# Patient Record
Sex: Male | Born: 1950 | Race: White | Hispanic: No | Marital: Single | Smoking: Never smoker
Health system: Southern US, Community
[De-identification: ages and names within clinical notes are randomized; demographics above are authoritative.]

## PROBLEM LIST (undated history)

## (undated) DIAGNOSIS — I10 Essential (primary) hypertension: Secondary | ICD-10-CM

## (undated) DIAGNOSIS — C61 Malignant neoplasm of prostate: Secondary | ICD-10-CM

## (undated) DIAGNOSIS — F329 Major depressive disorder, single episode, unspecified: Secondary | ICD-10-CM

## (undated) DIAGNOSIS — N401 Enlarged prostate with lower urinary tract symptoms: Secondary | ICD-10-CM

## (undated) DIAGNOSIS — N138 Other obstructive and reflux uropathy: Secondary | ICD-10-CM

## (undated) DIAGNOSIS — F419 Anxiety disorder, unspecified: Secondary | ICD-10-CM

## (undated) DIAGNOSIS — R32 Unspecified urinary incontinence: Secondary | ICD-10-CM

## (undated) DIAGNOSIS — N529 Male erectile dysfunction, unspecified: Secondary | ICD-10-CM

## (undated) DIAGNOSIS — Z87898 Personal history of other specified conditions: Secondary | ICD-10-CM

## (undated) DIAGNOSIS — N1330 Unspecified hydronephrosis: Secondary | ICD-10-CM

## (undated) DIAGNOSIS — F32A Depression, unspecified: Secondary | ICD-10-CM

## (undated) HISTORY — PX: TONSILLECTOMY: SUR1361

## (undated) HISTORY — DX: Major depressive disorder, single episode, unspecified: F32.9

## (undated) HISTORY — DX: Depression, unspecified: F32.A

---

## 2012-07-22 ENCOUNTER — Ambulatory Visit: Payer: Self-pay | Admitting: Family Medicine

## 2012-07-22 VITALS — BP 180/96 | HR 69 | Temp 97.8°F | Resp 16 | Ht 76.0 in | Wt 170.0 lb

## 2012-07-22 DIAGNOSIS — I10 Essential (primary) hypertension: Secondary | ICD-10-CM

## 2012-07-22 DIAGNOSIS — R350 Frequency of micturition: Secondary | ICD-10-CM

## 2012-07-22 DIAGNOSIS — Z125 Encounter for screening for malignant neoplasm of prostate: Secondary | ICD-10-CM

## 2012-07-22 DIAGNOSIS — R32 Unspecified urinary incontinence: Secondary | ICD-10-CM

## 2012-07-22 LAB — POCT URINALYSIS DIPSTICK
Bilirubin, UA: NEGATIVE
Blood, UA: NEGATIVE
Glucose, UA: NEGATIVE
Ketones, UA: NEGATIVE
Leukocytes, UA: NEGATIVE
Nitrite, UA: NEGATIVE
Protein, UA: NEGATIVE
Spec Grav, UA: 1.01
Urobilinogen, UA: 0.2
pH, UA: 7

## 2012-07-22 LAB — POCT UA - MICROSCOPIC ONLY
Casts, Ur, LPF, POC: NEGATIVE
Crystals, Ur, HPF, POC: NEGATIVE
Epithelial cells, urine per micros: NEGATIVE
Mucus, UA: NEGATIVE
RBC, urine, microscopic: NEGATIVE
WBC, Ur, HPF, POC: NEGATIVE
Yeast, UA: NEGATIVE

## 2012-07-22 MED ORDER — METOPROLOL TARTRATE 50 MG PO TABS: 50.0000 mg | ORAL_TABLET | Freq: Every day | ORAL | Status: DC

## 2012-07-22 MED ORDER — DOXAZOSIN MESYLATE 1 MG PO TABS: 1.0000 mg | ORAL_TABLET | Freq: Every day | ORAL | Status: DC

## 2012-07-22 NOTE — Progress Notes (Signed)
 [facility]:  Office Visit  Chief Complaint:  Chief Complaint  Patient presents with  . swollen prosate systoms  . wetting bed,pain after urination    HPI: Nathan Smith is a 61 y.o. male who complains of:  1. Thinks he may have BPH-increase frequency 5-6 x per night in several months, incomplete emptying, however flow is good, hesitancy when initiate urine. Has pelvic pain  fterwards, no dc; denies fevers, chills, night sweats, unintentional weightloss. OVerall sxs started x 1 year. No recent PSA.  Patient also has issues with erectile dysfunction, both erection and ejaculation.  2. HTN-drinks 5-6 wine daily, has a h/o elevated BP in the 140s and 150s in past without official dx. Denies CP, SOB, HA, dizziness, palpitations. USed to be heavier drinker. Denies alcoholism. CAGE questions-2/4  Past Medical History  Diagnosis Date  . Depression    Past Surgical History  Procedure Date  . Left elbow fx s/p repair    History   Social History  . Marital Status: Single    Spouse Name: N/A    Number of Children: N/A  . Years of Education: N/A   Social History Main Topics  . Smoking status: Never Smoker   . Smokeless tobacco: None  . Alcohol Use: 1.2 oz/week    2 Cans of beer per week  . Drug Use: No  . Sexually Active: Yes    Birth Control/ Protection: Condom   Other Topics Concern  . None   Social History Narrative  . None   History reviewed. No pertinent family history. No Known Allergies Prior to Admission medications   Not on File     ROS: The patient denies fevers, chills, night sweats, unintentional weight loss, chest pain, palpitations, wheezing, dyspnea on exertion, nausea, vomiting,  hematuria, melena, numbness, weakness, or tingling.   All other systems have been reviewed and were otherwise negative with the exception of those mentioned in the HPI and as above.    PHYSICAL EXAM: Filed Vitals:   07/22/12 1720  BP: 180/96  Pulse: 69   Temp: 97.8 F (36.6 C)  Resp: 16   Filed Vitals:   07/22/12 1720  Height: 6\' 4"  (1.93 m)  Weight: 170 lb (77.111 kg)   Body mass index is 20.69 kg/(m^2).  General: Alert, no acute distress HEENT:  Normocephalic, atraumatic, oropharynx patent.  Cardiovascular:  Regular rate and rhythm, no rubs murmurs or gallops.  No Carotid bruits, radial pulse intact. No pedal edema.  Respiratory: Clear to auscultation bilaterally.  No wheezes, rales, or rhonchi.  No cyanosis, no use of accessory musculature GI: No organomegaly, abdomen is soft and non-tender, positive bowel sounds.  No masses. Skin: No rashes. Neurologic: Facial musculature symmetric. Psychiatric: Patient is appropriate throughout our interaction. Lymphatic: No cervical lymphadenopathy. No thyroidmegaly Musculoskeletal: Gait intact. GU-hard enlarged non lumpy  prostate, ? tumor   LABS: Results for orders placed in visit on 07/22/12  POCT UA - MICROSCOPIC ONLY      Component Value Range   WBC, Ur, HPF, POC neg     RBC, urine, microscopic neg     Bacteria, U Microscopic eng     Mucus, UA neg     Epithelial cells, urine per micros neg     Crystals, Ur, HPF, POC neg     Casts, Ur, LPF, POC neg     Yeast, UA neg    POCT URINALYSIS DIPSTICK      Component Value Range   Color, UA  yellow     Clarity, UA clear     Glucose, UA neg     Bilirubin, UA neg     Ketones, UA neg     Spec Grav, UA 1.010     Blood, UA neg     pH, UA 7.0     Protein, UA neg     Urobilinogen, UA 0.2     Nitrite, UA neg     Leukocytes, UA Negative       EKG/XRAY:   Primary read interpreted by Dr. Con[facility]s at [facility].   ASSESSMENT/PLAN: Encounter Diagnoses  Name Primary?  . Incontinence Yes  . Urinary frequency   . Screening for prostate cancer   . HTN (hypertension)    Nathan Smith, thin 61 y/o white male who is here for BPH sxs that have worsen in the last 1 year. 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 He has been maintaining a well balanced diet and exercise regiment and has not been to a physician in many years, >10. He has a h/o elevated BP without diagnosis, used to drink ETOH heavily but is now down to only 5-6 glasses of wine daily. Since he is uninsured I will try to put him on medications that are on the $4 plan and also do not require frequent labs. Today on DRE I found a enlarged smooth masslike prostate without any globulations.  Will rx metoprolol 50 mg daily for BP, goal is < 140/90. Moniotr for Ses ie chestpain, dizziness, fatigue, bradycardia Will rx cardura for possible BPH sxs. Perhaps finasteride is also needed but I will await PSA result before adding this one, it takes a few months to start working anyhow F/u in 2 days for PSA result if he has not heard from [place], prostate exam on DRE was hard, not typical BPH in texture, will await PSA, if high then refer to urology if normal then consider TRUS F/u in 2-4 weeks with HTN BP and pulse logs. Advise to decrease alcohol intake which may contribute to elevated BP.    ,  PHUONG, DO 07/23/2012 10:39 AM

## 2012-07-23 ENCOUNTER — Encounter: Payer: Self-pay | Admitting: Family Medicine

## 2012-07-23 DIAGNOSIS — I10 Essential (primary) hypertension: Secondary | ICD-10-CM | POA: Insufficient documentation

## 2012-07-23 LAB — PSA: PSA: 78.6 ng/mL — ABNORMAL HIGH (ref <=4.00)

## 2012-07-24 ENCOUNTER — Other Ambulatory Visit: Payer: Self-pay | Admitting: Family Medicine

## 2012-07-24 ENCOUNTER — Telehealth: Payer: Self-pay | Admitting: Family Medicine

## 2012-07-24 ENCOUNTER — Encounter: Payer: Self-pay | Admitting: Family Medicine

## 2012-07-24 DIAGNOSIS — R972 Elevated prostate specific antigen [PSA]: Secondary | ICD-10-CM

## 2012-07-24 NOTE — Telephone Encounter (Signed)
Lm to call me back about PSA.

## 2012-08-29 ENCOUNTER — Telehealth: Payer: Self-pay | Admitting: Family Medicine

## 2012-08-29 NOTE — Telephone Encounter (Signed)
Spoke with [name]. He was feeling lethargic so we stopped the metroprolol. He was having increase frequency with the cardura so he is only taking 1/6 of the pill. His incontinence is worse. He has not seen urology due to cost, trying to go through [facility] program. He is waiting for that, He rescheduled with [facility] for February 11. I will go ahead an prescribe him Detrol LA 2 mg to see if this will help with his incontinence and he will still continue with cardura. Will send rx through mail with coupon.

## 2012-09-04 ENCOUNTER — Other Ambulatory Visit: Payer: Self-pay | Admitting: Family Medicine

## 2012-09-04 DIAGNOSIS — R32 Unspecified urinary incontinence: Secondary | ICD-10-CM

## 2012-09-04 MED ORDER — TOLTERODINE TARTRATE ER 2 MG PO CP24: 2.0000 mg | ORAL_CAPSULE | Freq: Every day | ORAL | Status: DC

## 2012-09-04 NOTE — Progress Notes (Signed)
Spoke with patient on multiple occasions regarding elevated PSA. D/w him that it needs to be evaluated by urology for possible cancer, he does not have insurance and is awaiting for insurance help through [facility]. I will try to alleviate his incontinece with a trial of detrol but he really needs to see a urologist. I cannot emphasize that enough.

## 2012-09-17 ENCOUNTER — Ambulatory Visit (INDEPENDENT_AMBULATORY_CARE_PROVIDER_SITE_OTHER): Payer: Self-pay | Admitting: Urology

## 2012-09-17 ENCOUNTER — Other Ambulatory Visit: Payer: Self-pay | Admitting: Urology

## 2012-09-17 DIAGNOSIS — N3942 Incontinence without sensory awareness: Secondary | ICD-10-CM

## 2012-09-17 DIAGNOSIS — R972 Elevated prostate specific antigen [PSA]: Secondary | ICD-10-CM

## 2012-09-17 DIAGNOSIS — N320 Bladder-neck obstruction: Secondary | ICD-10-CM

## 2012-09-24 ENCOUNTER — Ambulatory Visit (HOSPITAL_COMMUNITY)
Admission: RE | Admit: 2012-09-24 | Discharge: 2012-09-24 | Disposition: A | Discharge status: Home / Self Care | Payer: Self-pay | Source: Ambulatory Visit | Attending: Urology | Admitting: Urology

## 2012-09-24 ENCOUNTER — Other Ambulatory Visit: Payer: Self-pay | Admitting: Urology

## 2012-09-24 DIAGNOSIS — C61 Malignant neoplasm of prostate: Secondary | ICD-10-CM

## 2012-09-24 DIAGNOSIS — R972 Elevated prostate specific antigen [PSA]: Secondary | ICD-10-CM

## 2012-09-24 HISTORY — DX: Malignant neoplasm of prostate: C61

## 2012-09-24 HISTORY — PX: PROSTATE BIOPSY: SHX241

## 2012-09-24 MED ORDER — LIDOCAINE HCL (PF) 2 % IJ SOLN
INTRAMUSCULAR | Status: AC
  Filled 2012-09-24: qty 10

## 2012-09-24 NOTE — Progress Notes (Signed)
Lidocaine 1%         10mL injected                       Transrectal prostate biopsies performed 

## 2012-09-30 ENCOUNTER — Other Ambulatory Visit: Payer: Self-pay | Admitting: Urology

## 2012-09-30 DIAGNOSIS — C61 Malignant neoplasm of prostate: Secondary | ICD-10-CM

## 2012-10-01 ENCOUNTER — Encounter: Payer: Self-pay | Admitting: Urology

## 2012-10-11 ENCOUNTER — Encounter (HOSPITAL_COMMUNITY): Payer: Self-pay

## 2012-10-11 ENCOUNTER — Ambulatory Visit (HOSPITAL_COMMUNITY): Payer: Self-pay

## 2012-10-16 ENCOUNTER — Encounter (HOSPITAL_COMMUNITY): Payer: Self-pay

## 2012-10-16 ENCOUNTER — Ambulatory Visit (HOSPITAL_COMMUNITY): Payer: Self-pay

## 2012-10-16 ENCOUNTER — Other Ambulatory Visit (HOSPITAL_COMMUNITY): Payer: Self-pay

## 2012-10-21 ENCOUNTER — Ambulatory Visit (HOSPITAL_COMMUNITY)
Admission: RE | Admit: 2012-10-21 | Discharge: 2012-10-21 | Disposition: A | Discharge status: Home / Self Care | Payer: Self-pay | Source: Ambulatory Visit | Attending: Urology | Admitting: Urology

## 2012-10-21 ENCOUNTER — Encounter (HOSPITAL_COMMUNITY)
Admission: RE | Admit: 2012-10-21 | Discharge: 2012-10-21 | Disposition: A | Discharge status: Home / Self Care | Payer: Self-pay | Source: Ambulatory Visit | Attending: Urology | Admitting: Urology

## 2012-10-21 ENCOUNTER — Other Ambulatory Visit: Payer: Self-pay | Admitting: Oncology

## 2012-10-21 ENCOUNTER — Other Ambulatory Visit: Payer: Self-pay | Admitting: Urology

## 2012-10-21 ENCOUNTER — Encounter (HOSPITAL_COMMUNITY): Admission: RE | Admit: 2012-10-21 | Payer: Self-pay | Source: Ambulatory Visit

## 2012-10-21 DIAGNOSIS — C61 Malignant neoplasm of prostate: Secondary | ICD-10-CM | POA: Insufficient documentation

## 2012-10-21 DIAGNOSIS — N1330 Unspecified hydronephrosis: Secondary | ICD-10-CM | POA: Insufficient documentation

## 2012-10-21 DIAGNOSIS — R188 Other ascites: Secondary | ICD-10-CM | POA: Insufficient documentation

## 2012-10-21 MED ORDER — IOHEXOL 300 MG/ML  SOLN
50.0000 mL | Freq: Once | INTRAMUSCULAR | Status: AC | PRN
  Administered 2012-10-21: 50 mL via ORAL

## 2012-10-21 MED ORDER — TECHNETIUM TC 99M MEDRONATE IV KIT
25.0000 | PACK | Freq: Once | INTRAVENOUS | Status: AC | PRN
  Administered 2012-10-21: 25 via INTRAVENOUS

## 2012-10-21 MED ORDER — IOHEXOL 300 MG/ML  SOLN
100.0000 mL | Freq: Once | INTRAMUSCULAR | Status: AC | PRN
  Administered 2012-10-21: 100 mL via INTRAVENOUS

## 2012-10-22 ENCOUNTER — Institutional Professional Consult (permissible substitution) (INDEPENDENT_AMBULATORY_CARE_PROVIDER_SITE_OTHER): Payer: Self-pay | Admitting: Urology

## 2012-10-22 DIAGNOSIS — N320 Bladder-neck obstruction: Secondary | ICD-10-CM

## 2012-10-22 DIAGNOSIS — C61 Malignant neoplasm of prostate: Secondary | ICD-10-CM

## 2012-10-22 DIAGNOSIS — N1330 Unspecified hydronephrosis: Secondary | ICD-10-CM

## 2012-10-22 DIAGNOSIS — N312 Flaccid neuropathic bladder, not elsewhere classified: Secondary | ICD-10-CM

## 2012-10-28 ENCOUNTER — Other Ambulatory Visit: Payer: Self-pay | Admitting: Family Medicine

## 2012-10-28 DIAGNOSIS — R350 Frequency of micturition: Secondary | ICD-10-CM

## 2012-10-28 MED ORDER — SILODOSIN 8 MG PO CAPS: 8.0000 mg | ORAL_CAPSULE | Freq: Every day | ORAL | Status: DC

## 2012-11-04 ENCOUNTER — Encounter: Payer: Self-pay | Admitting: Radiation Oncology

## 2012-11-04 NOTE — Progress Notes (Addendum)
New Consult Prostate Cancer dx Biopsy : 09/24/12 Adenocarcinoma,gleason 4+3=7,& 4+4=8,PSA=67.30, Volume=33.7cc PSA 07/23/12=78.60 On Rapaflo [name],, Single, no children,  Alert,oriented x3, frequency,urgency, not emptying bladder fully,, weak stream,nocturia, no dysuria, bowels regular, no nausea, very anxious/nervous I-PSS=27  Allergies: NKDA  No History of Radiation No History of a Pacemaker

## 2012-11-06 ENCOUNTER — Ambulatory Visit
Admission: RE | Admit: 2012-11-06 | Discharge: 2012-11-06 | Disposition: A | Discharge status: Home / Self Care | Payer: Self-pay | Source: Ambulatory Visit | Attending: Radiation Oncology | Admitting: Radiation Oncology

## 2012-11-06 ENCOUNTER — Encounter: Payer: Self-pay | Admitting: Radiation Oncology

## 2012-11-06 VITALS — BP 181/90 | HR 60 | Temp 97.8°F | Resp 20 | Ht 76.0 in | Wt 160.5 lb

## 2012-11-06 DIAGNOSIS — C61 Malignant neoplasm of prostate: Secondary | ICD-10-CM | POA: Insufficient documentation

## 2012-11-06 DIAGNOSIS — I10 Essential (primary) hypertension: Secondary | ICD-10-CM | POA: Insufficient documentation

## 2012-11-06 HISTORY — DX: Essential (primary) hypertension: I10

## 2012-11-06 HISTORY — DX: Benign prostatic hyperplasia with lower urinary tract symptoms: N40.1

## 2012-11-06 HISTORY — DX: Malignant neoplasm of prostate: C61

## 2012-11-06 HISTORY — DX: Personal history of other specified conditions: Z87.898

## 2012-11-06 HISTORY — DX: Unspecified hydronephrosis: N13.30

## 2012-11-06 HISTORY — DX: Anxiety disorder, unspecified: F41.9

## 2012-11-06 HISTORY — DX: Unspecified urinary incontinence: R32

## 2012-11-06 HISTORY — DX: Male erectile dysfunction, unspecified: N52.9

## 2012-11-06 HISTORY — DX: Other obstructive and reflux uropathy: N13.8

## 2012-11-06 NOTE — Progress Notes (Signed)
Please see the Nurse Progress Note in the MD Initial Consult Encounter for this patient. 

## 2012-11-06 NOTE — Progress Notes (Signed)
Radiation Oncology         [phone]) [phone] ________________________________  Initial outpatient Consultation  Name: Nathan Smith MRN: [id number]  Date: 11/06/2012  DOB: 1951/05/06  CC:Pcp Not In System  Marcine Matar, MD   REFERRING PHYSICIAN: Marcine Matar, MD  DIAGNOSIS: 62 y.o. 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 gentleman.  He was noted to have an elevated PSA of 78 by [facility], Dr. Conley Rolls.  Accordingly, he was referred for evaluation in urology by Dr. Retta Diones on 09/17/12,  digital rectal examination was performed at that time revealing a 4+ prostate with palpable nodularity throughout the gland and into the seminal vesicles.  Repeat PSA was 67.  The patient proceeded to transrectal ultrasound with 12 biopsies of the prostate on 09/24/12.  The prostate volume measured 33.7 cc.  Out of 12 core biopsies, 12 were positive.  The maximum Gleason score was 4+4, and this was seen in right seminal vesicle.  The other cores all showed Gleason's 4+3  Bone scan and CT Pelvis show no overt metastases.  The patient reviewed the biopsy results with his urologist and he has kindly been referred today for discussion of potential radiation treatment options.  PREVIOUS RADIATION THERAPY: No  PAST MEDICAL HISTORY:  has a past medical history of Depression; Prostate cancer (09/24/12); Hypertension; Anxiety; H/O ascites; BPH (benign prostatic hypertrophy) with urinary obstruction; ED (erectile dysfunction); Bladder incontinence; and Bilateral hydronephrosis.    PAST SURGICAL HISTORY: Past Surgical History  Procedure Laterality Date  . Tonsillectomy    . Prostate biopsy  09/24/12    Adenocarcinoma    FAMILY HISTORY: family history is not on file.  SOCIAL HISTORY:  reports that he has never smoked. He has never used smokeless tobacco. He reports that he drinks about 1.2  ounces of alcohol per week. He reports that he does not use illicit drugs.  ALLERGIES: Review of patient's allergies indicates no known allergies.  MEDICATIONS:  Current Outpatient Prescriptions  Medication Sig Dispense Refill  . aspirin 325 MG tablet Take 325 mg by mouth daily.      . L-ARGININE PO Take 500 mg by mouth daily.       Marland Kitchen METOPROLOL TARTRATE PO Take 50 mg by mouth as needed (takes 1/5 tab prn).       . Misc Natural Products (GINSENG COMPLEX PO) Take by mouth.      . silodosin (RAPAFLO) 8 MG CAPS capsule Take 1 capsule (8 mg total) by mouth daily with breakfast.  30 capsule  3  . Zn-Pyg Afri-Nettle-Saw Palmet (SAW PALMETTO COMPLEX PO) Take by mouth.      . doxazosin (CARDURA) 2 MG tablet Take 2 mg by mouth at bedtime.      . tolterodine (DETROL) 2 MG tablet Take 2 mg by mouth daily.       No current facility-administered medications for this encounter.    REVIEW OF SYSTEMS:  A 15 point review of systems is documented in the electronic medical record. This was obtained by the nursing staff. However, I reviewed this with the patient to discuss relevant findings and make appropriate changes.  A comprehensive review of systems was negative..  The patient completed an IPSS and IIEF questionnaire.  His IPSS score was 27 indicating severe urinary outflow obstructive symptoms.  He indicated that his erectile function is capable to complete sexual activity, but,  he has not had a partner recently.   PHYSICAL EXAM: This patient is in no acute distress.  He is alert and oriented.   height is 6\' 4"  (1.93 m) and weight is 160 lb 8 oz (72.802 kg). His oral temperature is 97.8 F (36.6 C). His blood pressure is 181/90 and his pulse is 60. His respiration is 20.  He exhibits no respiratory distress or labored breathing.  He appears neurologically intact.  His mood is pleasant.  His affect is appropriate.  Please note the digital rectal exam findings described above.  LABORATORY DATA:  No results  found for this basename: WBC, HGB, HCT, MCV, PLT   No results found for this basename: NA, K, CL, CO2   No results found for this basename: ALT, AST, GGT, ALKPHOS, BILITOT     RADIOGRAPHY: Nm Bone Scan Whole Body  10/21/2012  *RADIOLOGY REPORT*  Clinical Data: New diagnosis prostate cancer.  PSA 78.6.  NUCLEAR MEDICINE WHOLE BODY BONE SCINTIGRAPHY  Technique:  Whole body anterior and posterior images were obtained approximately 3 hours after intravenous injection of radiopharmaceutical.  Radiopharmaceutical: CURIE TC-MDP TECHNETIUM TC 70M MEDRONATE IV KIT  Comparison: CT abdomen and pelvis this same day reviewed.  Findings: No abnormal osseous uptake to suggest metastatic disease is identified.  Soft tissue uptake demonstrates bilateral hydronephrosis and.  The urinary bladder is distended.  IMPRESSION:  1.  Negative for osseous metastatic disease. 2.  Bilateral hydronephrosis and distended urinary bladder compatible with bladder outlet obstruction.   Original Report Authenticated By: Holley Dexter, M.D.    Ct Abdomen Pelvis W Contrast  10/21/2012  *RADIOLOGY REPORT*  Clinical Data: History of prostate cancer.  CT ABDOMEN AND PELVIS WITH CONTRAST  Technique:  Multidetector CT imaging of the abdomen and pelvis was performed following the standard protocol during bolus administration of intravenous contrast.  Contrast: 50mL OMNIPAQUE IOHEXOL 300 MG/ML  SOLN, OMNIPAQUE IOHEXOL 300 MG/ML  SOLN  Comparison: No priors.  Findings:  Lung Bases: Unremarkable.  Abdomen/Pelvis:  The appearance of the liver, gallbladder, pancreas, spleen and bilateral adrenal glands is unremarkable. Normal appendix.  Trace ascites. No pneumoperitoneum.  No pathologic distension of small bowel.  No definite pathologic lymphadenopathy identified within the abdomen or pelvis.  There is moderate to severe bilateral hydroureteronephrosis. Delayed images demonstrates delayed excretion of contrast by the kidneys  bilaterally, and layering of contrast within very dilated collecting systems bilaterally.  Urinary bladder is profoundly dilated (despite report of patient urinating before the examination from the technologist) measuring approximately 13.7 x 12.8 x 17.1 cm.  Despite this markedly distended urinary bladder, the urinary bladder wall appears thickened.  Prostate gland appears enlarged and has heterogeneous internal enhancement, with profound median lobe hypertrophy.  Musculoskeletal: There are no aggressive appearing lytic or blastic lesions noted in the visualized portions of the skeleton.  IMPRESSION: 1.  Markedly enlarged urinary bladder (estimated volume of nearly 1.5 liters) with moderate to severe bilateral hydroureteronephrosis, presumably related to severe bladder outlet obstruction related to the patient's known prostate cancer. 2.  No definite findings to suggest metastatic disease in the abdomen or pelvis. 3.  Trace volume of ascites.  These results will be called to the ordering clinician or representative by the Radiologist Assistant, and communication documented in the PACS Dashboard.   Original Report Authenticated By: Trudie Reed, M.D.       IMPRESSION: This gentleman is a 62 y.o. gentleman with stage T3 adenocarcinoma of the prostate with a Gleason's score of  4+4 and a PSA of 67.  His T-Stage, Gleason's Score, and PSA put him into the high risk group.  Accordingly he is eligible for a variety of potential treatment options including prolonged androgen deprivation with radiotherapy.  PLAN:Today I reviewed the findings and workup thus far.  We discussed the natural history of prostate cancer.  We reviewed the the implications of T-stage, Gleason's Score, and PSA on decision-making and outcomes in prostate cancer.  We discussed radiation treatment in the management of prostate cancer with regard to the logistics and delivery of external beam radiation treatment as well as the logistics and  delivery of prostate brachytherapy.  We compared and contrasted each of these approaches and also compared these against prostatectomy.  The patient expressed interest in external beam radiotherapy.  I filled out a patient counseling form for him with relevant treatment diagrams and we retained a copy for our records.   The patient had many questions ranging over a variety of topics including holistic alternatives to medical care, and predictions about outcomes if he decides to pursue no treatment at all. I clearly indicated to the patient that he has high-risk disease which will likely become fatal within the next few years if he does not pursue aggressive medical care. That being said, I also provided him with realistic projections of cure rates for patients with high-risk disease.  The patient could potentially proceed with prostate IMRT in conjunction with 2-3 years androgen deprivation. He also would be well-suited for consideration to enroll onto a clinical trial for high-risk patients given his young age and high risk status. At this time, the patient does not wish to make any decision whatsoever. He would like to "sleep on it.".  I will share my findings with Dr. Retta Diones.  If the patient elects to proceed with androgen deprivation now I would recommend 2 months of neoadjuvant androgen deprivation followed by placement of 3 gold markers and CT simulation for intensity modulated radiotherapy. If he would like to pursue a second opinion after initiation of hormone therapy or before, he could potentially be seen at [facility].   During our conversation today, I offered to refer the patient to medical oncology at [facility] and proceed with referral back to urology for androgen deprivation. However, as above, patient would like to think things over. I provided him with my office phone number including my direct voicemail number.   I enjoyed meeting with him today, and will look forward to  participating in the care of this very nice gentleman.   I spent 60 minutes face to face with the patient and more than 50% of that time was spent in counseling and/or coordination of care.   ------------------------------------------------  Artist Pais. Kathrynn Running, M.D.

## 2012-11-07 ENCOUNTER — Encounter: Payer: Self-pay | Admitting: Radiation Oncology

## 2012-11-19 ENCOUNTER — Encounter: Payer: Self-pay | Admitting: Family Medicine

## 2012-11-19 DIAGNOSIS — C61 Malignant neoplasm of prostate: Secondary | ICD-10-CM | POA: Insufficient documentation

## 2012-11-19 DIAGNOSIS — N320 Bladder-neck obstruction: Secondary | ICD-10-CM | POA: Insufficient documentation

## 2012-12-10 ENCOUNTER — Telehealth: Payer: Self-pay | Admitting: Radiology

## 2012-12-10 NOTE — Telephone Encounter (Signed)
Patient asking about Rapaflo patient assistance meds, I have no records of this, do you know anything about this?

## 2012-12-11 NOTE — Telephone Encounter (Signed)
Thanks, Gerarda Gunther located it, was in pick up drawer the entire time, patient will come in for this tomorrow and ask for me

## 2012-12-11 NOTE — Telephone Encounter (Signed)
I gave it to one of my assistants I don't remember whom to call him because the samples were in and he should pick it up. This was last week. Could it be in the drawer where you guys keep everything up front ie letters,lost and found items, ect. Was it not documented that she called?   No, there is nothing in the chart patient does not know who called and there is nothing in the pick up drawer.

## 2012-12-17 ENCOUNTER — Ambulatory Visit (INDEPENDENT_AMBULATORY_CARE_PROVIDER_SITE_OTHER): Payer: Self-pay | Admitting: Urology

## 2012-12-17 DIAGNOSIS — N320 Bladder-neck obstruction: Secondary | ICD-10-CM

## 2012-12-17 DIAGNOSIS — C61 Malignant neoplasm of prostate: Secondary | ICD-10-CM

## 2012-12-17 DIAGNOSIS — N1330 Unspecified hydronephrosis: Secondary | ICD-10-CM

## 2013-01-06 ENCOUNTER — Other Ambulatory Visit: Payer: Self-pay | Admitting: Family Medicine

## 2013-01-06 DIAGNOSIS — I10 Essential (primary) hypertension: Secondary | ICD-10-CM

## 2013-01-06 MED ORDER — METOPROLOL SUCCINATE ER 25 MG PO TB24: ORAL_TABLET | ORAL | Status: DC

## 2013-01-14 ENCOUNTER — Ambulatory Visit: Payer: Self-pay | Admitting: Urology

## 2013-01-17 ENCOUNTER — Ambulatory Visit (INDEPENDENT_AMBULATORY_CARE_PROVIDER_SITE_OTHER): Payer: Self-pay | Admitting: Urology

## 2013-01-17 DIAGNOSIS — C61 Malignant neoplasm of prostate: Secondary | ICD-10-CM

## 2013-01-24 ENCOUNTER — Other Ambulatory Visit: Payer: Self-pay | Admitting: Urology

## 2013-01-24 DIAGNOSIS — N1330 Unspecified hydronephrosis: Secondary | ICD-10-CM

## 2013-01-28 ENCOUNTER — Other Ambulatory Visit: Payer: Self-pay | Admitting: Radiology

## 2013-01-28 DIAGNOSIS — R350 Frequency of micturition: Secondary | ICD-10-CM

## 2013-01-28 MED ORDER — SILODOSIN 8 MG PO CAPS: 8.0000 mg | ORAL_CAPSULE | Freq: Every day | ORAL | Status: DC

## 2013-01-28 NOTE — Telephone Encounter (Signed)
Pended Rapaflo please advise, needs to be faxed to the patient assistance program [phone] this will print, will need to be signed.

## 2013-02-11 ENCOUNTER — Telehealth: Payer: Self-pay

## 2013-02-11 DIAGNOSIS — R350 Frequency of micturition: Secondary | ICD-10-CM

## 2013-02-11 NOTE — Telephone Encounter (Signed)
THIS MESSAGE IS FROM THE [employer] [facility]. THEY STATE MR. Matney NEEDS A REFILL ON HIS RAPAFLOW 8MG  FROM DR. LE. A PRESCRIPTION NEEDS TO BE FAXED TO THEM SO THAT IT CAN BE FILLED. BEST PHONE [phone]     FAX IS [phone]     MBC

## 2013-02-12 MED ORDER — SILODOSIN 8 MG PO CAPS: 8.0000 mg | ORAL_CAPSULE | Freq: Every day | ORAL | Status: DC

## 2013-02-12 NOTE — Telephone Encounter (Signed)
Faxed to new fax number provided. Was sent to different number on 6/24 apparently they did not get this

## 2013-02-25 ENCOUNTER — Encounter: Payer: Self-pay | Admitting: Family Medicine

## 2013-03-17 ENCOUNTER — Ambulatory Visit (HOSPITAL_COMMUNITY)
Admission: RE | Admit: 2013-03-17 | Discharge: 2013-03-17 | Disposition: A | Discharge status: Home / Self Care | Payer: Self-pay | Source: Ambulatory Visit | Attending: Urology | Admitting: Urology

## 2013-03-17 DIAGNOSIS — N1330 Unspecified hydronephrosis: Secondary | ICD-10-CM | POA: Insufficient documentation

## 2013-03-17 DIAGNOSIS — N400 Enlarged prostate without lower urinary tract symptoms: Secondary | ICD-10-CM | POA: Insufficient documentation

## 2013-03-17 DIAGNOSIS — N3289 Other specified disorders of bladder: Secondary | ICD-10-CM | POA: Insufficient documentation

## 2013-03-24 ENCOUNTER — Other Ambulatory Visit (HOSPITAL_COMMUNITY): Payer: Self-pay

## 2013-03-25 ENCOUNTER — Ambulatory Visit (INDEPENDENT_AMBULATORY_CARE_PROVIDER_SITE_OTHER): Payer: Self-pay | Admitting: Urology

## 2013-03-25 DIAGNOSIS — N1330 Unspecified hydronephrosis: Secondary | ICD-10-CM

## 2013-03-25 DIAGNOSIS — N320 Bladder-neck obstruction: Secondary | ICD-10-CM

## 2013-03-25 DIAGNOSIS — C61 Malignant neoplasm of prostate: Secondary | ICD-10-CM

## 2013-03-27 ENCOUNTER — Telehealth: Payer: Self-pay | Admitting: Family Medicine

## 2013-03-27 NOTE — Telephone Encounter (Signed)
LM to see how he is doing, BP etc, got last OV note form Dr. Retta Diones.

## 2013-03-28 ENCOUNTER — Telehealth: Payer: Self-pay | Admitting: Radiology

## 2013-03-28 NOTE — Telephone Encounter (Signed)
BP reads have been ok.  140/92 3 days ago.  Patient is feeling good "for someone who has cancer".

## 2013-04-02 ENCOUNTER — Other Ambulatory Visit: Payer: Self-pay

## 2013-04-02 DIAGNOSIS — R350 Frequency of micturition: Secondary | ICD-10-CM

## 2013-04-02 NOTE — Telephone Encounter (Signed)
LMOM for pt to CB to advise whether Rapaflo Rx needs to be sent to [facility], or faxed to number on previous Rx.

## 2013-04-02 NOTE — Telephone Encounter (Signed)
Spoke with pt and he does not need more at this time. He gets med through pt asst prog and they send it to [place]. We had sent Rx in July but it never was received under his name. Dr Conley Rolls found a bottle that was probably meant for him and we have left it in drawer for p/up. Pt will call back a couple of weeks before he needs next RF.

## 2013-04-08 ENCOUNTER — Encounter: Payer: Self-pay | Admitting: Radiation Oncology

## 2013-04-08 NOTE — Progress Notes (Signed)
GU Location of Tumor / Histology: adenocarcinoma of the prostate  If Prostate Cancer, Gleason Score is (4 + 4=8) and PSA is (67)  Patient presented 09/17/2012 with elevated PSA and significant lower urinary tract symptoms.   Biopsies of prostate (if applicable) revealed:     Past/Anticipated interventions by urology, if any: Administered first Firmagon on 12/17/2012; received first 6 month Lupron on 01/17/2013  Past/Anticipated interventions by medical oncology, if any: None  Weight changes, if any: None noted  Bowel/Bladder complaints, if any: outlet obstruction, large residual urine volume, hydronephrosis, poor emptying, decreased libido, nocturia, weak urine stream, urine stream starts and stops, decreased libido and erectile dysfunction   Nausea/Vomiting, if any: None noted  Pain issues, if any:  None noted  SAFETY ISSUES:  Prior radiation? NO  Pacemaker/ICD? NO  Possible current pregnancy? NO  Is the patient on methotrexate? 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 Also, c/o diarrhea, feeling tired, and hot flashes. Dr. Retta Diones plans to see patient back in November for next Lupron. Patient continues to start self catheterization.

## 2013-04-09 ENCOUNTER — Ambulatory Visit
Admission: RE | Admit: 2013-04-09 | Discharge: 2013-04-09 | Disposition: A | Discharge status: Home / Self Care | Payer: Self-pay | Source: Ambulatory Visit | Attending: Radiation Oncology | Admitting: Radiation Oncology

## 2013-04-09 ENCOUNTER — Encounter: Payer: Self-pay | Admitting: Radiation Oncology

## 2013-04-09 VITALS — BP 126/87 | HR 61 | Temp 98.0°F | Resp 16 | Ht 76.0 in | Wt 151.2 lb

## 2013-04-09 DIAGNOSIS — C61 Malignant neoplasm of prostate: Secondary | ICD-10-CM

## 2013-04-09 NOTE — Progress Notes (Signed)
Reports that on average he gets up 3 times per night to void down from 7 or 8 times per night. Reports frequent episodes of diarrhea but, denies pain associated with bowel movements or blood in stool. Denies dysuria or hematuria. Reports weak urine stream has greatly improved but, hasn't return back to a strong stream. Reports urine stream is steady until toward the end when it stops and starts. Reports that he stopped taking proscar after only two weeks because of potential permanent sexual side effects. Reports decreased libido and erectile dysfunction. Reports approximate weight loss of 15 lb since the beginning of the year. Reports mild hot flashes associated with lupron.

## 2013-04-09 NOTE — Progress Notes (Signed)
Radiation Oncology         [phone]) [phone] ________________________________  Name: Lukka Black MRN: [id number]  Date: 04/09/2013  DOB: 04/15/1951  Follow-Up Visit Note  CC: No PCP Per Patient  Marcine Matar, MD  Diagnosis:   62 y.o. 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  He is tolerating hormonal therapy, and has been referred back for discussion of radiotherapy.                              ALLERGIES:  has No Known Allergies.  Meds: Current Outpatient Prescriptions  Medication Sig Dispense Refill  . aspirin 325 MG tablet Take 325 mg by mouth daily.      . L-ARGININE PO Take 500 mg by mouth daily.       Marland Kitchen Leuprolide Acetate, 6 Month, (LUPRON DEPOT) 45 MG injection Inject 45 mg into the muscle every 6 (six) months.      . metoprolol succinate (TOPROL XL) 25 MG 24 hr tablet Take 1/2 tab PO daily  30 tablet  3  . Misc Natural Products (GINSENG COMPLEX PO) Take by mouth.      . silodosin (RAPAFLO) 8 MG CAPS capsule Take 1 capsule (8 mg total) by mouth daily with breakfast.  90 capsule  0  . Zn-Pyg Afri-Nettle-Saw Palmet (SAW PALMETTO COMPLEX PO) Take by mouth.      . finasteride (PROSCAR) 5 MG tablet Take 5 mg by mouth daily.       No current facility-administered medications for this encounter.    Physical Findings: The patient is in no acute distress. Patient is alert and oriented.  height is 6\' 4"  (1.93 m) and weight is 151 lb 3.2 oz (68.584 kg). His oral temperature is 98 F (36.7 C). His blood pressure is 126/87 and his pulse is 61. His respiration is 16 and oxygen saturation is 100%. .  No significant changes.  Lab Findings: No results found for this basename: WBC, HGB, HCT, MCV, PLT    Radiographic Findings: US Renal  03/17/2013   *RADIOLOGY REPORT*  Clinical Data: Follow-up hydronephrosis.  RENAL/URINARY  TRACT ULTRASOUND COMPLETE  Comparison:  Abdominal pelvic CT 10/21/2012.  Findings:  Right Kidney:  There is moderate to marked chronic hydronephrosis, similar to prior CT.  Renal length 11.4 cm.  There is mild cortical thinning.  No focal cortical abnormality identified.  Left Kidney:  There is moderate to marked chronic hydronephrosis, similar to prior CT.  Renal length 10.8 cm.  There is mild cortical thinning.  No focal cortical abnormality identified.  Bladder:  The urinary bladder is distended and thick-walled.  There are possible early diverticula.  Prevoid volume is 424 ml. Postvoid, the bladder volume is not decreased, measured at 448 ml. The prostate gland appears only mildly enlarged with an estimated volume of 50 ml.  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  Appearance is similar to CT performed 5 months ago.   Original Report Authenticated By: Carey Bullocks, M.D.    Impression:  The patient is receiving neoadjuvant total androgen blockade and he may benefit from IMRT to the prostate.  Plan:  Today I reviewed the findings and workup thus far. We discussed the natural history of prostate  cancer. We reviewed the the implications of T-stage, Gleason's Score, and PSA on decision-making and outcomes in prostate cancer. We discussed radiation treatment in the management of prostate cancer with regard to the logistics and delivery of external beam radiation treatment as well as the logistics and delivery of prostate brachytherapy. We compared and contrasted each of these approaches and also compared these against prostatectomy. The patient expressed interest in external beam radiotherapy. I reviewed his previous patient counseling form for him with relevant treatment diagrams and from the copy in our records.   The patient is ready to proceed with prostate IMRT in conjunction with androgen  deprivation.  I will share my findings with Dr. Retta Diones for placement of 3 gold markers and CT simulation for intensity modulated radiotherapy.  I enjoyed seeing him back today, and will look forward to participating in the care of this very nice gentleman.   I spent 30 minutes face to face with the patient and more than 50% of that time was spent in counseling and/or coordination of care.  _____________________________________  Artist Pais. Kathrynn Running, M.D.

## 2013-04-09 NOTE — Progress Notes (Signed)
See progress note under physician encounter. 

## 2013-04-09 NOTE — Addendum Note (Signed)
Encounter addended by: Agnes Lawrence, RN on: 04/09/2013  4:10 PM<BR>     Documentation filed: Inpatient Patient Education, Inpatient Document Flowsheet, Charges VN

## 2013-04-15 ENCOUNTER — Telehealth: Payer: Self-pay | Admitting: *Deleted

## 2013-04-15 NOTE — Telephone Encounter (Signed)
CALLED PATIENT TO INFORM OF SIM FOR 05-16-13 AT 2:00 PM AT DR. MANNING'S OFFICE, LVM FOR A RETURN CALL, GOLD SEEDS TO BE PLACED ON 05-09-13 AT 9:15 AM AT DR. DAHLSTEDT'S OFFICE.

## 2013-04-17 ENCOUNTER — Other Ambulatory Visit: Payer: Self-pay | Admitting: Urology

## 2013-04-17 DIAGNOSIS — C61 Malignant neoplasm of prostate: Secondary | ICD-10-CM

## 2013-04-25 ENCOUNTER — Other Ambulatory Visit: Payer: Self-pay | Admitting: Family Medicine

## 2013-04-25 DIAGNOSIS — R350 Frequency of micturition: Secondary | ICD-10-CM

## 2013-04-25 MED ORDER — SILODOSIN 8 MG PO CAPS: 8.0000 mg | ORAL_CAPSULE | Freq: Every day | ORAL | Status: DC

## 2013-04-25 NOTE — Progress Notes (Signed)
Faxed Rapaflo rx for 90 days with 3 RF to [facility].

## 2013-05-01 ENCOUNTER — Telehealth: Payer: Self-pay

## 2013-05-01 NOTE — Telephone Encounter (Signed)
Per dr Conley Rolls. Called pt to notify them his Rapaflo is ready to pick up. This is not a written rx in the pick up drawer. It is an actual bottle of medicine attached to a white paper with his name on it.   Left non detailed message on pts vmail to call back . Please continue to try and notify pt.  bf

## 2013-05-02 NOTE — Telephone Encounter (Signed)
Pt returned call. Understood rx ready to pickup.   bf

## 2013-05-06 ENCOUNTER — Other Ambulatory Visit: Payer: Self-pay | Admitting: Urology

## 2013-05-06 ENCOUNTER — Ambulatory Visit (HOSPITAL_COMMUNITY)
Admission: RE | Admit: 2013-05-06 | Discharge: 2013-05-06 | Disposition: A | Discharge status: Home / Self Care | Payer: Self-pay | Source: Ambulatory Visit | Attending: Urology | Admitting: Urology

## 2013-05-06 ENCOUNTER — Encounter (HOSPITAL_COMMUNITY): Payer: Self-pay

## 2013-05-06 VITALS — BP 161/91 | HR 69 | Temp 97.4°F | Resp 16

## 2013-05-06 DIAGNOSIS — C61 Malignant neoplasm of prostate: Secondary | ICD-10-CM

## 2013-05-06 DIAGNOSIS — N320 Bladder-neck obstruction: Secondary | ICD-10-CM | POA: Insufficient documentation

## 2013-05-06 MED ORDER — LIDOCAINE HCL (PF) 2 % IJ SOLN
INTRAMUSCULAR | Status: AC
  Administered 2013-05-06: 10 mL
  Filled 2013-05-06: qty 10

## 2013-05-06 MED ORDER — GENTAMICIN SULFATE 40 MG/ML IJ SOLN
160.0000 mg | Freq: Once | INTRAMUSCULAR | Status: AC
  Filled 2013-05-06: qty 4

## 2013-05-06 MED ORDER — LIDOCAINE HCL (PF) 2 % IJ SOLN
10.0000 mL | Freq: Once | INTRAMUSCULAR | Status: AC
  Filled 2013-05-06: qty 10

## 2013-05-06 MED ORDER — GENTAMICIN SULFATE 40 MG/ML IJ SOLN
INTRAMUSCULAR | Status: AC
  Administered 2013-05-06: 160 mg via INTRAMUSCULAR
  Filled 2013-05-06: qty 4

## 2013-05-06 NOTE — Progress Notes (Signed)
Procedure complete no signs of distress.  

## 2013-05-16 ENCOUNTER — Ambulatory Visit
Admission: RE | Admit: 2013-05-16 | Discharge: 2013-05-16 | Disposition: A | Discharge status: Home / Self Care | Payer: Self-pay | Source: Ambulatory Visit | Attending: Radiation Oncology | Admitting: Radiation Oncology

## 2013-05-16 DIAGNOSIS — C61 Malignant neoplasm of prostate: Secondary | ICD-10-CM | POA: Insufficient documentation

## 2013-05-16 DIAGNOSIS — R634 Abnormal weight loss: Secondary | ICD-10-CM | POA: Insufficient documentation

## 2013-05-16 DIAGNOSIS — R3911 Hesitancy of micturition: Secondary | ICD-10-CM | POA: Insufficient documentation

## 2013-05-16 DIAGNOSIS — Z510 Encounter for antineoplastic radiation therapy: Secondary | ICD-10-CM | POA: Insufficient documentation

## 2013-05-16 DIAGNOSIS — R197 Diarrhea, unspecified: Secondary | ICD-10-CM | POA: Insufficient documentation

## 2013-05-16 MED ORDER — LORAZEPAM 1 MG PO TABS
1.0000 mg | ORAL_TABLET | Freq: Once | ORAL | Status: AC
  Administered 2013-05-16: 1 mg via ORAL
  Filled 2013-05-16: qty 1

## 2013-05-16 NOTE — Progress Notes (Signed)
Per Dr Kathrynn Running, gave pt Ativan 1.0 mg SL prior to ct sim.

## 2013-05-16 NOTE — Progress Notes (Signed)
  Radiation Oncology         [phone] ________________________________  Name: Nathan Smith MRN: [id number]  Date: 05/16/2013  DOB: 1950/09/29  SIMULATION AND TREATMENT PLANNING NOTE  DIAGNOSIS:  62 y.o. 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  Identity was confirmed.  All relevant records and images related to the planned course of therapy were reviewed.  The patient freely provided informed written consent to proceed with treatment after reviewing the details related to the planned course of therapy. The consent form was witnessed and verified by the simulation staff.  Then, the patient was set-up in a stable reproducible supine position for radiation therapy.  A vacuum lock pillow device was custom fabricated to position his legs in a reproducible immobilized position.  Then, I performed a urethrogram under sterile conditions to identify the prostatic apex.  CT images were obtained.  Surface markings were placed.  The CT images were loaded into the planning software.  Then the prostate target and avoidance structures including the rectum, bladder, bowel and hips were contoured.  Treatment planning then occurred.  The radiation prescription was entered and confirmed.  A total of 1 complex treatment devices was fabricated. I have requested : Intensity Modulated Radiotherapy (IMRT) is medically necessary for this case for the following reason:  Rectal sparing.Marland Kitchen  PLAN:  The patient will receive 75 Gy in 40 fractions.  ________________________________  Artist Pais Kathrynn Running, M.D.

## 2013-05-27 ENCOUNTER — Ambulatory Visit
Admission: RE | Admit: 2013-05-27 | Discharge: 2013-05-27 | Disposition: A | Discharge status: Home / Self Care | Payer: Self-pay | Source: Ambulatory Visit | Attending: Radiation Oncology | Admitting: Radiation Oncology

## 2013-05-28 ENCOUNTER — Ambulatory Visit
Admission: RE | Admit: 2013-05-28 | Discharge: 2013-05-28 | Disposition: A | Discharge status: Home / Self Care | Payer: Self-pay | Source: Ambulatory Visit | Attending: Radiation Oncology | Admitting: Radiation Oncology

## 2013-05-28 ENCOUNTER — Encounter: Payer: Self-pay | Admitting: Radiation Oncology

## 2013-05-28 VITALS — BP 147/88 | HR 96 | Resp 16 | Wt 158.8 lb

## 2013-05-28 DIAGNOSIS — C61 Malignant neoplasm of prostate: Secondary | ICD-10-CM

## 2013-05-28 NOTE — Progress Notes (Signed)
  Radiation Oncology         [phone] ________________________________  Name: Raymund Manrique MRN: [id number]  Date: 05/28/2013  DOB: 10-23-1950  Weekly Radiation Therapy Management  Current Dose: 3.6 Gy     Planned Dose:  75 Gy  Narrative . . . . . . . . The patient presents for routine under treatment assessment.                                   The patient is without complaint.                                 Set-up films were reviewed.                                 The chart was checked. Physical Findings. . .  weight is 158 lb 12.8 oz (72.031 kg). His blood pressure is 147/88 and his pulse is 96. His respiration is 16. . Weight essentially stable.  No significant changes. Impression . . . . . . . The patient is tolerating radiation. Plan . . . . . . . . . . . . Continue treatment as planned.  ________________________________  Artist Pais. Kathrynn Running, M.D.

## 2013-05-28 NOTE — Progress Notes (Addendum)
Reports diarrhea x3 months resolved 2 weeks ago. Denies nausea, vomiting, headache or dizziness. Denies weakness or fatigue. No complaints at this time. Weight stable. Reports an excellent appetite.   Oriented patient to staff and routine of the clinic. Provided patient with RADIATION THERAPY AND YOU handbook then, reviewed pertinent information. Educated patient reference potential side effects and management such as, fatigue, skin changes and bowel/bladder changes. All questions answered. Patient verbalized understanding of all reviewed.

## 2013-05-29 ENCOUNTER — Ambulatory Visit
Admission: RE | Admit: 2013-05-29 | Discharge: 2013-05-29 | Disposition: A | Discharge status: Home / Self Care | Payer: Self-pay | Source: Ambulatory Visit | Attending: Radiation Oncology | Admitting: Radiation Oncology

## 2013-05-30 ENCOUNTER — Ambulatory Visit
Admission: RE | Admit: 2013-05-30 | Discharge: 2013-05-30 | Disposition: A | Discharge status: Home / Self Care | Payer: Self-pay | Source: Ambulatory Visit | Attending: Radiation Oncology | Admitting: Radiation Oncology

## 2013-06-02 ENCOUNTER — Ambulatory Visit
Admission: RE | Admit: 2013-06-02 | Discharge: 2013-06-02 | Disposition: A | Discharge status: Home / Self Care | Payer: Self-pay | Source: Ambulatory Visit | Attending: Radiation Oncology | Admitting: Radiation Oncology

## 2013-06-03 ENCOUNTER — Ambulatory Visit
Admission: RE | Admit: 2013-06-03 | Discharge: 2013-06-03 | Disposition: A | Discharge status: Home / Self Care | Payer: Self-pay | Source: Ambulatory Visit | Attending: Radiation Oncology | Admitting: Radiation Oncology

## 2013-06-04 ENCOUNTER — Encounter: Payer: Self-pay | Admitting: Radiation Oncology

## 2013-06-04 ENCOUNTER — Ambulatory Visit
Admission: RE | Admit: 2013-06-04 | Discharge: 2013-06-04 | Disposition: A | Discharge status: Home / Self Care | Payer: Self-pay | Source: Ambulatory Visit | Attending: Radiation Oncology | Admitting: Radiation Oncology

## 2013-06-04 VITALS — BP 148/90 | HR 53 | Temp 98.3°F | Wt 156.8 lb

## 2013-06-04 DIAGNOSIS — C61 Malignant neoplasm of prostate: Secondary | ICD-10-CM

## 2013-06-04 NOTE — Progress Notes (Signed)
  Radiation Oncology         [phone] ________________________________  Name: Nathan Smith MRN: [id number]  Date: 06/04/2013  DOB: [birth year]  Weekly Radiation Therapy Management  Current Dose: 12.6 Gy     Planned Dose:  75 Gy  Narrative . . . . . . . . The patient presents for routine under treatment assessment.                                   The patient is without complaint.                                 Set-up films were reviewed.                                 The chart was checked. Physical Findings. . .  weight is 156 lb 12.8 oz (71.124 kg). His temperature is 98.3 F (36.8 C). His blood pressure is 148/90 and his pulse is 53. His oxygen saturation is 100%. . Weight essentially stable.  No significant changes. Impression . . . . . . . The patient is tolerating radiation. Plan . . . . . . . . . . . . Continue treatment as planned.  ________________________________  Artist Pais. Kathrynn Running, M.D.

## 2013-06-04 NOTE — Progress Notes (Signed)
Patient here for weekly assessment of radiation to pelvis for prostate cancer.Denies pain or burning on urination.Completed 7 of 25 treatments.Frequency and urgency of urination started Monday.Also frequency of  Soft stools.

## 2013-06-05 ENCOUNTER — Ambulatory Visit
Admission: RE | Admit: 2013-06-05 | Discharge: 2013-06-05 | Disposition: A | Discharge status: Home / Self Care | Payer: Self-pay | Source: Ambulatory Visit | Attending: Radiation Oncology | Admitting: Radiation Oncology

## 2013-06-06 ENCOUNTER — Ambulatory Visit
Admission: RE | Admit: 2013-06-06 | Discharge: 2013-06-06 | Disposition: A | Discharge status: Home / Self Care | Payer: Self-pay | Source: Ambulatory Visit | Attending: Radiation Oncology | Admitting: Radiation Oncology

## 2013-06-09 ENCOUNTER — Ambulatory Visit
Admission: RE | Admit: 2013-06-09 | Discharge: 2013-06-09 | Disposition: A | Discharge status: Home / Self Care | Payer: Self-pay | Source: Ambulatory Visit | Attending: Radiation Oncology | Admitting: Radiation Oncology

## 2013-06-10 ENCOUNTER — Ambulatory Visit
Admission: RE | Admit: 2013-06-10 | Discharge: 2013-06-10 | Disposition: A | Discharge status: Home / Self Care | Payer: Self-pay | Source: Ambulatory Visit | Attending: Radiation Oncology | Admitting: Radiation Oncology

## 2013-06-11 ENCOUNTER — Encounter: Payer: Self-pay | Admitting: Radiation Oncology

## 2013-06-11 ENCOUNTER — Ambulatory Visit
Admission: RE | Admit: 2013-06-11 | Discharge: 2013-06-11 | Disposition: A | Discharge status: Home / Self Care | Payer: Self-pay | Source: Ambulatory Visit | Attending: Radiation Oncology | Admitting: Radiation Oncology

## 2013-06-11 VITALS — BP 154/84 | HR 47 | Resp 16 | Wt 157.5 lb

## 2013-06-11 DIAGNOSIS — C61 Malignant neoplasm of prostate: Secondary | ICD-10-CM

## 2013-06-11 NOTE — Progress Notes (Signed)
Reports mild fatigue. Reports on average he gets up five time during the night to void but, Monday night he got up ten times. Encouraged patient to begin reducing fluid intake two hours prior to bedroom to aid in reducing the frequency he has to get up to void. Denies hematuria or dysuria. Reports hesitancy.  Reports diarrhea continues. Denies pain at this time.

## 2013-06-11 NOTE — Progress Notes (Signed)
  Radiation Oncology         [phone] ________________________________  Name: Nathan Smith MRN: [id number]  Date: 06/11/2013  DOB: Apr 25, 1951  Weekly Radiation Therapy Management  Current Dose: 21.6 Gy     Planned Dose:  45 Gy  Narrative . . . . . . . . The patient presents for routine under treatment assessment.                                   The patient is without complaint.                                 Set-up films were reviewed.                                 The chart was checked. Physical Findings. . .  weight is 157 lb 8 oz (71.442 kg). His blood pressure is 154/84 and his pulse is 47. His respiration is 16. . Weight essentially stable.  No significant changes. Impression . . . . . . . The patient is tolerating radiation. Plan . . . . . . . . . . . . Continue treatment as planned.  ________________________________  Artist Pais. Kathrynn Running, M.D.

## 2013-06-12 ENCOUNTER — Ambulatory Visit
Admission: RE | Admit: 2013-06-12 | Discharge: 2013-06-12 | Disposition: A | Discharge status: Home / Self Care | Payer: Self-pay | Source: Ambulatory Visit | Attending: Radiation Oncology | Admitting: Radiation Oncology

## 2013-06-13 ENCOUNTER — Ambulatory Visit
Admission: RE | Admit: 2013-06-13 | Discharge: 2013-06-13 | Disposition: A | Discharge status: Home / Self Care | Payer: Self-pay | Source: Ambulatory Visit | Attending: Radiation Oncology | Admitting: Radiation Oncology

## 2013-06-16 ENCOUNTER — Ambulatory Visit
Admission: RE | Admit: 2013-06-16 | Discharge: 2013-06-16 | Disposition: A | Discharge status: Home / Self Care | Payer: Self-pay | Source: Ambulatory Visit | Attending: Radiation Oncology | Admitting: Radiation Oncology

## 2013-06-17 ENCOUNTER — Ambulatory Visit
Admission: RE | Admit: 2013-06-17 | Discharge: 2013-06-17 | Disposition: A | Discharge status: Home / Self Care | Payer: Self-pay | Source: Ambulatory Visit | Attending: Radiation Oncology | Admitting: Radiation Oncology

## 2013-06-18 ENCOUNTER — Ambulatory Visit
Admission: RE | Admit: 2013-06-18 | Discharge: 2013-06-18 | Disposition: A | Discharge status: Home / Self Care | Payer: Self-pay | Source: Ambulatory Visit | Attending: Radiation Oncology | Admitting: Radiation Oncology

## 2013-06-19 ENCOUNTER — Ambulatory Visit
Admission: RE | Admit: 2013-06-19 | Discharge: 2013-06-19 | Disposition: A | Discharge status: Home / Self Care | Payer: Self-pay | Source: Ambulatory Visit | Attending: Radiation Oncology | Admitting: Radiation Oncology

## 2013-06-20 ENCOUNTER — Ambulatory Visit
Admission: RE | Admit: 2013-06-20 | Discharge: 2013-06-20 | Disposition: A | Discharge status: Home / Self Care | Payer: Self-pay | Source: Ambulatory Visit | Attending: Radiation Oncology | Admitting: Radiation Oncology

## 2013-06-20 ENCOUNTER — Encounter: Payer: Self-pay | Admitting: Radiation Oncology

## 2013-06-20 VITALS — BP 152/91 | HR 53 | Resp 16 | Wt 154.5 lb

## 2013-06-20 DIAGNOSIS — C61 Malignant neoplasm of prostate: Secondary | ICD-10-CM

## 2013-06-20 NOTE — Progress Notes (Signed)
Concerned about declining weight. Reports he has lost 3 lb in one week and 15 lb since beginning this journey. Reports eating well. Reports diarrhea continues. Reports taking imodium to control episodes of diarrhea. Denies skin changes within treatment area. Reports urinary hesitancy despite taking rapaflo. Denies dysuria or hematuria. Reports manageable fatigue.

## 2013-06-21 NOTE — Progress Notes (Signed)
   Department of Radiation Oncology  Phone:  [phone] Fax:        [phone]  Weekly Treatment Note    Name: Nathan Smith Date: 06/21/2013 MRN: [id number] DOB: Jul 17, 1951   Current dose: 34.2 Gy  Current fraction: 19   MEDICATIONS: Current Outpatient Prescriptions  Medication Sig Dispense Refill  . aspirin 325 MG tablet Take 325 mg by mouth daily.      . bicalutamide (CASODEX) 50 MG tablet Take 50 mg by mouth daily.      . finasteride (PROSCAR) 5 MG tablet Take 5 mg by mouth daily.      . L-ARGININE PO Take 500 mg by mouth daily.       Marland Kitchen Leuprolide Acetate, 6 Month, (LUPRON DEPOT) 45 MG injection Inject 45 mg into the muscle every 6 (six) months.      . metoprolol succinate (TOPROL XL) 25 MG 24 hr tablet Take 1/2 tab PO daily  30 tablet  3  . Misc Natural Products (GINSENG COMPLEX PO) Take by mouth.      . silodosin (RAPAFLO) 8 MG CAPS capsule Take 1 capsule (8 mg total) by mouth daily with breakfast.  90 capsule  3  . Zn-Pyg Afri-Nettle-Saw Palmet (SAW PALMETTO COMPLEX PO) Take by mouth.       No current facility-administered medications for this encounter.     ALLERGIES: Review of patient's allergies indicates no known allergies.   LABORATORY DATA:  No results found for this basename: WBC, HGB, HCT, MCV, PLT   No results found for this basename: NA, K, CL, CO2   No results found for this basename: ALT, AST, GGT, ALKPHOS, BILITOT     NARRATIVE: Nathan Smith was seen today for weekly treatment management. The chart was checked and the patient's films were reviewed. The patient complains of some decreased urinary drain. He does take wrap of low medication. He also is concerned about a decrease in weight which has continued.  PHYSICAL EXAMINATION: weight is 154 lb 8 oz (70.081 kg). His blood pressure is 152/91 and his pulse is 53. His respiration is 16.        ASSESSMENT: The patient is doing satisfactorily with treatment.  PLAN: We will continue with the  patient's radiation treatment as planned. We discussed his weight loss in some detail. I encourage the patient to begin drinking some nutritional supplements and we will continue to follow this. He is on wrap of flow and we will see how his urinary stream did show changes as he goes earlier through treatment.

## 2013-06-23 ENCOUNTER — Ambulatory Visit
Admission: RE | Admit: 2013-06-23 | Discharge: 2013-06-23 | Disposition: A | Discharge status: Home / Self Care | Payer: Self-pay | Source: Ambulatory Visit | Attending: Radiation Oncology | Admitting: Radiation Oncology

## 2013-06-24 ENCOUNTER — Ambulatory Visit: Payer: Self-pay

## 2013-06-25 ENCOUNTER — Ambulatory Visit
Admission: RE | Admit: 2013-06-25 | Discharge: 2013-06-25 | Disposition: A | Discharge status: Home / Self Care | Payer: Self-pay | Source: Ambulatory Visit | Attending: Radiation Oncology | Admitting: Radiation Oncology

## 2013-06-26 ENCOUNTER — Ambulatory Visit
Admission: RE | Admit: 2013-06-26 | Discharge: 2013-06-26 | Disposition: A | Discharge status: Home / Self Care | Payer: Self-pay | Source: Ambulatory Visit | Attending: Radiation Oncology | Admitting: Radiation Oncology

## 2013-06-27 ENCOUNTER — Ambulatory Visit
Admission: RE | Admit: 2013-06-27 | Discharge: 2013-06-27 | Disposition: A | Discharge status: Home / Self Care | Payer: Self-pay | Source: Ambulatory Visit | Attending: Radiation Oncology | Admitting: Radiation Oncology

## 2013-06-30 ENCOUNTER — Ambulatory Visit
Admission: RE | Admit: 2013-06-30 | Discharge: 2013-06-30 | Disposition: A | Discharge status: Home / Self Care | Payer: Self-pay | Source: Ambulatory Visit | Attending: Radiation Oncology | Admitting: Radiation Oncology

## 2013-06-30 ENCOUNTER — Encounter: Payer: Self-pay | Admitting: Radiation Oncology

## 2013-06-30 VITALS — BP 148/90 | HR 59 | Resp 16 | Wt 154.3 lb

## 2013-06-30 DIAGNOSIS — C61 Malignant neoplasm of prostate: Secondary | ICD-10-CM

## 2013-06-30 NOTE — Progress Notes (Addendum)
  Radiation Oncology         [phone] ________________________________  Name: Nathan Smith MRN: [id number]  Date: 06/30/2013  DOB: 12-Mar-1951  Weekly Radiation Therapy Management  Current Dose: 43.2 Gy     Planned Dose:  75 Gy  Narrative . . . . . . . . The patient presents for routine under treatment assessment.                                   The patient is without complaint.                                 Set-up films were reviewed.                                 The chart was checked. Physical Findings. . .  weight is 154 lb 4.8 oz (69.99 kg). His blood pressure is 148/90 and his pulse is 59. His respiration is 16. . Weight essentially stable.  No significant changes. Impression . . . . . . . The patient is tolerating radiation. Plan . . . . . . . . . . . . Continue treatment as planned.  ________________________________  Artist Pais. Kathrynn Running, M.D.

## 2013-06-30 NOTE — Addendum Note (Signed)
Encounter addended by: Oneita Hurt, MD on: 06/30/2013  3:32 PM<BR>     Documentation filed: Notes Section

## 2013-06-30 NOTE — Progress Notes (Signed)
Concerned about declining weight. Reports he has lost 3 lb in one week and 15 lb since beginning this journey. Patient scheduled for a consultation with Vernell Leep, RD tomorrow at 1430. Reports eating well with a good appetite. Reports diarrhea continues but, less frequent. Reports taking imodium to control episodes of diarrhea. Denies skin changes within treatment area. Reports urinary hesitancy despite taking rapaflo. Denies dysuria or hematuria. Reports manageable fatigue.

## 2013-07-01 ENCOUNTER — Ambulatory Visit: Payer: Self-pay | Admitting: Nutrition

## 2013-07-01 ENCOUNTER — Ambulatory Visit
Admission: RE | Admit: 2013-07-01 | Discharge: 2013-07-01 | Disposition: A | Discharge status: Home / Self Care | Payer: Self-pay | Source: Ambulatory Visit | Attending: Radiation Oncology | Admitting: Radiation Oncology

## 2013-07-01 NOTE — Progress Notes (Signed)
This 62 year old patient diagnosed with prostate cancer receiving radiation treatment is a patient of Dr. Mitzi Hansen.  Past medical history includes depression, hypertension, anxiety, and ascites.  Medications include ginseng, L. arginine, and saw palmetto.  Labs: No recent labs to review.  Height: 6 feet 4 inches. Weight: 154.5 pounds. Usual body weight: 170 pounds December 2013. BMI: 18.81.  Patient reports he had diarrhea for 3 months before he was diagnosed and treated.  He does admit to about an 18 pound weight loss.  Patient reports he has always been thin and had a difficult time gaining weight up until a few years ago.  Patient tries to follow a healthy, plant-based diet and is aware of protein and the importance in his diet.  He does not eat a lot of processed foods.  He has stopped taking some herbal supplements, but plans on reevaluating.  Patient has many questions about sugar and cancer, acid/alkaline diet and resources.  Nutrition diagnosis: Altered GI function related to prostate cancer and associated treatments as evidenced by patient reporting up to 8 stools daily and 9% weight loss over the past 11 months.  Intervention: Patient was educated to continue a healthy, plant-based diet with adequate protein.  I have educated him on a lower fiber diet until diarrhea has resolved.  I educated him in detail on ways to improve and incorporate more plant-based proteins.  I recommended patient consume smaller, more frequent meals.  I provided many fact sheets to patient for his review.  Teach back method used.  Questions were answered.  Monitoring, evaluation, goals: Patient will continue a healthy, plant-based diet to minimize further weight loss.  He will follow a low fiber diet until diarrhea resolved.  Next visit: Patient will contact me with questions or concerns.

## 2013-07-02 ENCOUNTER — Ambulatory Visit
Admission: RE | Admit: 2013-07-02 | Discharge: 2013-07-02 | Disposition: A | Discharge status: Home / Self Care | Payer: Self-pay | Source: Ambulatory Visit | Attending: Radiation Oncology | Admitting: Radiation Oncology

## 2013-07-07 ENCOUNTER — Ambulatory Visit
Admission: RE | Admit: 2013-07-07 | Discharge: 2013-07-07 | Disposition: A | Discharge status: Home / Self Care | Payer: Self-pay | Source: Ambulatory Visit | Attending: Radiation Oncology | Admitting: Radiation Oncology

## 2013-07-08 ENCOUNTER — Ambulatory Visit (INDEPENDENT_AMBULATORY_CARE_PROVIDER_SITE_OTHER): Payer: Self-pay | Admitting: Urology

## 2013-07-08 ENCOUNTER — Ambulatory Visit
Admission: RE | Admit: 2013-07-08 | Discharge: 2013-07-08 | Disposition: A | Discharge status: Home / Self Care | Payer: Self-pay | Source: Ambulatory Visit | Attending: Radiation Oncology | Admitting: Radiation Oncology

## 2013-07-08 DIAGNOSIS — C61 Malignant neoplasm of prostate: Secondary | ICD-10-CM

## 2013-07-08 DIAGNOSIS — N320 Bladder-neck obstruction: Secondary | ICD-10-CM

## 2013-07-09 ENCOUNTER — Ambulatory Visit: Payer: Self-pay

## 2013-07-10 ENCOUNTER — Ambulatory Visit
Admission: RE | Admit: 2013-07-10 | Discharge: 2013-07-10 | Disposition: A | Discharge status: Home / Self Care | Payer: Self-pay | Source: Ambulatory Visit | Attending: Radiation Oncology | Admitting: Radiation Oncology

## 2013-07-10 ENCOUNTER — Ambulatory Visit: Payer: Self-pay | Admitting: Radiation Oncology

## 2013-07-11 ENCOUNTER — Encounter: Payer: Self-pay | Admitting: Radiation Oncology

## 2013-07-11 ENCOUNTER — Ambulatory Visit
Admission: RE | Admit: 2013-07-11 | Discharge: 2013-07-11 | Disposition: A | Discharge status: Home / Self Care | Payer: Self-pay | Source: Ambulatory Visit | Attending: Radiation Oncology | Admitting: Radiation Oncology

## 2013-07-11 VITALS — BP 131/86 | HR 58 | Resp 16 | Wt 153.6 lb

## 2013-07-11 DIAGNOSIS — C61 Malignant neoplasm of prostate: Secondary | ICD-10-CM

## 2013-07-11 NOTE — Progress Notes (Signed)
Weight has stabilized. Confirms urinary hesitancy continues despite rapaflo. Patient not emptying well. Express that his urologist is very concerned about kidney failure. Denies dysuria or hematuria. Reports diarrhea is off and on. Denies skin changes within treatment field.

## 2013-07-13 ENCOUNTER — Encounter: Payer: Self-pay | Admitting: Radiation Oncology

## 2013-07-13 NOTE — Progress Notes (Signed)
  Radiation Oncology         [phone] ________________________________  Name: Nathan Smith MRN: [id number]  Date: 07/11/2013  DOB: [birth year]  Weekly Radiation Therapy Management  Current Dose: 55 Gy     Planned Dose:  75 Gy  Narrative . . . . . . . . The patient presents for routine under treatment assessment.  Confirms urinary hesitancy continues despite rapaflo. Patient not emptying well. Express that his urologist is very concerned about kidney failure. Denies dysuria or hematuria. Reports diarrhea is off and on. Denies skin changes within treatment field.                                    The patient is without complaint.                                 Set-up films were reviewed.                                 The chart was checked. Physical Findings. . .  weight is 153 lb 9.6 oz (69.673 kg). His blood pressure is 131/86 and his pulse is 58. His respiration is 16. . Weight has stabilized..  No significant changes. Impression . . . . . . . The patient is tolerating radiation. Plan . . . . . . . . . . . . Continue treatment as planned.  ________________________________  Artist Pais. Kathrynn Running, M.D.

## 2013-07-14 ENCOUNTER — Ambulatory Visit
Admission: RE | Admit: 2013-07-14 | Discharge: 2013-07-14 | Disposition: A | Discharge status: Home / Self Care | Payer: Self-pay | Source: Ambulatory Visit | Attending: Radiation Oncology | Admitting: Radiation Oncology

## 2013-07-15 ENCOUNTER — Other Ambulatory Visit: Payer: Self-pay | Admitting: Radiology

## 2013-07-15 ENCOUNTER — Ambulatory Visit
Admission: RE | Admit: 2013-07-15 | Discharge: 2013-07-15 | Disposition: A | Discharge status: Home / Self Care | Payer: Self-pay | Source: Ambulatory Visit | Attending: Radiation Oncology | Admitting: Radiation Oncology

## 2013-07-15 DIAGNOSIS — R350 Frequency of micturition: Secondary | ICD-10-CM

## 2013-07-15 NOTE — Telephone Encounter (Signed)
Please advise on rapaflo refill, I will need ot fax to patient assistance program.

## 2013-07-16 ENCOUNTER — Ambulatory Visit
Admission: RE | Admit: 2013-07-16 | Discharge: 2013-07-16 | Disposition: A | Discharge status: Home / Self Care | Payer: Self-pay | Source: Ambulatory Visit | Attending: Radiation Oncology | Admitting: Radiation Oncology

## 2013-07-16 MED ORDER — SILODOSIN 8 MG PO CAPS: 8.0000 mg | ORAL_CAPSULE | Freq: Every day | ORAL | Status: DC

## 2013-07-17 ENCOUNTER — Ambulatory Visit
Admission: RE | Admit: 2013-07-17 | Discharge: 2013-07-17 | Disposition: A | Discharge status: Home / Self Care | Payer: Self-pay | Source: Ambulatory Visit | Attending: Radiation Oncology | Admitting: Radiation Oncology

## 2013-07-18 ENCOUNTER — Ambulatory Visit
Admission: RE | Admit: 2013-07-18 | Discharge: 2013-07-18 | Disposition: A | Discharge status: Home / Self Care | Payer: Self-pay | Source: Ambulatory Visit | Attending: Radiation Oncology | Admitting: Radiation Oncology

## 2013-07-18 ENCOUNTER — Other Ambulatory Visit: Payer: Self-pay | Admitting: Urology

## 2013-07-18 ENCOUNTER — Encounter: Payer: Self-pay | Admitting: Radiation Oncology

## 2013-07-18 VITALS — BP 158/95 | HR 74 | Resp 16 | Wt 155.3 lb

## 2013-07-18 DIAGNOSIS — C61 Malignant neoplasm of prostate: Secondary | ICD-10-CM

## 2013-07-18 DIAGNOSIS — N1330 Unspecified hydronephrosis: Secondary | ICD-10-CM

## 2013-07-18 DIAGNOSIS — N320 Bladder-neck obstruction: Secondary | ICD-10-CM

## 2013-07-18 NOTE — Progress Notes (Signed)
Patient's major complaint is diarrhea. He explains that one hour prior to treatment he had an uncontrolled episode of bowel incontinence. Reports increase episodes of diarrhea. Reports one day this week he had 7-10 bouts of diarrhea in one hour. Patient reports he is very weak and fatigued since Wednesday. Reports taking generic form of imodium without relief.  Patient attempting to moving. Denies skin changes in treatment area. Patient has gained 2 lb since last week. Confirms urinary hesitancy continues despite rapaflo. Patient not emptying well. Express that his urologist is very concerned about kidney failure therefore an ultrasound is scheduled for next week. Denies dysuria or hematuria. Reports diarrhea is off and on. Denies skin changes within treatment field.

## 2013-07-18 NOTE — Progress Notes (Signed)
  Radiation Oncology         (336) [phone] ________________________________  Name: Nathan Smith MRN: [id number]  Date: 07/18/2013  DOB: 12-Apr-1951  Weekly Radiation Therapy Management  Current Dose: 65 Gy     Planned Dose:  75 Gy  Narrative . . . . . . . . The patient presents for routine under treatment assessment.  He has been experiencing diarrhea for a few days                                   The patient is without complaint.                                 Set-up films were reviewed.                                 The chart was checked. Physical Findings. . .  weight is 155 lb 4.8 oz (70.444 kg). His blood pressure is 158/95 and his pulse is 74. His respiration is 16. . Weight essentially stable.  No significant changes. Impression . . . . . . . The patient is tolerating radiation. Plan . . . . . . . . . . . . Continue treatment as planned.  Discussed Immodium AD usage.  ________________________________  Artist Pais Kathrynn Running, M.D.

## 2013-07-21 ENCOUNTER — Ambulatory Visit
Admission: RE | Admit: 2013-07-21 | Discharge: 2013-07-21 | Disposition: A | Discharge status: Home / Self Care | Payer: Self-pay | Source: Ambulatory Visit | Attending: Radiation Oncology | Admitting: Radiation Oncology

## 2013-07-22 ENCOUNTER — Ambulatory Visit
Admission: RE | Admit: 2013-07-22 | Discharge: 2013-07-22 | Disposition: A | Discharge status: Home / Self Care | Payer: Self-pay | Source: Ambulatory Visit | Attending: Radiation Oncology | Admitting: Radiation Oncology

## 2013-07-22 ENCOUNTER — Ambulatory Visit (INDEPENDENT_AMBULATORY_CARE_PROVIDER_SITE_OTHER): Payer: Self-pay | Admitting: Urology

## 2013-07-22 DIAGNOSIS — C61 Malignant neoplasm of prostate: Secondary | ICD-10-CM

## 2013-07-23 ENCOUNTER — Ambulatory Visit (HOSPITAL_COMMUNITY)
Admission: RE | Admit: 2013-07-23 | Discharge: 2013-07-23 | Disposition: A | Discharge status: Home / Self Care | Payer: Self-pay | Source: Ambulatory Visit | Attending: Urology | Admitting: Urology

## 2013-07-23 ENCOUNTER — Ambulatory Visit
Admission: RE | Admit: 2013-07-23 | Discharge: 2013-07-23 | Disposition: A | Discharge status: Home / Self Care | Payer: Self-pay | Source: Ambulatory Visit | Attending: Radiation Oncology | Admitting: Radiation Oncology

## 2013-07-23 ENCOUNTER — Encounter: Payer: Self-pay | Admitting: Radiation Oncology

## 2013-07-23 DIAGNOSIS — N320 Bladder-neck obstruction: Secondary | ICD-10-CM

## 2013-07-23 DIAGNOSIS — N1330 Unspecified hydronephrosis: Secondary | ICD-10-CM | POA: Insufficient documentation

## 2013-07-23 DIAGNOSIS — C61 Malignant neoplasm of prostate: Secondary | ICD-10-CM

## 2013-07-23 DIAGNOSIS — N3289 Other specified disorders of bladder: Secondary | ICD-10-CM | POA: Insufficient documentation

## 2013-07-23 NOTE — Progress Notes (Signed)
  Radiation Oncology         (336) [phone] ________________________________  Name: Nathan Smith MRN: [id number]  Date: 07/23/2013  DOB: 17-Oct-1950  Weekly Radiation Therapy Management  Current Dose: 71 Gy     Planned Dose:  75 Gy  Narrative . . . . . . . . The patient presents for routine under treatment assessment in the hallway.                                   The patient is without complaint.                                 Set-up films were reviewed.                                 The chart was checked. Physical Findings. . . . Weight essentially stable.  No significant changes. Impression . . . . . . . The patient is tolerating radiation. Plan . . . . . . . . . . . . Continue treatment as planned.  ________________________________  Artist Pais. Kathrynn Running, M.D.

## 2013-07-24 ENCOUNTER — Ambulatory Visit
Admission: RE | Admit: 2013-07-24 | Discharge: 2013-07-24 | Disposition: A | Discharge status: Home / Self Care | Payer: Self-pay | Source: Ambulatory Visit | Attending: Radiation Oncology | Admitting: Radiation Oncology

## 2013-07-25 ENCOUNTER — Ambulatory Visit: Payer: Self-pay

## 2013-07-27 ENCOUNTER — Encounter: Payer: Self-pay | Admitting: Radiation Oncology

## 2013-07-27 NOTE — Progress Notes (Signed)
  Radiation Oncology         [phone]) [phone] ________________________________  Name: Nathan Smith MRN: [id number]  Date: 07/28/2013  DOB: 1951-05-18  End of Treatment Note  Diagnosis:   62 y.o. 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  The patient's prostate, seminal vesicles, and pelvic lymph nodes were initially treated to 45 gray in 25 fractions of 1.8 gray  2.  the patient's prostate alone was boosted to 75 gray with 15 additional fractions of 2 gray  Beams/energy:    1.  The patient's prostate, seminal vesicles, and pelvic lymph nodes were initially treated using helical intensity modulated radiotherapy with 6 megavolt photons. He was position with a custom molded body fix immobilization device and underwent image guided radiotherapy with daily megavoltage CT images for careful setup corrections prior to each fraction. 2.  the patient's prostate alone was boosted using helical intensity modulated radiotherapy with 6 megavolt photons. He was position with a custom molded body fix immobilization device and underwent image guided radiotherapy with daily megavoltage CT images for careful setup corrections prior to each fraction.  Narrative: The patient tolerated radiation treatment relatively well.   His course was notable for bouts of diarrhea which she managed with dietary changes and Imodium. He did suffer with urinary hesitancy. The patient did lose some weight initially during radiation but has regained some of this weight after meeting with a dietitian.  Plan: The patient has completed radiation treatment. He will return to radiation oncology clinic for routine followup in one month. I advised him to call or return sooner if he  has any questions or concerns related to his recovery or treatment. ________________________________  Artist Pais. Kathrynn Running, M.D.

## 2013-07-28 ENCOUNTER — Ambulatory Visit
Admission: RE | Admit: 2013-07-28 | Discharge: 2013-07-28 | Disposition: A | Discharge status: Home / Self Care | Payer: Self-pay | Source: Ambulatory Visit | Attending: Radiation Oncology | Admitting: Radiation Oncology

## 2013-07-29 ENCOUNTER — Telehealth: Payer: Self-pay

## 2013-07-29 NOTE — Telephone Encounter (Signed)
Called and notified pt that Dr Conley Rolls got some more Rapaflo for him. I have put it in the bottom p/up drawer and given him holiday hours.

## 2013-09-04 ENCOUNTER — Ambulatory Visit
Admission: RE | Admit: 2013-09-04 | Discharge: 2013-09-04 | Disposition: A | Discharge status: Home / Self Care | Payer: Self-pay | Source: Ambulatory Visit | Attending: Radiation Oncology | Admitting: Radiation Oncology

## 2013-09-04 ENCOUNTER — Encounter: Payer: Self-pay | Admitting: Radiation Oncology

## 2013-09-04 VITALS — BP 148/84 | HR 64 | Resp 16 | Wt 164.6 lb

## 2013-09-04 DIAGNOSIS — C61 Malignant neoplasm of prostate: Secondary | ICD-10-CM

## 2013-09-04 NOTE — Progress Notes (Signed)
Radiation Oncology         [phone]) [phone] ________________________________  Name: Nathan Smith MRN: [id number]  Date: 09/04/2013  DOB: Sep 27, 1950  Follow-Up Visit Note  CC: No PCP Per Patient  Franchot Gallo, MD  Diagnosis:   63 y.o. 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  Scheduled to follow up with Dahlstedt in March. Dahlstedt is encouraging the patient to have nephrostomy tubes placed secondary to rising creatinine with bilateral severe hydronephrosis but, patient doesn't want this. Patient reports energy level has began to increase and that he is working out again. Denies dysuria or hematuria. Denies pain at this time. Reports diarrhea continues but, less frequent. Patient has gained 14 lb in one month. Reports nocturia x 5 down from 8-10                              ALLERGIES:  has No Known Allergies.  Meds: Current Outpatient Prescriptions  Medication Sig Dispense Refill  . aspirin 325 MG tablet Take 325 mg by mouth daily.      . bicalutamide (CASODEX) 50 MG tablet Take 50 mg by mouth daily.      . finasteride (PROSCAR) 5 MG tablet Take 5 mg by mouth daily.      . L-ARGININE PO Take 500 mg by mouth daily.       Marland Kitchen Leuprolide Acetate, 6 Month, (LUPRON DEPOT) 45 MG injection Inject 45 mg into the muscle every 6 (six) months.      . metoprolol succinate (TOPROL XL) 25 MG 24 hr tablet Take 1/2 tab PO daily  30 tablet  3  . Misc Natural Products (GINSENG COMPLEX PO) Take by mouth.      . silodosin (RAPAFLO) 8 MG CAPS capsule Take 1 capsule (8 mg total) by mouth daily with breakfast.  90 capsule  3  . Zn-Pyg Afri-Nettle-Saw Palmet (SAW PALMETTO COMPLEX PO) Take by mouth.       No current  facility-administered medications for this encounter.    Physical Findings: The patient is in no acute distress. Patient is alert and oriented.  weight is 164 lb 9.6 oz (74.662 kg). His blood pressure is 148/84 and his pulse is 64. His respiration is 16. .  No significant changes.  Impression:  The patient is recovering from the effects of radiation.  He is encouraged by his decreasing urinary frequency.  Plan:  Today, I spent approximately 20 minutes talking the patient about the purpose of nephrostomy tubes in the setting of urinary outflow obstruction causing hydronephrosis. He understands that chronic renal obstruction may ultimately to kidney failure. He also understands that this may lead to a requirement for dialysis. However, he has major reservations about having tubes placed. I encouraged him to maintain a followup dialog with urology for continuous monitoring and reassessment regardless of his decision.  He was agreeable to this.  He will continue to follow-up with urology regarding urinary symptoms and for ongoing PSA determinations.  I will look forward to following his response through their correspondence, and be happy to participate in care if clinically indicated.  I talked to the patient about what to expect in the  future, including his risk for erectile dysfunction and rectal bleeding.  I encouraged him to call or return to the office if he has any question about his previous radiation or possible radiation effects.  He was comfortable with this plan.  _____________________________________  Sheral Apley. Tammi Klippel, M.D.

## 2013-09-04 NOTE — Progress Notes (Signed)
Scheduled to follow up with Dahlstedt in March. Dahlstedt is encouraging the patient to have a urinary stent placed but, patient doesn't want this. Patient reports energy level has began to increase and that he is working out again. Denies dysuria or hematuria. Denies pain at this time. Reports diarrhea continues but, less frequent. Patient has gained 14 lb in one month. Reports nocturia x 5 down from 8-10.

## 2013-09-12 ENCOUNTER — Other Ambulatory Visit: Payer: Self-pay | Admitting: Family Medicine

## 2013-09-12 DIAGNOSIS — I10 Essential (primary) hypertension: Secondary | ICD-10-CM

## 2013-09-12 MED ORDER — METOPROLOL SUCCINATE ER 25 MG PO TB24: ORAL_TABLET | ORAL | Status: DC

## 2013-09-24 ENCOUNTER — Encounter: Payer: Self-pay | Admitting: *Deleted

## 2013-10-21 ENCOUNTER — Ambulatory Visit (INDEPENDENT_AMBULATORY_CARE_PROVIDER_SITE_OTHER): Payer: 59 | Admitting: Urology

## 2013-10-21 DIAGNOSIS — C61 Malignant neoplasm of prostate: Secondary | ICD-10-CM

## 2013-10-21 DIAGNOSIS — N1330 Unspecified hydronephrosis: Secondary | ICD-10-CM

## 2013-10-21 DIAGNOSIS — N320 Bladder-neck obstruction: Secondary | ICD-10-CM

## 2013-10-21 DIAGNOSIS — R972 Elevated prostate specific antigen [PSA]: Secondary | ICD-10-CM

## 2014-01-02 IMAGING — US US RENAL
1 series · 14 of 25 positions shown · non-contrast
Comparison: None.

CLINICAL DATA: Bladder neck obstruction.

EXAM:
RENAL/URINARY TRACT ULTRASOUND COMPLETE

[Series 1: us renal · 0.21mm/px · 14 of 50 slices shown]
[im 1/50]
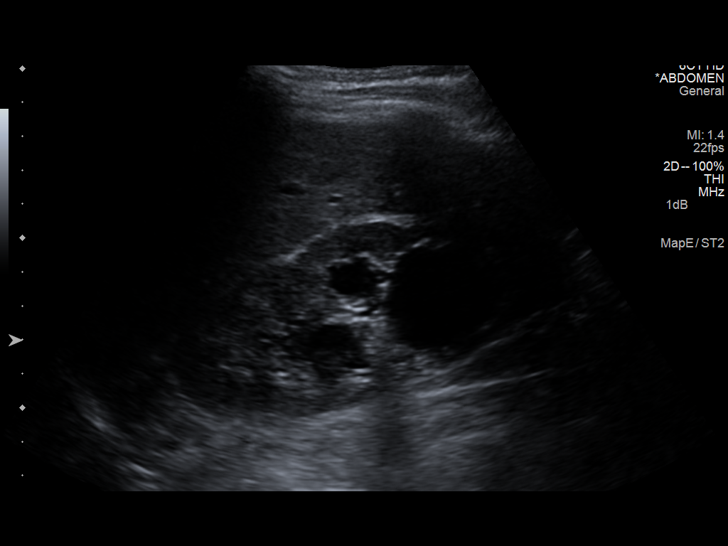
[im 5/50]
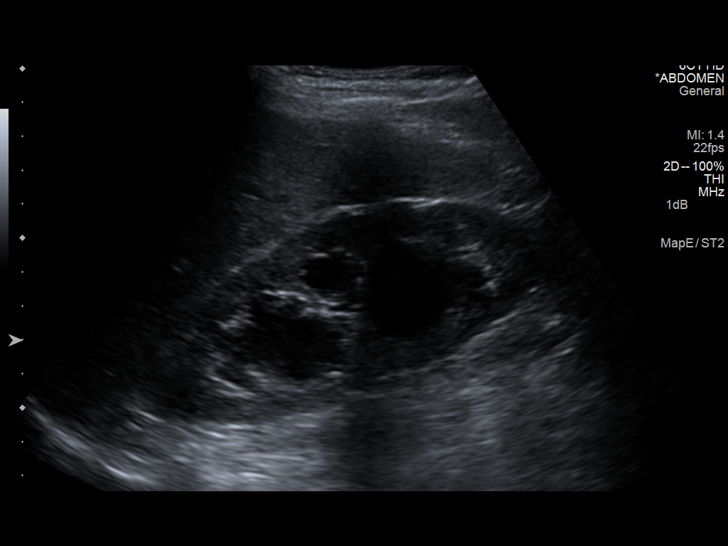
[im 9/50]
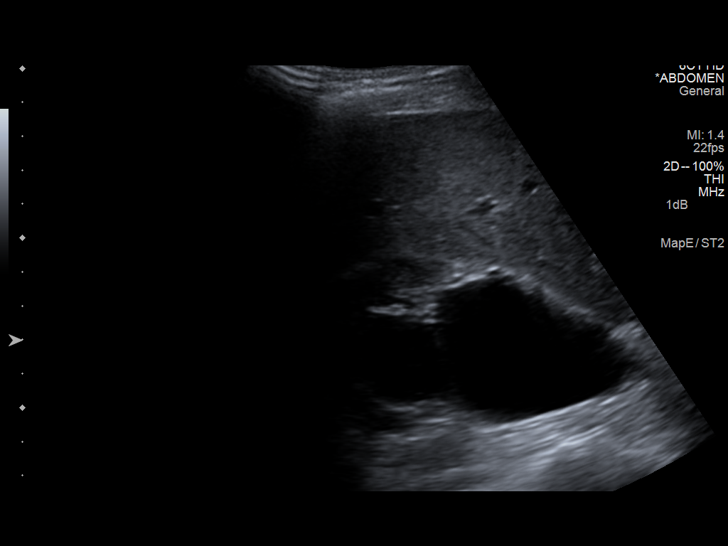
[im 13/50]
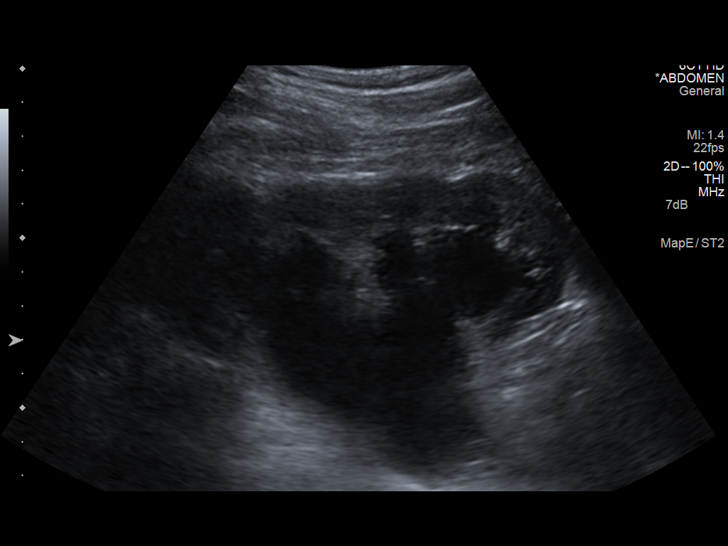
[im 17/50]
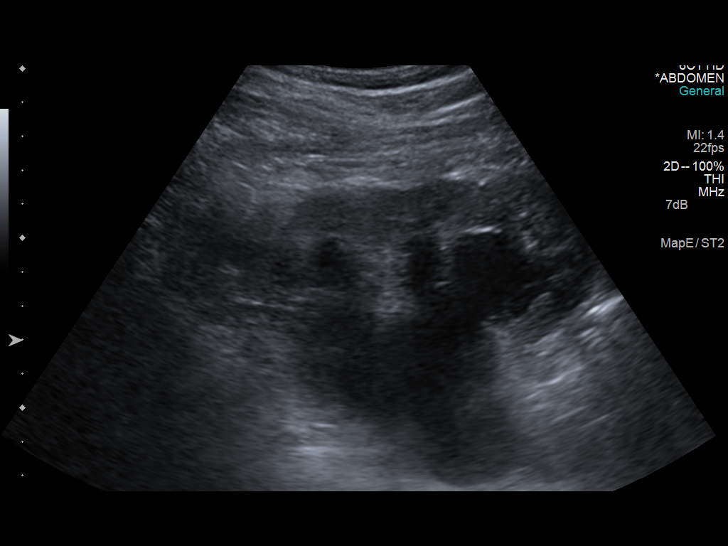
[im 19/50]
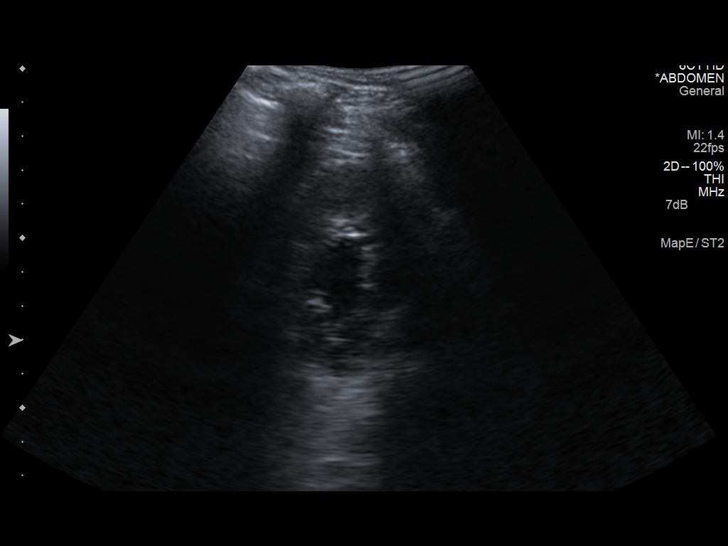
[im 23/50]
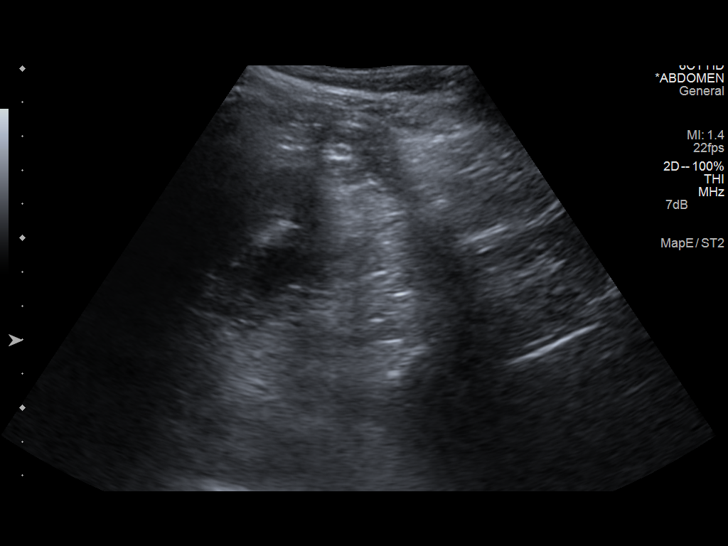
[im 27/50]
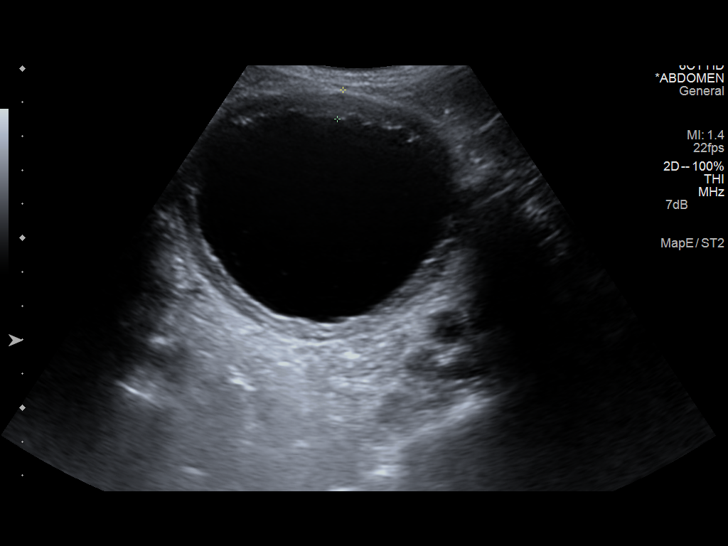
[im 31/50]
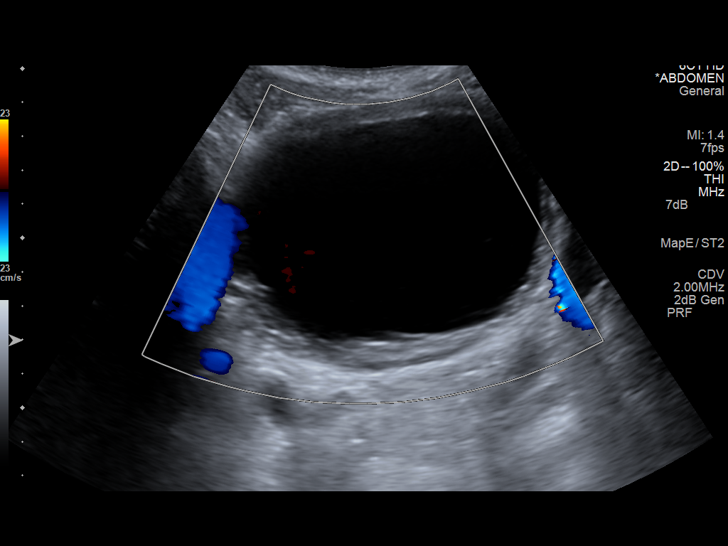
[im 33/50]
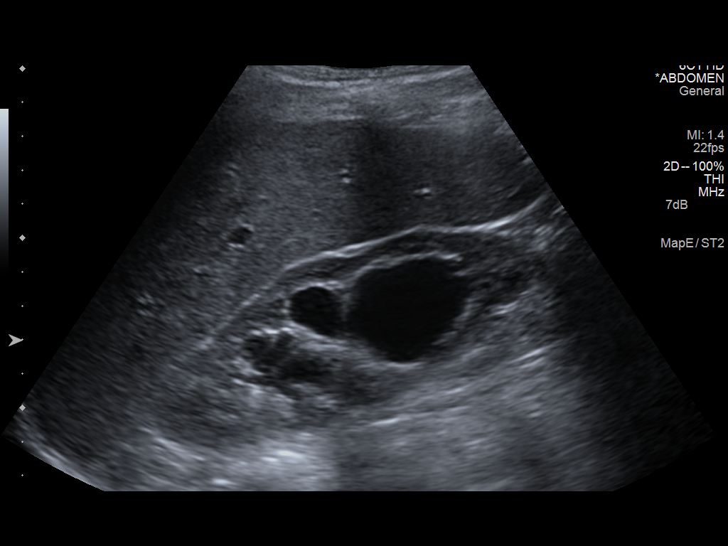
[im 37/50]
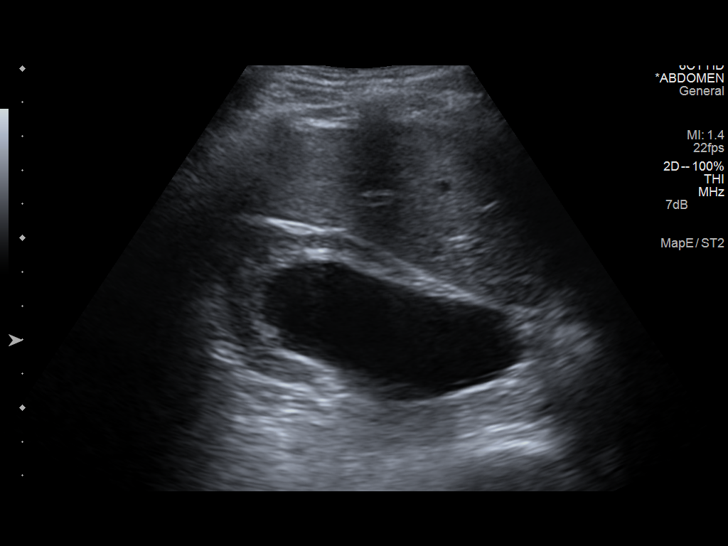
[im 41/50]
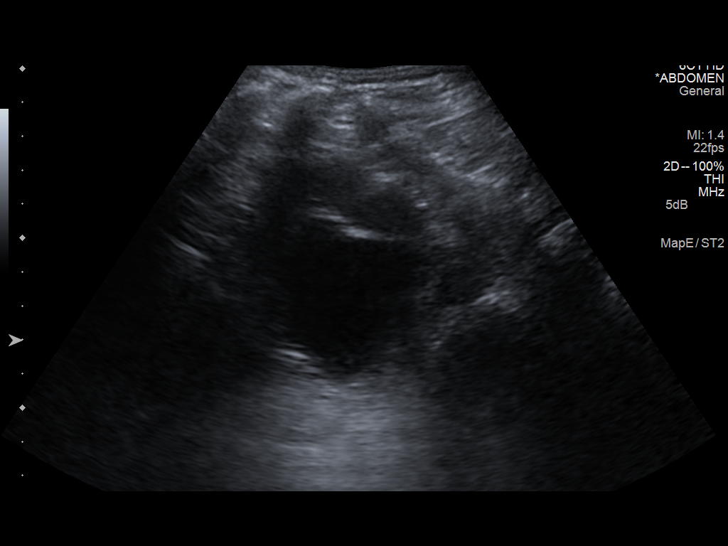
[im 45/50]
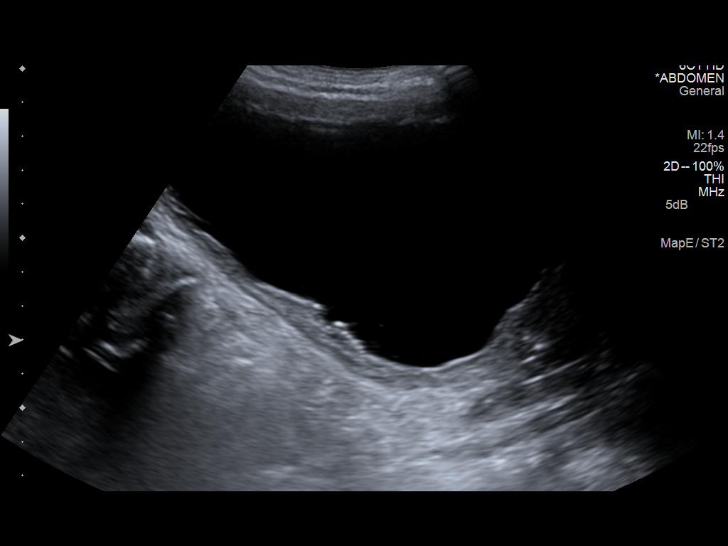
[im 50/50]
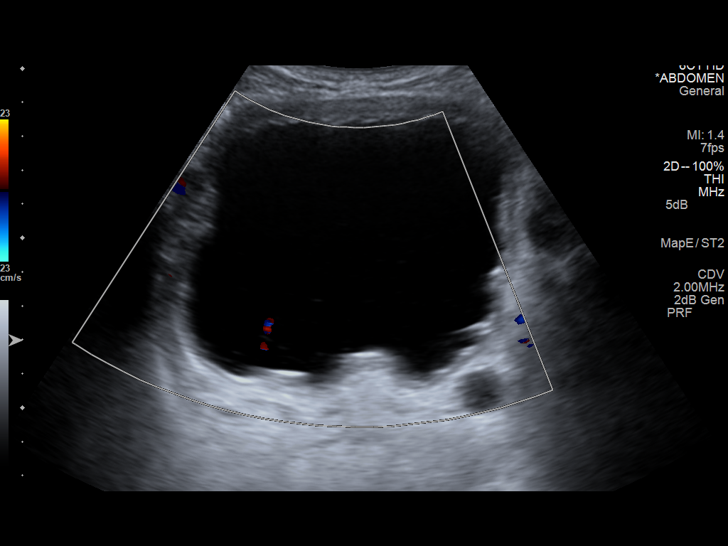

[14 of 25 positions shown; findings below may reference images not displayed]

FINDINGS: Right Kidney:

Length: 12.7 cm. Severe hydronephrosis with a mild diffuse cortical
thinning. No mass identified.

Left Kidney:

Length: 12.9 cm. Severe hydronephrosis with mild diffuse cortical
thinning. No mass identified.

Bladder:

Thick walled urinary bladder is identified. The bladder wall
measures up to 9 mm in thickness.
IMPRESSION: 1. Symmetric, severe bilateral hydronephrosis.
2. Thick walled urinary bladder consistent with chronic bladder
outlet obstruction.

## 2014-01-27 ENCOUNTER — Ambulatory Visit (INDEPENDENT_AMBULATORY_CARE_PROVIDER_SITE_OTHER): Payer: 59 | Admitting: Urology

## 2014-01-27 DIAGNOSIS — C61 Malignant neoplasm of prostate: Secondary | ICD-10-CM

## 2014-01-30 ENCOUNTER — Ambulatory Visit (INDEPENDENT_AMBULATORY_CARE_PROVIDER_SITE_OTHER): Payer: 59 | Admitting: Urology

## 2014-01-30 DIAGNOSIS — C61 Malignant neoplasm of prostate: Secondary | ICD-10-CM

## 2014-06-02 ENCOUNTER — Ambulatory Visit (INDEPENDENT_AMBULATORY_CARE_PROVIDER_SITE_OTHER): Payer: 59 | Admitting: Urology

## 2014-06-02 DIAGNOSIS — C61 Malignant neoplasm of prostate: Secondary | ICD-10-CM

## 2014-06-02 DIAGNOSIS — N320 Bladder-neck obstruction: Secondary | ICD-10-CM

## 2014-06-02 DIAGNOSIS — N3942 Incontinence without sensory awareness: Secondary | ICD-10-CM

## 2014-07-27 ENCOUNTER — Other Ambulatory Visit: Payer: Self-pay | Admitting: Family Medicine

## 2014-07-27 DIAGNOSIS — C61 Malignant neoplasm of prostate: Secondary | ICD-10-CM

## 2014-08-11 ENCOUNTER — Ambulatory Visit: Payer: 59 | Admitting: Urology

## 2014-09-29 ENCOUNTER — Ambulatory Visit (INDEPENDENT_AMBULATORY_CARE_PROVIDER_SITE_OTHER): Payer: 59 | Admitting: Urology

## 2014-09-29 DIAGNOSIS — C61 Malignant neoplasm of prostate: Secondary | ICD-10-CM

## 2015-01-26 ENCOUNTER — Ambulatory Visit (INDEPENDENT_AMBULATORY_CARE_PROVIDER_SITE_OTHER): Payer: 59 | Admitting: Urology

## 2015-01-26 DIAGNOSIS — C61 Malignant neoplasm of prostate: Secondary | ICD-10-CM

## 2015-01-26 DIAGNOSIS — N3944 Nocturnal enuresis: Secondary | ICD-10-CM

## 2015-03-30 ENCOUNTER — Ambulatory Visit: Payer: 59 | Admitting: Urology

## 2015-06-01 ENCOUNTER — Ambulatory Visit (INDEPENDENT_AMBULATORY_CARE_PROVIDER_SITE_OTHER): Payer: 59 | Admitting: Urology

## 2015-06-01 DIAGNOSIS — C61 Malignant neoplasm of prostate: Secondary | ICD-10-CM

## 2015-09-21 ENCOUNTER — Ambulatory Visit (INDEPENDENT_AMBULATORY_CARE_PROVIDER_SITE_OTHER): Payer: BLUE CROSS/BLUE SHIELD | Admitting: Urology

## 2015-09-21 ENCOUNTER — Other Ambulatory Visit: Payer: Self-pay | Admitting: Urology

## 2015-09-21 DIAGNOSIS — C61 Malignant neoplasm of prostate: Secondary | ICD-10-CM | POA: Diagnosis not present

## 2015-09-21 DIAGNOSIS — N1330 Unspecified hydronephrosis: Secondary | ICD-10-CM

## 2015-09-21 DIAGNOSIS — N5235 Erectile dysfunction following radiation therapy: Secondary | ICD-10-CM | POA: Diagnosis not present

## 2015-09-21 DIAGNOSIS — N320 Bladder-neck obstruction: Secondary | ICD-10-CM

## 2015-09-23 ENCOUNTER — Ambulatory Visit (HOSPITAL_COMMUNITY)
Admission: RE | Admit: 2015-09-23 | Discharge: 2015-09-23 | Disposition: A | Discharge status: Home / Self Care | Payer: BLUE CROSS/BLUE SHIELD | Source: Ambulatory Visit | Attending: Urology | Admitting: Urology

## 2015-09-23 DIAGNOSIS — N3289 Other specified disorders of bladder: Secondary | ICD-10-CM | POA: Diagnosis not present

## 2015-09-23 DIAGNOSIS — N320 Bladder-neck obstruction: Secondary | ICD-10-CM | POA: Insufficient documentation

## 2015-09-23 DIAGNOSIS — N1330 Unspecified hydronephrosis: Secondary | ICD-10-CM | POA: Insufficient documentation

## 2015-09-23 DIAGNOSIS — Z8546 Personal history of malignant neoplasm of prostate: Secondary | ICD-10-CM | POA: Insufficient documentation

## 2016-03-21 ENCOUNTER — Ambulatory Visit (INDEPENDENT_AMBULATORY_CARE_PROVIDER_SITE_OTHER): Payer: BLUE CROSS/BLUE SHIELD | Admitting: Urology

## 2016-03-21 DIAGNOSIS — N5201 Erectile dysfunction due to arterial insufficiency: Secondary | ICD-10-CM | POA: Diagnosis not present

## 2016-03-21 DIAGNOSIS — R351 Nocturia: Secondary | ICD-10-CM | POA: Diagnosis not present

## 2016-03-21 DIAGNOSIS — N401 Enlarged prostate with lower urinary tract symptoms: Secondary | ICD-10-CM

## 2016-03-21 DIAGNOSIS — C61 Malignant neoplasm of prostate: Secondary | ICD-10-CM | POA: Diagnosis not present

## 2016-09-26 ENCOUNTER — Ambulatory Visit (INDEPENDENT_AMBULATORY_CARE_PROVIDER_SITE_OTHER): Payer: BLUE CROSS/BLUE SHIELD | Admitting: Urology

## 2016-09-26 DIAGNOSIS — C61 Malignant neoplasm of prostate: Secondary | ICD-10-CM

## 2017-04-03 ENCOUNTER — Ambulatory Visit (INDEPENDENT_AMBULATORY_CARE_PROVIDER_SITE_OTHER): Payer: BLUE CROSS/BLUE SHIELD | Admitting: Urology

## 2017-04-03 DIAGNOSIS — C61 Malignant neoplasm of prostate: Secondary | ICD-10-CM

## 2017-05-02 ENCOUNTER — Other Ambulatory Visit: Payer: Self-pay | Admitting: Urology

## 2017-07-17 ENCOUNTER — Ambulatory Visit: Payer: BLUE CROSS/BLUE SHIELD | Admitting: Urology

## 2017-07-17 DIAGNOSIS — C61 Malignant neoplasm of prostate: Secondary | ICD-10-CM | POA: Diagnosis not present

## 2017-07-17 DIAGNOSIS — R9721 Rising PSA following treatment for malignant neoplasm of prostate: Secondary | ICD-10-CM | POA: Diagnosis not present

## 2017-09-14 ENCOUNTER — Other Ambulatory Visit (HOSPITAL_COMMUNITY): Payer: Self-pay | Admitting: Adult Health Nurse Practitioner

## 2017-09-14 DIAGNOSIS — K4090 Unilateral inguinal hernia, without obstruction or gangrene, not specified as recurrent: Secondary | ICD-10-CM

## 2017-09-18 ENCOUNTER — Ambulatory Visit (HOSPITAL_COMMUNITY)
Admission: RE | Admit: 2017-09-18 | Discharge: 2017-09-18 | Disposition: A | Discharge status: Home / Self Care | Payer: BLUE CROSS/BLUE SHIELD | Source: Ambulatory Visit | Attending: Adult Health Nurse Practitioner | Admitting: Adult Health Nurse Practitioner

## 2017-09-18 DIAGNOSIS — R1030 Lower abdominal pain, unspecified: Secondary | ICD-10-CM | POA: Insufficient documentation

## 2017-09-18 DIAGNOSIS — K4090 Unilateral inguinal hernia, without obstruction or gangrene, not specified as recurrent: Secondary | ICD-10-CM

## 2017-10-04 ENCOUNTER — Other Ambulatory Visit (HOSPITAL_COMMUNITY): Payer: Self-pay | Admitting: Internal Medicine

## 2017-10-10 ENCOUNTER — Other Ambulatory Visit (HOSPITAL_COMMUNITY): Payer: Self-pay | Admitting: Internal Medicine

## 2017-10-10 DIAGNOSIS — Z8546 Personal history of malignant neoplasm of prostate: Secondary | ICD-10-CM

## 2017-10-10 DIAGNOSIS — R102 Pelvic and perineal pain: Secondary | ICD-10-CM

## 2017-10-19 ENCOUNTER — Encounter (HOSPITAL_COMMUNITY): Payer: Self-pay

## 2017-10-19 ENCOUNTER — Ambulatory Visit (HOSPITAL_COMMUNITY)
Admission: RE | Admit: 2017-10-19 | Discharge: 2017-10-19 | Disposition: A | Discharge status: Home / Self Care | Payer: BLUE CROSS/BLUE SHIELD | Source: Ambulatory Visit | Attending: Internal Medicine | Admitting: Internal Medicine

## 2017-10-19 DIAGNOSIS — R102 Pelvic and perineal pain: Secondary | ICD-10-CM

## 2017-10-19 DIAGNOSIS — Z8546 Personal history of malignant neoplasm of prostate: Secondary | ICD-10-CM

## 2017-10-19 DIAGNOSIS — N400 Enlarged prostate without lower urinary tract symptoms: Secondary | ICD-10-CM | POA: Diagnosis not present

## 2017-10-19 DIAGNOSIS — Z9889 Other specified postprocedural states: Secondary | ICD-10-CM | POA: Insufficient documentation

## 2017-10-19 LAB — POCT I-STAT CREATININE: CREATININE: 1.2 mg/dL (ref 0.61–1.24)

## 2017-10-19 MED ORDER — IOPAMIDOL (ISOVUE-300) INJECTION 61%
100.0000 mL | Freq: Once | INTRAVENOUS | Status: AC | PRN
  Administered 2017-10-19: 100 mL via INTRAVENOUS

## 2017-11-29 ENCOUNTER — Other Ambulatory Visit: Payer: Self-pay | Admitting: Urology

## 2017-12-04 ENCOUNTER — Other Ambulatory Visit: Payer: Self-pay | Admitting: Urology

## 2017-12-04 DIAGNOSIS — R972 Elevated prostate specific antigen [PSA]: Secondary | ICD-10-CM

## 2017-12-13 NOTE — Patient Instructions (Signed)
Nathan Smith  12/13/2017     @PREFPERIOPPHARMACY @   Your procedure is scheduled on  12/20/2017 .  Report to [facility] at  [place].M.  Call this number if you have problems the morning of surgery:  [phone]   Remember:  Do not eat food or drink liquids after midnight.  Take these medicines the morning of surgery with A SIP OF WATER  Losartan, flomax.   Do not wear jewelry, make-up or nail polish.  Do not wear lotions, powders, or perfumes, or deodorant.  Do not shave 48 hours prior to surgery.  Men may shave face and neck.  Do not bring valuables to the hospital.  [facility] is not responsible for any belongings or valuables.  Contacts, dentures or bridgework may not be worn into surgery.  Leave your suitcase in the car.  After surgery it may be brought to your room.  For patients admitted to the hospital, discharge time will be determined by your treatment team.  Patients discharged the day of surgery will not be allowed to drive home.   Name and phone number of your driver:   family Special instructions:  None  Please read over the following fact sheets that you were given. Anesthesia Post-op Instructions and Care and Recovery After Surgery       Transrectal Ultrasound-Guided Prostate Gold Seed Placement, Care After This sheet gives you information about how to care for yourself after your procedure. Your health care provider may also give you more specific instructions. If you have problems or questions, contact your health care provider. What can I expect after the procedure? After the procedure, it is common to have:  Light bleeding from the rectum.  Bruising or tenderness in the area behind the scrotum (perineum).  Small amounts of blood in your urine. This should only last for a couple of days.  Light brown or red semen. This may last for a couple of weeks.  Follow these instructions at home: Medicines  Take over-the-counter and  prescription medicines only as told by your health care provider.  If you were prescribed an antibiotic medicine, take it as told by your health care provider. Do not stop taking the antibiotic even if you start to feel better. Activity  Do not drive for 24 hours if you were given a medicine to help you relax (sedative) during your procedure.  Do not drive or use heavy machinery while taking prescription pain medicine.  Return to your normal activities as told by your health care provider. Ask your health care provider what activities are safe for you.  Ask your health care provider when it is safe for you to resume sexual activity. General instructions  Do not take baths, swim, or use a hot tub until your health care provider approves.  Drink enough water to keep your urine pale yellow.  Keep all follow-up visits as told by your health care provider. This is important. Managing pain, stiffness, and swelling  If directed, put ice on the affected area: ? Put ice in a plastic bag. ? Place a towel between your skin and the bag. ? Leave the ice on for 20 minutes, 2-3 times a day.  Try not to sit directly on the area behind the scrotum. A soft cushion can help with discomfort. Contact a health care provider if:  You have a fever or chills.  You have more blood in your urine  instead of less.  You have blood in your urine for more than 2-3 days after the procedure.  You have trouble urinating or having a bowel movement.  You have pain or burning when urinating.  You have nausea or you vomit. Get help right away if:  You have severe pain that does not get better with medicine.  Your urine is bright red.  You cannot urinate.  You have rectal bleeding that gets worse.  You have shortness of breath. Summary  After the procedure, you may have blood in your urine and light bleeding from the rectum.  Return to your normal activities as told by your health care provider. Ask  your health care provider what activities are safe for you.  Take over-the-counter and prescription medicines only as told by your health care provider.  Contact your health care provider right away if you have increased bleeding during urination or increased rectal bleeding. This information is not intended to replace advice given to you by your health care provider. Make sure you discuss any questions you have with your health care provider. Document Released: 11/07/2016 Document Revised: 11/07/2016 Document Reviewed: 11/07/2016 Elsevier Interactive Patient Education  2018 [place].  Transrectal Ultrasound-Guided Biopsy A transrectal ultrasound-guided biopsy is a procedure to take samples of tissue from your prostate. Ultrasound images are used to guide the procedure. It is usually done to check the prostate gland for cancer. What happens before the procedure?  Do not eat or drink after midnight on the night before your procedure.  Take medicines as your doctor tells you.  Your doctor may have you stop taking some medicines 5-7 days before the procedure.  You will be given an enema before your procedure. During an enema, a liquid is put into your butt (rectum) to clear out waste.  You may have lab tests the day of your procedure.  Make plans to have someone drive you home. What happens during the procedure?  You will be given medicine to help you relax before the procedure. An IV tube will be put into one of your veins. It will be used to give fluids and medicine.  You will be given medicine to reduce the risk of infection (antibiotic).  You will be placed on your side.  A probe with gel will be put in your butt. This is used to take pictures of your prostate and the area around it.  A medicine to numb the area is put into your prostate.  A biopsy needle is then inserted and guided to your prostate.  Samples of prostate tissue are taken. The needle is removed.  The  samples are sent to a lab to be checked. Results are usually back in 2-3 days. What happens after the procedure?  You will be taken to a room where you will be watched until you are doing okay.  You may have some pain in the area around your butt. You will be given medicines for this.  You may be able to go home the same day. Sometimes, an overnight stay in the hospital is needed. This information is not intended to replace advice given to you by your health care provider. Make sure you discuss any questions you have with your health care provider. Document Released: 07/12/2009 Document Revised: 12/30/2015 Document Reviewed: 03/12/2013 Elsevier Interactive Patient Education  2018 [place] Anesthesia is a term that refers to techniques, procedures, and medicines that help a person stay safe and comfortable  during a medical procedure. Monitored anesthesia care, or sedation, is one type of anesthesia. Your anesthesia specialist may recommend sedation if you will be having a procedure that does not require you to be unconscious, such as:  Cataract surgery.  A dental procedure.  A biopsy.  A colonoscopy.  During the procedure, you may receive a medicine to help you relax (sedative). There are three levels of sedation:  Mild sedation. At this level, you may feel awake and relaxed. You will be able to follow directions.  Moderate sedation. At this level, you will be sleepy. You may not remember the procedure.  Deep sedation. At this level, you will be asleep. You will not remember the procedure.  The more medicine you are given, the deeper your level of sedation will be. Depending on how you respond to the procedure, the anesthesia specialist may change your level of sedation or the type of anesthesia to fit your needs. An anesthesia specialist will monitor you closely during the procedure. Let your health care provider know about:  Any allergies you  have.  All medicines you are taking, including vitamins, herbs, eye drops, creams, and over-the-counter medicines.  Any use of steroids (by mouth or as a cream).  Any problems you or family members have had with sedatives and anesthetic medicines.  Any blood disorders you have.  Any surgeries you have had.  Any medical conditions you have, such as sleep apnea.  Whether you are pregnant or may be pregnant.  Any use of cigarettes, alcohol, or street drugs. What are the risks? Generally, this is a safe procedure. However, problems may occur, including:  Getting too much medicine (oversedation).  Nausea.  Allergic reaction to medicines.  Trouble breathing. If this happens, a breathing tube may be used to help with breathing. It will be removed when you are awake and breathing on your own.  Heart trouble.  Lung trouble.  Before the procedure Staying hydrated Follow instructions from your health care provider about hydration, which may include:  Up to 2 hours before the procedure - you may continue to drink clear liquids, such as water, clear fruit juice, black coffee, and plain tea.  Eating and drinking restrictions Follow instructions from your health care provider about eating and drinking, which may include:  8 hours before the procedure - stop eating heavy meals or foods such as meat, fried foods, or fatty foods.  6 hours before the procedure - stop eating light meals or foods, such as toast or cereal.  6 hours before the procedure - stop drinking milk or drinks that contain milk.  2 hours before the procedure - stop drinking clear liquids.  Medicines Ask your health care provider about:  Changing or stopping your regular medicines. This is especially important if you are taking diabetes medicines or blood thinners.  Taking medicines such as aspirin and ibuprofen. These medicines can thin your blood. Do not take these medicines before your procedure if your health  care provider instructs you not to.  Tests and exams  You will have a physical exam.  You may have blood tests done to show: ? How well your kidneys and liver are working. ? How well your blood can clot.  General instructions  Plan to have someone take you home from the hospital or clinic.  If you will be going home right after the procedure, plan to have someone with you for 24 hours.  What happens during the procedure?  Your blood pressure, heart  rate, breathing, level of pain and overall condition will be monitored.  An IV tube will be inserted into one of your veins.  Your anesthesia specialist will give you medicines as needed to keep you comfortable during the procedure. This may mean changing the level of sedation.  The procedure will be performed. After the procedure  Your blood pressure, heart rate, breathing rate, and blood oxygen level will be monitored until the medicines you were given have worn off.  Do not drive for 24 hours if you received a sedative.  You may: ? Feel sleepy, clumsy, or nauseous. ? Feel forgetful about what happened after the procedure. ? Have a sore throat if you had a breathing tube during the procedure. ? Vomit. This information is not intended to replace advice given to you by your health care provider. Make sure you discuss any questions you have with your health care provider. Document Released: 04/19/2005 Document Revised: 12/31/2015 Document Reviewed: 11/14/2015 Elsevier Interactive Patient Education  2018 [place], Care After These instructions provide you with information about caring for yourself after your procedure. Your health care provider may also give you more specific instructions. Your treatment has been planned according to current medical practices, but problems sometimes occur. Call your health care provider if you have any problems or questions after your procedure. What can I expect after  the procedure? After your procedure, it is common to:  Feel sleepy for several hours.  Feel clumsy and have poor balance for several hours.  Feel forgetful about what happened after the procedure.  Have poor judgment for several hours.  Feel nauseous or vomit.  Have a sore throat if you had a breathing tube during the procedure.  Follow these instructions at home: For at least 24 hours after the procedure:   Do not: ? Participate in activities in which you could fall or become injured. ? Drive. ? Use heavy machinery. ? Drink alcohol. ? Take sleeping pills or medicines that cause drowsiness. ? Make important decisions or sign legal documents. ? Take care of children on your own.  Rest. Eating and drinking  Follow the diet that is recommended by your health care provider.  If you vomit, drink water, juice, or soup when you can drink without vomiting.  Make sure you have little or no nausea before eating solid foods. General instructions  Have a responsible adult stay with you until you are awake and alert.  Take over-the-counter and prescription medicines only as told by your health care provider.  If you smoke, do not smoke without supervision.  Keep all follow-up visits as told by your health care provider. This is important. Contact a health care provider if:  You keep feeling nauseous or you keep vomiting.  You feel light-headed.  You develop a rash.  You have a fever. Get help right away if:  You have trouble breathing. This information is not intended to replace advice given to you by your health care provider. Make sure you discuss any questions you have with your health care provider. Document Released: 11/14/2015 Document Revised: 03/15/2016 Document Reviewed: 11/14/2015 Elsevier Interactive Patient Education  [employer].

## 2017-12-18 ENCOUNTER — Other Ambulatory Visit: Payer: Self-pay

## 2017-12-18 ENCOUNTER — Other Ambulatory Visit: Payer: Self-pay | Admitting: Urology

## 2017-12-18 ENCOUNTER — Encounter (HOSPITAL_COMMUNITY): Payer: Self-pay

## 2017-12-18 ENCOUNTER — Encounter (HOSPITAL_COMMUNITY)
Admission: RE | Admit: 2017-12-18 | Discharge: 2017-12-18 | Disposition: A | Discharge status: Home / Self Care | Payer: BLUE CROSS/BLUE SHIELD | Source: Ambulatory Visit | Attending: Urology | Admitting: Urology

## 2017-12-18 DIAGNOSIS — Z01810 Encounter for preprocedural cardiovascular examination: Secondary | ICD-10-CM | POA: Diagnosis not present

## 2017-12-18 DIAGNOSIS — R972 Elevated prostate specific antigen [PSA]: Secondary | ICD-10-CM

## 2017-12-18 DIAGNOSIS — Z01812 Encounter for preprocedural laboratory examination: Secondary | ICD-10-CM | POA: Diagnosis not present

## 2017-12-18 LAB — CBC
HEMATOCRIT: 48 % (ref 39.0–52.0)
Hemoglobin: 15.9 g/dL (ref 13.0–17.0)
MCH: 33.8 pg (ref 26.0–34.0)
MCHC: 33.1 g/dL (ref 30.0–36.0)
MCV: 101.9 fL — ABNORMAL HIGH (ref 78.0–100.0)
Platelets: 257 10*3/uL (ref 150–400)
RBC: 4.71 MIL/uL (ref 4.22–5.81)
RDW: 13.7 % (ref 11.5–15.5)
WBC: 4.9 10*3/uL (ref 4.0–10.5)

## 2017-12-18 LAB — BASIC METABOLIC PANEL
Anion gap: 10 (ref 5–15)
BUN: 13 mg/dL (ref 6–20)
CALCIUM: 9.4 mg/dL (ref 8.9–10.3)
CO2: 30 mmol/L (ref 22–32)
Chloride: 99 mmol/L — ABNORMAL LOW (ref 101–111)
Creatinine, Ser: 1.11 mg/dL (ref 0.61–1.24)
GFR calc non Af Amer: >60 mL/min (ref >60)
Glucose, Bld: 128 mg/dL — ABNORMAL HIGH (ref 65–99)
Potassium: 4 mmol/L (ref 3.5–5.1)
SODIUM: 139 mmol/L (ref 135–145)

## 2017-12-20 ENCOUNTER — Ambulatory Visit (HOSPITAL_COMMUNITY): Payer: BLUE CROSS/BLUE SHIELD | Admitting: Anesthesiology

## 2017-12-20 ENCOUNTER — Ambulatory Visit (HOSPITAL_COMMUNITY): Payer: BLUE CROSS/BLUE SHIELD

## 2017-12-20 ENCOUNTER — Encounter (HOSPITAL_COMMUNITY): Payer: Self-pay

## 2017-12-20 ENCOUNTER — Ambulatory Visit (HOSPITAL_COMMUNITY)
Admission: RE | Admit: 2017-12-20 | Discharge: 2017-12-20 | Disposition: A | Discharge status: Home / Self Care | Payer: BLUE CROSS/BLUE SHIELD | Source: Ambulatory Visit | Attending: Urology | Admitting: Urology

## 2017-12-20 ENCOUNTER — Encounter (HOSPITAL_COMMUNITY)
Admission: RE | Disposition: A | Discharge status: Home / Self Care | Payer: Self-pay | Source: Ambulatory Visit | Attending: Urology

## 2017-12-20 ENCOUNTER — Ambulatory Visit (HOSPITAL_COMMUNITY): Admission: RE | Admit: 2017-12-20 | Payer: BLUE CROSS/BLUE SHIELD | Source: Ambulatory Visit

## 2017-12-20 DIAGNOSIS — I10 Essential (primary) hypertension: Secondary | ICD-10-CM | POA: Insufficient documentation

## 2017-12-20 DIAGNOSIS — C61 Malignant neoplasm of prostate: Secondary | ICD-10-CM | POA: Insufficient documentation

## 2017-12-20 DIAGNOSIS — Z08 Encounter for follow-up examination after completed treatment for malignant neoplasm: Secondary | ICD-10-CM | POA: Insufficient documentation

## 2017-12-20 DIAGNOSIS — R972 Elevated prostate specific antigen [PSA]: Secondary | ICD-10-CM | POA: Diagnosis not present

## 2017-12-20 DIAGNOSIS — Z8546 Personal history of malignant neoplasm of prostate: Secondary | ICD-10-CM

## 2017-12-20 HISTORY — PX: PROSTATE BIOPSY: SHX241

## 2017-12-20 SURGERY — BIOPSY, PROSTATE, RECTAL APPROACH, WITH US GUIDANCE
Anesthesia: Monitor Anesthesia Care | Site: Rectum

## 2017-12-20 MED ORDER — FENTANYL CITRATE (PF) 100 MCG/2ML IJ SOLN
INTRAMUSCULAR | Status: DC | PRN
  Administered 2017-12-20: 50 ug via INTRAVENOUS

## 2017-12-20 MED ORDER — MIDAZOLAM HCL 2 MG/2ML IJ SOLN
INTRAMUSCULAR | Status: DC | PRN
  Administered 2017-12-20: 2 mg via INTRAVENOUS

## 2017-12-20 MED ORDER — SODIUM CHLORIDE 0.9 % IV SOLN
1.0000 g | INTRAVENOUS | Status: AC
  Administered 2017-12-20: 1 g via INTRAVENOUS
  Filled 2017-12-20: qty 10

## 2017-12-20 MED ORDER — PROPOFOL 10 MG/ML IV BOLUS
INTRAVENOUS | Status: DC | PRN
  Administered 2017-12-20 (×2): 20 mg via INTRAVENOUS

## 2017-12-20 MED ORDER — PROPOFOL 500 MG/50ML IV EMUL
INTRAVENOUS | Status: DC | PRN
  Administered 2017-12-20: 75 ug/kg/min via INTRAVENOUS

## 2017-12-20 MED ORDER — ONDANSETRON HCL 4 MG/2ML IJ SOLN
INTRAMUSCULAR | Status: AC
  Filled 2017-12-20: qty 2

## 2017-12-20 MED ORDER — LIDOCAINE HCL (CARDIAC) PF 100 MG/5ML IV SOSY
PREFILLED_SYRINGE | INTRAVENOUS | Status: DC | PRN
  Administered 2017-12-20: 100 mg via INTRAVENOUS

## 2017-12-20 MED ORDER — KETOROLAC TROMETHAMINE 30 MG/ML IJ SOLN: 30.0000 mg | Freq: Once | INTRAMUSCULAR | Status: DC | PRN

## 2017-12-20 MED ORDER — LACTATED RINGERS IV SOLN: INTRAVENOUS | Status: DC

## 2017-12-20 MED ORDER — MEPERIDINE HCL 50 MG/ML IJ SOLN: 6.2500 mg | INTRAMUSCULAR | Status: DC | PRN

## 2017-12-20 MED ORDER — FENTANYL CITRATE (PF) 250 MCG/5ML IJ SOLN
INTRAMUSCULAR | Status: AC
  Filled 2017-12-20: qty 5

## 2017-12-20 MED ORDER — ONDANSETRON HCL 4 MG/2ML IJ SOLN
INTRAMUSCULAR | Status: DC | PRN
  Administered 2017-12-20: 4 mg via INTRAVENOUS

## 2017-12-20 MED ORDER — ONDANSETRON HCL 4 MG/2ML IJ SOLN: 4.0000 mg | Freq: Once | INTRAMUSCULAR | Status: DC | PRN

## 2017-12-20 MED ORDER — SURGILUBE EX GEL
CUTANEOUS | Status: DC | PRN
  Administered 2017-12-20: 1 via TOPICAL

## 2017-12-20 MED ORDER — PROPOFOL 10 MG/ML IV BOLUS
INTRAVENOUS | Status: AC
  Filled 2017-12-20: qty 20

## 2017-12-20 MED ORDER — HYDROMORPHONE HCL 1 MG/ML IJ SOLN
0.2500 mg | INTRAMUSCULAR | Status: DC | PRN
Start: 2017-12-20 — End: 2017-12-20

## 2017-12-20 MED ORDER — HYDROCODONE-ACETAMINOPHEN 7.5-325 MG PO TABS: 1.0000 | ORAL_TABLET | Freq: Once | ORAL | Status: DC | PRN

## 2017-12-20 MED ORDER — MIDAZOLAM HCL 2 MG/2ML IJ SOLN
INTRAMUSCULAR | Status: AC
  Filled 2017-12-20: qty 2

## 2017-12-20 MED ORDER — LACTATED RINGERS IV SOLN
INTRAVENOUS | Status: DC | PRN
  Administered 2017-12-20: 09:00:00 via INTRAVENOUS
  Administered 2017-12-20: 1000 mL

## 2017-12-20 SURGICAL SUPPLY — 4 items
GAUZE SPONGE 4X4 12PLY STRL (GAUZE/BANDAGES/DRESSINGS) ×2 IMPLANT
GLOVE BIOGEL M 8.0 STRL (GLOVE) ×2 IMPLANT
KIT TURNOVER KIT A (KITS) ×2 IMPLANT
LUBRICANT JELLY 4.5OZ STERILE (MISCELLANEOUS) ×2 IMPLANT

## 2017-12-20 NOTE — H&P (Signed)
H&P  Chief Complaint: History of prostate cancer with elevating PSA  History of Present Illness: 67 year old healthy male presents at this time for repeat prostate biopsy.  His history is as follows:  Initially underwent TRUS/Bx 03/07/2013. All biopsies revealed adenocarcinoma, Gleason score 4+3 throughout the prostate. Right seminal vesicle revealed a Gleason score 4+4, left revealed a Gleason score 4+3. He underwent radiographic imaging including a bone scan and CT of the abdomen and pelvis. CT failed to reveal any significant adenopathy or evidence of extra prostatic extension. It was recommended that he get 2 years of androgen deprivation. He received his first [place] on 12/17/2012. He received his 1st 6 month Lupron on 01/17/2013. Because his testosterone was not quite castrate, he was switched from Lupron to [employer]. He received a 6 month injection of that in mid -December, 2014. His last injection was on 09/28/2014.    He does have an elevating trend to his PSA, most recently 2.8. He has stable urinary symptomatology. He has been extremely anxious of undergoing a repeat biopsy.  CT abdomen and pelvis from March, 2019, performed for groin swelling, revealed no adenopathy, no significant prostatic abnormalities.  With a steadily rising PSA, possibly consistent with persistent prostate cancer, he presents at this time for repeat prostate biopsy under anesthesia.   Past Medical History:  Diagnosis Date  . Anxiety   . Bilateral hydronephrosis   . Bladder incontinence    night time,  past 2 months  . BPH (benign prostatic hypertrophy) with urinary obstruction   . Depression   . ED (erectile dysfunction)   . H/O ascites    trace  . Hypertension   . Prostate cancer ([place]) 09/24/12   gleason 4+3=7., & 4+4=8,PSA=67.30, volume=33.7cc    Past Surgical History:  Procedure Laterality Date  . PROSTATE BIOPSY  09/24/12   Adenocarcinoma  . TONSILLECTOMY      Home Medications:    Allergies:  No Known Allergies  Family History  Problem Relation Age of Onset  . Cancer Father        prostate  . Cancer Paternal Grandfather        prostate    Social History:  reports that he has never smoked. He has never used smokeless tobacco. He reports that he drinks about 1.2 oz of alcohol per week. He reports that he does not use drugs.  ROS: A complete review of systems was performed.  All systems are negative except for pertinent findings as noted.  Physical Exam:  Vital signs in last 24 hours:   Constitutional:  Alert and oriented, No acute distress Cardiovascular: Regular rate and rhythm, No JVD Respiratory: Normal respiratory effort, Lungs clear bilaterally GI: Abdomen is soft, nontender, nondistended, no abdominal masses Genitourinary: No CVAT. Normal male phallus, testes are descended bilaterally and non-tender and without masses, scrotum is normal in appearance without lesions or masses, perineum is normal on inspection. Rectal: Normal sphincter tone, no rectal masses, prostate is non tender and without nodularity. Prostate size is estimated to be 80 mL.   Lymphatic: No lymphadenopathy Neurologic: Grossly intact, no focal deficits Psychiatric: Normal mood and affect  Laboratory Data:  Recent Labs    12/18/17 1420  WBC 4.9  HGB 15.9  HCT 48.0  PLT 257    Recent Labs    12/18/17 1420  NA 139  K 4.0  CL 99*  GLUCOSE 128*  BUN 13  CALCIUM 9.4  CREATININE 1.11     No results found for this or any  previous visit (from the past 24 hour(s)). No results found for this or any previous visit (from the past 240 hour(s)).  Renal Function: Recent Labs    12/18/17 1420  CREATININE 1.11   Estimated Creatinine Clearance: 66.9 mL/min (by C-G formula based on SCr of 1.11 mg/dL).  Radiologic Imaging: No results found.  Impression/Assessment:  History of prostate cancer, status post combination radiotherapy/androgen deprivation therapy with steadily rising  PSA  Plan:  Transrectal ultrasound and biopsy of the prostate under anesthesia

## 2017-12-20 NOTE — Transfer of Care (Signed)
Immediate Anesthesia Transfer of Care Note  Patient: Nathan Smith  Procedure(s) Performed: BIOPSY TRANSRECTAL ULTRASONIC PROSTATE (TUBP) (N/A Rectum)  Patient Location: PACU  Anesthesia Type:MAC  Level of Consciousness: awake, alert , oriented and patient cooperative  Airway & Oxygen Therapy: room air  Post-op Assessment: Report given to RN and Post -op Vital signs reviewed and stable  Post vital signs: Reviewed and stable  Last Vitals:  Vitals Value Taken Time  BP    Temp    Pulse    Resp    SpO2      Last Pain:  Vitals:   12/20/17 0843  TempSrc: Oral  PainSc: 0-No pain         Complications: No apparent anesthesia complications

## 2017-12-20 NOTE — Anesthesia Postprocedure Evaluation (Signed)
Anesthesia Post Note  Patient: Nathan Smith  Procedure(s) Performed: BIOPSY TRANSRECTAL ULTRASONIC PROSTATE (TUBP) (N/A Rectum)  Patient location during evaluation: Other Anesthesia Type: MAC Level of consciousness: awake and alert, oriented and patient cooperative Pain management: pain level controlled Vital Signs Assessment: post-procedure vital signs reviewed and stable Respiratory status: spontaneous breathing Cardiovascular status: blood pressure returned to baseline Postop Assessment: no apparent nausea or vomiting Anesthetic complications: no     Last Vitals:  Vitals:   12/20/17 0945 12/20/17 0950  BP: 126/90 122/79  Pulse: 66 64  Resp: 12 20  Temp:  37.1 C  SpO2: 95% 95%    Last Pain:  Vitals:   12/20/17 0950  TempSrc: Oral  PainSc: 0-No pain                 Nathan Smith A

## 2017-12-20 NOTE — Discharge Instructions (Signed)
Take it easy today  OK to resume usual activities tomorrow  Call for fever/other concerns     PATIENT INSTRUCTIONS POST-ANESTHESIA  IMMEDIATELY FOLLOWING SURGERY:  Do not drive or operate machinery for the first twenty four hours after surgery.  Do not make any important decisions for twenty four hours after surgery or while taking narcotic pain medications or sedatives.  If you develop intractable nausea and vomiting or a severe headache please notify your doctor immediately.  FOLLOW-UP:  Please make an appointment with your surgeon as instructed. You do not need to follow up with anesthesia unless specifically instructed to do so.  WOUND CARE INSTRUCTIONS (if applicable):  Keep a dry clean dressing on the anesthesia/puncture wound site if there is drainage.  Once the wound has quit draining you may leave it open to air.  Generally you should leave the bandage intact for twenty four hours unless there is drainage.  If the epidural site drains for more than 36-48 hours please call the anesthesia department.  QUESTIONS?:  Please feel free to call your physician or the hospital operator if you have any questions, and they will be happy to assist you.       Transrectal Ultrasound-Guided Biopsy A transrectal ultrasound-guided biopsy is a procedure to take samples of tissue from your prostate. Ultrasound images are used to guide the procedure. It is usually done to check the prostate gland for cancer. What happens before the procedure?  Do not eat or drink after midnight on the night before your procedure.  Take medicines as your doctor tells you.  Your doctor may have you stop taking some medicines 5-7 days before the procedure.  You will be given an enema before your procedure. During an enema, a liquid is put into your butt (rectum) to clear out waste.  You may have lab tests the day of your procedure.  Make plans to have someone drive you home. What happens during the  procedure?  You will be given medicine to help you relax before the procedure. An IV tube will be put into one of your veins. It will be used to give fluids and medicine.  You will be given medicine to reduce the risk of infection (antibiotic).  You will be placed on your side.  A probe with gel will be put in your butt. This is used to take pictures of your prostate and the area around it.  A medicine to numb the area is put into your prostate.  A biopsy needle is then inserted and guided to your prostate.  Samples of prostate tissue are taken. The needle is removed.  The samples are sent to a lab to be checked. Results are usually back in 2-3 days. What happens after the procedure?  You will be taken to a room where you will be watched until you are doing okay.  You may have some pain in the area around your butt. You will be given medicines for this.  You may be able to go home the same day. Sometimes, an overnight stay in the hospital is needed. This information is not intended to replace advice given to you by your health care provider. Make sure you discuss any questions you have with your health care provider. Document Released: 07/12/2009 Document Revised: 12/30/2015 Document Reviewed: 03/12/2013 Elsevier Interactive Patient Education  [employer].

## 2017-12-20 NOTE — Anesthesia Preprocedure Evaluation (Signed)
Anesthesia Evaluation  Patient identified by MRN, date of birth, ID band Patient awake    Reviewed: Allergy & Precautions, H&P , NPO status , Patient's Chart, lab work & pertinent test results, reviewed documented beta blocker date and time   Airway Mallampati: II  TM Distance: >3 FB Neck ROM: full    Dental no notable dental hx.    Pulmonary neg pulmonary ROS,    Pulmonary exam normal breath sounds clear to auscultation       Cardiovascular Exercise Tolerance: Good hypertension, negative cardio ROS   Rhythm:regular Rate:Normal     Neuro/Psych Anxiety Depression negative neurological ROS  negative psych ROS   GI/Hepatic negative GI ROS, Neg liver ROS,   Endo/Other  negative endocrine ROS  Renal/GU Renal diseasenegative Renal ROS  negative genitourinary   Musculoskeletal   Abdominal   Peds  Hematology negative hematology ROS (+)   Anesthesia Other Findings   Reproductive/Obstetrics negative OB ROS                             Anesthesia Physical Anesthesia Plan  ASA: II  Anesthesia Plan: MAC   Post-op Pain Management:    Induction:   PONV Risk Score and Plan:   Airway Management Planned:   Additional Equipment:   Intra-op Plan:   Post-operative Plan:   Informed Consent: I have reviewed the patients History and Physical, chart, labs and discussed the procedure including the risks, benefits and alternatives for the proposed anesthesia with the patient or authorized representative who has indicated his/her understanding and acceptance.   Dental Advisory Given  Plan Discussed with: CRNA  Anesthesia Plan Comments:         Anesthesia Quick Evaluation

## 2017-12-20 NOTE — Op Note (Signed)
Preoperative diagnosis: History of prostate cancer (status post combination external beam radiotherapy/androgen deprivation therapy) with rising PSA post treatment  Postop diagnosis: Same  Principal procedure: Transrectal ultrasound of the prostate, prostate biopsies x12, ultrasound directed biopsy  Surgeon: [name] Woehler  Anesthesia: Monitored anesthesia care  Complications: None  Specimens: Prostate biopsies, 12 cores, to pathology  Estimated blood loss: Less than 25 mL  Indications: 67 year old male with history of prostate cancer.  This was diagnosed in August 2014 upon presentation with a PSA of approximately 70, bladder outlet obstruction with bilateral hydroureteronephrosis.  The patient was subsequently treated with Foley catheter, and following biopsies underwent combination radiotherapy/androgen deprivation therapy.  He reached a PSA nadir, but over the past year or 2 has experienced increasing PSA, most recently up to 2.8 in December 2018.  He presents at this time for repeat ultrasound and biopsy.  The procedure is known to the patient, and I have explained the risks and complications to him.  He understands these and agrees to proceed.  Description of procedure: The patient was identified in the holding area.  He received preoperative IV antibiotics.  Was taken to the operating room where IV sedation was administered.  He was placed in the left lateral decubitus position with his knees/hips flexed.  Proper timeout was performed.  The transrectal ultrasound probe was introduced into his rectum.  Sequential scans of the prostate were taken.  Prostatic volume was 38 mL.  Fiducial markers were identified.  There were scattered calcifications.  There were no specific hypoechoic areas within the peripheral or transition zone.  Following scanning/measurements, 12 cores were taken as follows: Right base lateral, right base medial, right mid lateral, right mid medial, right apex lateral, right apex  medial, left base lateral, left base medial, left mid lateral, left mid medial, left apex lateral, left mid medial.  Following adequate biopsies, the probe was removed.  Pressure was held on the anus for several seconds, and he was returned to the recumbent position.  He was then awakened and taken to the PACU in stable condition, having tolerated the procedure well.

## 2017-12-21 ENCOUNTER — Encounter (HOSPITAL_COMMUNITY): Payer: Self-pay | Admitting: Urology

## 2018-02-12 ENCOUNTER — Other Ambulatory Visit: Payer: Self-pay | Admitting: Urology

## 2018-02-12 DIAGNOSIS — C61 Malignant neoplasm of prostate: Secondary | ICD-10-CM

## 2018-02-12 DIAGNOSIS — R9721 Rising PSA following treatment for malignant neoplasm of prostate: Secondary | ICD-10-CM

## 2018-02-25 ENCOUNTER — Other Ambulatory Visit (HOSPITAL_COMMUNITY): Payer: PRIVATE HEALTH INSURANCE

## 2018-02-25 ENCOUNTER — Encounter (HOSPITAL_COMMUNITY)
Admission: RE | Admit: 2018-02-25 | Discharge: 2018-02-25 | Disposition: A | Discharge status: Home / Self Care | Payer: Medicare Other | Source: Ambulatory Visit | Attending: Urology | Admitting: Urology

## 2018-02-25 DIAGNOSIS — R9721 Rising PSA following treatment for malignant neoplasm of prostate: Secondary | ICD-10-CM

## 2018-02-25 DIAGNOSIS — C61 Malignant neoplasm of prostate: Secondary | ICD-10-CM | POA: Diagnosis present

## 2018-02-25 MED ORDER — FLUDEOXYGLUCOSE F - 18 (FDG) INJECTION
10.0000 | Freq: Once | INTRAVENOUS | Status: AC | PRN
  Administered 2018-02-25: 9.68 via INTRAVENOUS

## 2018-03-04 ENCOUNTER — Telehealth: Payer: Self-pay | Admitting: Medical Oncology

## 2018-03-04 NOTE — Telephone Encounter (Signed)
Introduced myself as the Prostate Art therapist and the Coordinator of the [facility].  1. I confirmed with the patient he is aware of his referral to the clinic 03/15/18 arriving at 8:15am.   2. I discussed the format of the clinic and the physicians he will be seeing that day.  3. I discussed where the clinic is located and how to contact me.  4. I confirmed his address and informed him I would be mailing a packet of information and forms to be completed. I asked him to bring them with him the day of his appointment.   He voiced understanding of the above. I asked him to call me if he has any questions or concerns regarding his appointments or the forms he needs to complete.

## 2018-03-04 NOTE — Telephone Encounter (Signed)
Left message regarding referral to the [facility]. I asked him to return my call to discuss further.

## 2018-03-11 ENCOUNTER — Encounter: Payer: Self-pay | Admitting: Medical Oncology

## 2018-03-14 ENCOUNTER — Telehealth: Payer: Self-pay | Admitting: Medical Oncology

## 2018-03-14 NOTE — Progress Notes (Signed)
GU Location of Tumor / Histology: prostatic adenocarcinoma   If Prostate Cancer, Gleason Score is (4 + 4) and PSA is (78) pre treatment.      Biopsies of prostate (if applicable) revealed:    Past/Anticipated interventions by medical oncology, if any: no  Weight changes, if any: no  Bowel/Bladder complaints, if any: IPSS 4. SHIM 4.    Nausea/Vomiting, if any: no  Pain issues, if any:  no  SAFETY ISSUES: Prior radiation?  Radiation treatment dates:   05/27/2013 through 07/28/2013 Site/dose:    1.  The patient's prostate, seminal vesicles, and pelvic lymph nodes were initially treated to 45 gray in 25 fractions of 1.8 gray  2.  the patient's prostate alone was boosted to 75 gray with 15 additional fractions of 2 gray  Pacemaker/ICD? no  Possible current pregnancy? no  Is the patient on methotrexate? no  Current Complaints / other details:  67 year old male. Single.

## 2018-03-14 NOTE — Telephone Encounter (Signed)
Left a message with patient to remind him of prostate clinic 8/9 arriving at 8 am. I reviewed [place] parking, registration and I asked him to bring his completed medical forms.

## 2018-03-15 ENCOUNTER — Other Ambulatory Visit: Payer: Self-pay

## 2018-03-15 ENCOUNTER — Encounter: Payer: Self-pay | Admitting: Urology

## 2018-03-15 ENCOUNTER — Encounter: Payer: Self-pay | Admitting: Medical Oncology

## 2018-03-15 ENCOUNTER — Encounter: Payer: Self-pay | Admitting: Radiation Oncology

## 2018-03-15 ENCOUNTER — Ambulatory Visit
Admission: RE | Admit: 2018-03-15 | Discharge: 2018-03-15 | Disposition: A | Discharge status: Home / Self Care | Payer: Medicare Other | Source: Ambulatory Visit | Attending: Radiation Oncology | Admitting: Radiation Oncology

## 2018-03-15 ENCOUNTER — Inpatient Hospital Stay: Payer: Medicare Other | Attending: Oncology | Admitting: Oncology

## 2018-03-15 ENCOUNTER — Inpatient Hospital Stay
Admission: RE | Admit: 2018-03-15 | Discharge: 2018-03-15 | Disposition: A | Discharge status: Home / Self Care | Payer: Medicare Other | Source: Ambulatory Visit | Attending: Radiation Oncology | Admitting: Radiation Oncology

## 2018-03-15 VITALS — BP 137/90 | HR 70 | Temp 98.5°F | Resp 18 | Ht 75.0 in | Wt 154.8 lb

## 2018-03-15 DIAGNOSIS — F419 Anxiety disorder, unspecified: Secondary | ICD-10-CM | POA: Diagnosis not present

## 2018-03-15 DIAGNOSIS — E291 Testicular hypofunction: Secondary | ICD-10-CM | POA: Diagnosis not present

## 2018-03-15 DIAGNOSIS — C61 Malignant neoplasm of prostate: Secondary | ICD-10-CM | POA: Insufficient documentation

## 2018-03-15 DIAGNOSIS — I10 Essential (primary) hypertension: Secondary | ICD-10-CM | POA: Insufficient documentation

## 2018-03-15 DIAGNOSIS — Z79899 Other long term (current) drug therapy: Secondary | ICD-10-CM

## 2018-03-15 DIAGNOSIS — Z8042 Family history of malignant neoplasm of prostate: Secondary | ICD-10-CM | POA: Diagnosis not present

## 2018-03-15 DIAGNOSIS — F329 Major depressive disorder, single episode, unspecified: Secondary | ICD-10-CM | POA: Diagnosis not present

## 2018-03-15 DIAGNOSIS — N400 Enlarged prostate without lower urinary tract symptoms: Secondary | ICD-10-CM | POA: Diagnosis not present

## 2018-03-15 DIAGNOSIS — Z923 Personal history of irradiation: Secondary | ICD-10-CM | POA: Insufficient documentation

## 2018-03-15 NOTE — Progress Notes (Signed)
                               Care Plan Summary  Name: Nathan Smith DOB: Oct 12, 1950   Your Medical Team:   Urologist -  Dr. Raynelle Bring, [facility]  Radiation Oncologist - Dr. Tyler Pita, [facility]   Medical Oncologist - Dr. Zola Button, [place]  Recommendations: 1) Bone scan for restaging 2) Androgen deprivation  3) Salvage prostatectomy or cryo  * These recommendations are based on information available as of today's consult.      Recommendations may change depending on the results of further tests or exams.  Next Steps: 1) Dr. Diona Fanti will order bone scan  2) Consider your options   When appointments need to be scheduled, you will be contacted by [facility] and/or [facility].  Questions?  Please do not hesitate to call Cira Rue, RN, BSN, OCN at [phone] any questions or concerns.  Shirlean Mylar is your Oncology Nurse Navigator and is available to assist you while you're receiving your medical care at [facility].
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
## 2018-03-18 ENCOUNTER — Telehealth: Payer: Self-pay

## 2018-03-18 NOTE — Telephone Encounter (Signed)
per 8/9 no los

## 2018-03-19 ENCOUNTER — Encounter: Payer: Self-pay | Admitting: General Practice

## 2018-03-19 NOTE — Progress Notes (Signed)
[place] participated in [facility] to introduce [place] team/resources, reviewing distress screen per protocol.  The patient scored a 7 on the Psychosocial Distress Thermometer which indicates severe distress.   ONCBCN DISTRESS SCREENING 03/19/2018  Screening Type Initial Screening  Distress experienced in past week (1-10) 7  Practical problem type Housing  Emotional problem type Depression;Nervousness/Anxiety;Isolation/feeling alone  Spiritual/Religous concerns type Facing my mortality  Referral to support programs Yes    Follow up needed: Yes.   Mr Ranta received full packet of [place]. Also LVM of introduction, encouraging callback. Plan to try again by phone if pt does not return call, but please also page if needs arise or circumstances change. Thank you.   [place], [place], [facility] Pager [phone] Voicemail [phone]

## 2018-03-20 ENCOUNTER — Encounter: Payer: Self-pay | Admitting: General Practice

## 2018-03-20 NOTE — Progress Notes (Signed)
[place] Spiritual Care Note  Received return call from Mr Lama, who verbalized appreciation both for call and for availability of support. He is aware of [place]; encouraged him also to ask about [facility]'s resources (particularly mentors/support groups) when he seeks information about trial eligibility and cryo tx because he identifies a strong desire to learn from other patients. Encouraged creative, community-building support programs as a means for making meaning and positive distraction.   Pt is aware of ongoing Support Team availability, but please also page if needs arise or circumstances change. Thank you.   [place], [place], [facility] Pager [phone] Voicemail [phone]

## 2018-03-25 ENCOUNTER — Encounter: Payer: Self-pay | Admitting: General Practice

## 2018-03-25 NOTE — Progress Notes (Signed)
[place] Spiritual Care Note  Mailed Mr Quillin a [place] massage therapy packet, including a handwritten note, per pt request.    Chaplain Lorrin Jackson, [place], [facility] Pager [phone] Voicemail [phone]

## 2018-03-28 ENCOUNTER — Telehealth: Payer: Self-pay | Admitting: Medical Oncology

## 2018-03-28 NOTE — Telephone Encounter (Signed)
Left a message to follow up post PMDC. I asked him to call me with an update on referral to [facility] with Dr. Iona Beard.

## 2018-03-29 ENCOUNTER — Encounter: Payer: Self-pay | Admitting: Medical Oncology

## 2018-04-04 ENCOUNTER — Other Ambulatory Visit: Payer: Self-pay | Admitting: Urology

## 2018-04-04 DIAGNOSIS — R9721 Rising PSA following treatment for malignant neoplasm of prostate: Secondary | ICD-10-CM

## 2018-04-12 ENCOUNTER — Encounter (HOSPITAL_COMMUNITY)
Admission: RE | Admit: 2018-04-12 | Discharge: 2018-04-12 | Disposition: A | Discharge status: Home / Self Care | Payer: Medicare Other | Source: Ambulatory Visit | Attending: Urology | Admitting: Urology

## 2018-04-12 DIAGNOSIS — R9721 Rising PSA following treatment for malignant neoplasm of prostate: Secondary | ICD-10-CM | POA: Diagnosis not present

## 2018-04-12 MED ORDER — TECHNETIUM TC 99M MEDRONATE IV KIT
20.0000 | PACK | Freq: Once | INTRAVENOUS | Status: AC | PRN
  Administered 2018-04-12: 22 via INTRAVENOUS

## 2018-04-25 ENCOUNTER — Telehealth: Payer: Self-pay | Admitting: Medical Oncology

## 2018-04-25 NOTE — Progress Notes (Signed)
Spoke with [name] and he states he has not been scheduled for the bone scan. I contacted Dr. Alan Ripper office and explained he has an appointment 9/06 with Dr. Renee Rival and will need the bone scan results. She will contact the patient today with an appointment. [name] is aware.

## 2018-04-25 NOTE — Telephone Encounter (Signed)
Left a message to inform patient that Dr. Alen Blew signed form to get free massages and I left it with Ottis Stain. I asked him to call the number in his packet to schedule appointments. I asked him to call me back with questions or concerns.

## 2018-09-22 IMAGING — NM NM BONE WHOLE BODY
2 series · 2 of 2 positions shown · non-contrast
Comparison: 10/21/2012

Correlation: PET-CT 02/25/2018

CLINICAL DATA: Prostate cancer

EXAM:
NUCLEAR MEDICINE WHOLE BODY BONE SCAN
TECHNIQUE: Whole body anterior and posterior images were obtained approximately
3 hours after intravenous injection of radiopharmaceutical.
RADIOPHARMACEUTICALS:  22 mCi Cechnetium-LLm MDP IV

[Series 1: wbr_bone_40 whole body · 2.66mm/px · 1 of 1 slices shown (1 of 2)]
[im 1/1]
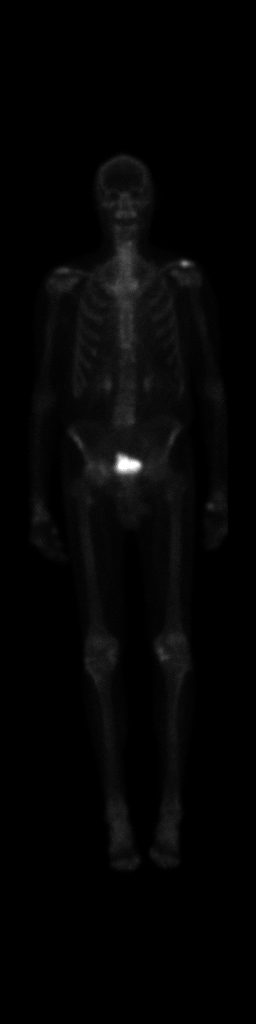

[Series 1: wbr_bone_40 whole body · 2.66mm/px · 1 of 1 slices shown (2 of 2)]
[im 1/1]
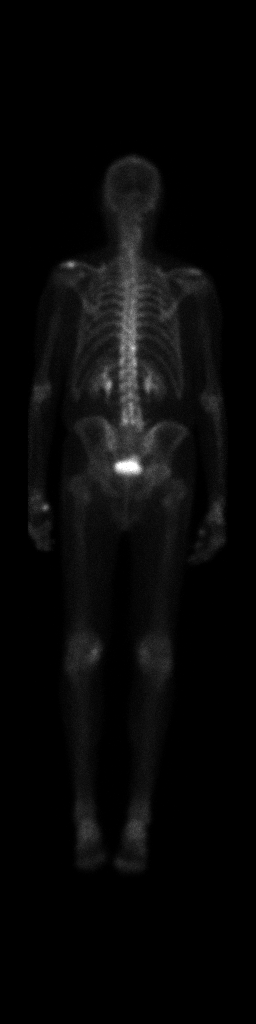

[2 of 2 positions shown; findings below may reference images not displayed]

FINDINGS: Uptake at the shoulders, BILATERAL wrists, RIGHT hip, and BILATERAL
knees, typically degenerative.

No additional sites of abnormal osseous tracer accumulation are
identified to suggest osseous metastatic disease.

Large photopenic region at sacrum question prior radiation therapy.

Minimal uptake of tracer at the lesser trochanters bilaterally which
could be physiologic related to stress reaction.

Expected urinary tract and soft tissue distribution of tracer.
IMPRESSION: Photopenic region at the sacrum suspect prior radiation therapy.

No definite scintigraphic evidence of osseous metastatic disease.

## 2019-05-13 ENCOUNTER — Ambulatory Visit (INDEPENDENT_AMBULATORY_CARE_PROVIDER_SITE_OTHER): Payer: Medicare Other | Admitting: Urology

## 2019-05-13 DIAGNOSIS — N401 Enlarged prostate with lower urinary tract symptoms: Secondary | ICD-10-CM | POA: Diagnosis not present

## 2019-05-13 DIAGNOSIS — N5201 Erectile dysfunction due to arterial insufficiency: Secondary | ICD-10-CM

## 2019-05-13 DIAGNOSIS — C61 Malignant neoplasm of prostate: Secondary | ICD-10-CM

## 2019-05-13 DIAGNOSIS — R9721 Rising PSA following treatment for malignant neoplasm of prostate: Secondary | ICD-10-CM

## 2019-05-15 ENCOUNTER — Telehealth: Payer: Self-pay | Admitting: Urology

## 2019-05-15 NOTE — Telephone Encounter (Signed)
NO PA REQUIRED FOR [facility] PT HAS APP ON 07-15-19 IN [place]

## 2019-07-15 ENCOUNTER — Ambulatory Visit (INDEPENDENT_AMBULATORY_CARE_PROVIDER_SITE_OTHER): Payer: Medicare Other | Admitting: Urology

## 2019-07-15 DIAGNOSIS — C61 Malignant neoplasm of prostate: Secondary | ICD-10-CM | POA: Diagnosis not present

## 2019-07-15 DIAGNOSIS — N401 Enlarged prostate with lower urinary tract symptoms: Secondary | ICD-10-CM | POA: Diagnosis not present

## 2019-07-15 DIAGNOSIS — R9721 Rising PSA following treatment for malignant neoplasm of prostate: Secondary | ICD-10-CM

## 2019-10-14 ENCOUNTER — Ambulatory Visit: Payer: Medicare Other | Admitting: Urology

## 2019-11-26 ENCOUNTER — Other Ambulatory Visit: Payer: Self-pay | Admitting: Urology

## 2019-11-27 ENCOUNTER — Other Ambulatory Visit: Payer: Self-pay | Admitting: Urology

## 2019-11-27 LAB — TESTOSTERONE: Testosterone: 18 ng/dL — ABNORMAL LOW (ref 250–827)

## 2019-11-27 LAB — PSA: PSA: 11.1 ng/mL — ABNORMAL HIGH (ref <=4.0)

## 2019-12-02 ENCOUNTER — Other Ambulatory Visit: Payer: Self-pay

## 2019-12-02 ENCOUNTER — Ambulatory Visit (INDEPENDENT_AMBULATORY_CARE_PROVIDER_SITE_OTHER): Payer: Medicare Other | Admitting: Urology

## 2019-12-02 ENCOUNTER — Encounter: Payer: Self-pay | Admitting: Urology

## 2019-12-02 VITALS — BP 154/79 | HR 63 | Temp 95.4°F | Ht 74.0 in | Wt 160.0 lb

## 2019-12-02 DIAGNOSIS — C61 Malignant neoplasm of prostate: Secondary | ICD-10-CM | POA: Diagnosis not present

## 2019-12-02 LAB — POCT URINALYSIS DIPSTICK
Bilirubin, UA: NEGATIVE
Blood, UA: NEGATIVE
Glucose, UA: NEGATIVE
Ketones, UA: NEGATIVE
Leukocytes, UA: NEGATIVE
Nitrite, UA: NEGATIVE
Protein, UA: NEGATIVE
Spec Grav, UA: <=1.005 — AB (ref 1.010–1.025)
Urobilinogen, UA: NEGATIVE E.U./dL — AB
pH, UA: 8.5 — AB (ref 5.0–8.0)

## 2019-12-02 MED ORDER — TAMSULOSIN HCL 0.4 MG PO CAPS: 0.4000 mg | ORAL_CAPSULE | Freq: Every day | ORAL | 3 refills | Status: DC

## 2019-12-02 NOTE — Progress Notes (Signed)
See H& P.

## 2019-12-02 NOTE — Progress Notes (Signed)
H&P  Chief Complaint: Prostate Cancer  History of Present Illness:   4.27.2021: Most recent PSA 11.1, T level 18. Stable symptomatology. He denies any new bony aches/pain though he does c/o some abdominal aching (he thinks this is mostly dietary). Weight remains stable. He denies any blood per urine or stool. Stable bowel pattern.  (below copied from [facility] records):  Prostate Cancer:  Nathan Smith is a 69 year-old male established patient who is here evaluation for treatment of prostate cancer.  His prostate cancer was diagnosed approximately 03/07/2013. He does have the pathology report from his biopsy. His cancer was diagnosed by Cabella Kimm. His PSA at his time of diagnosis was 39. His most recent PSA is 5.7.   He has undergone External Beam Radiation Therapy for treatment. He has undergone Hormonal Therapy for treatment.   Initially underwent TRUS/Bx 8.1.2014. All biopsies revealed adenocarcinoma, GS 4+3 throughout the prostate. Right seminal vesicle revealed a GS 4+4, left revealed a GS 4+3. He underwent radiographic imaging including a bone scan and CT of the abdomen and pelvis. CT failed to reveal any significant adenopathy or evidence of extra prostatic extension. It was recommended that he get 2 years of androgen deprivation. He received his first [place] on 5.13.2014. He received his 1st 6 month Lupron on 6.13.2014. Because his testosterone was not quite castrate, he was switched from Lupron to [employer]. He received a 6 month injection of that in mid -December, 2014. His last injection was on 2.22.2016.   12.11.2018: Elevating trend was PSA, most recently 2.8. He has stable urinary symptomatology. He has been extremely anxious of undergoing a repeat biopsy.   10.6.2020: He returns today for follow-up -- he has been being seen by [facility] for his prostate cancer. He was initiated onADT. Most recent PSA was 5.2. Most recent CT and bone scan apparently showed no signs of metastatic disease  (2019) -- he has had no repeat imaging. He is not very happy on lupron -- he is having hot flashes 2x a day. He is due his next 6 mo lupron this November.   12.8.2020: PSA 5.7 on 12.1.2020. He reports having some okay days and some awful days with irritability, low energy, and fatigue -- he attributes this to lupron. Testosterone (12.1) measured 29. He has been having surveillance [facility] at [facility]. He is due 6 mo lupron today.   BPH:  He is now off of medical therapy for BPH. He denies significant LUTS.   Past Medical History:  Diagnosis Date  . Anxiety   . Bilateral hydronephrosis   . Bladder incontinence    night time,  past 2 months  . BPH (benign prostatic hypertrophy) with urinary obstruction   . Depression   . ED (erectile dysfunction)   . H/O ascites    trace  . Hypertension   . Prostate cancer ([place]) 09/24/12   gleason 4+3=7., & 4+4=8,PSA=67.30, volume=33.7cc    Past Surgical History:  Procedure Laterality Date  . PROSTATE BIOPSY  09/24/12   Adenocarcinoma  . PROSTATE BIOPSY N/A 12/20/2017   Procedure: BIOPSY TRANSRECTAL ULTRASONIC PROSTATE (TUBP);  Surgeon: Franchot Gallo, MD;  Location: [facility];  Service: Urology;  Laterality: N/A;  . TONSILLECTOMY      Home Medications:  Allergies as of 12/02/2019   No Known Allergies     Medication List       Accurate as of December 02, 2019  3:48 PM. If you have any questions, ask your nurse or doctor.  Align 4 MG Caps Take by mouth.   cholecalciferol 25 MCG (1000 UNIT) tablet Commonly known as: VITAMIN D3 Take 1,000 Units by mouth daily.   Curcumin 95 500 MG Caps Generic drug: Turmeric Take by mouth.   Ginseng 100 MG Caps Take by mouth.   glucosamine-chondroitin 500-400 MG tablet Take 1 tablet by mouth 3 (three) times daily.   L-ARGININE PO Take 500 mg by mouth daily.   losartan 25 MG tablet Commonly known as: COZAAR Take 25 mg by mouth daily.   melatonin 1 MG Tabs tablet Take by mouth.     Probiotic 1-250 BILLION-MG Caps Take by mouth 1 day or 1 dose.   RHODIOLA PO Take 1 capsule by mouth daily.   SAW PALMETTO COMPLEX PO Take 1 capsule by mouth daily.   tamsulosin 0.4 MG Caps capsule Commonly known as: FLOMAX Take 0.4 mg by mouth daily.   trolamine salicylate 10 % cream Commonly known as: ASPERCREME Apply 1 application topically 2 (two) times daily as needed for muscle pain.   vitamin C 1000 MG tablet Take 1,000 mg by mouth daily.   zinc gluconate 50 MG tablet Take 50 mg by mouth daily.       Allergies: No Known Allergies  Family History  Problem Relation Age of Onset  . Cancer Father        prostate  . Cancer Paternal Grandfather        prostate    Social History:  reports that he has never smoked. He has never used smokeless tobacco. He reports current alcohol use of about 2.0 standard drinks of alcohol per week. He reports that he does not use drugs.  ROS:  Urological Symptom Review  Patient is experiencing the following symptoms: Get up at night to urinate Review of Systems Gastrointestinal (upper)  : Negative for upper GI symptoms Gastrointestinal (lower) : Diarrhea Constitutional : Fatigue Skin: Negative for skin symptoms Eyes: Negative for eye symptoms Ear/Nose/Throat : Negative for Ear/Nose/Throat symptoms Hematologic/Lymphatic: Negative for Hematologic/Lymphatic symptoms Cardiovascular : Negative for cardiovascular symptoms Respiratory : Negative for respiratory symptoms Endocrine: Negative for endocrine symptoms Musculoskeletal: Negative for musculoskeletal symptoms Neurological: Negative for neurological symptoms Psychologic: Negative for psychiatric symptoms  Physical Exam:  Vital signs in last 24 hours: BP (!) 154/79   Pulse 63   Temp (!) 95.4 F (35.2 C)   Ht 6\' 2"  (1.88 m)   Wt 160 lb (72.6 kg)   BMI 20.54 kg/m  Constitutional:  Alert and oriented, No acute distress Cardiovascular: Regular rate   Respiratory: Normal respiratory effort GI: Abdomen is soft, nontender, nondistended, no abdominal masses. No CVAT.  Genitourinary: Prostate feels around 60 grams in size. Lymphatic: No lymphadenopathy Neurologic: Grossly intact, no focal deficits Psychiatric: Normal mood and affect  Laboratory Data:  No results for input(s): WBC, HGB, HCT, PLT in the last 72 hours.  No results for input(s): NA, K, CL, GLUCOSE, BUN, CALCIUM, CREATININE in the last 72 hours.  Invalid input(s): CO3   Results for orders placed or performed in visit on 12/02/19 (from the past 24 hour(s))  POCT urinalysis dipstick     Status: Abnormal   Collection Time: 12/02/19  2:58 PM  Result Value Ref Range   Color, UA lt yellow    Clarity, UA clear    Glucose, UA Negative Negative   Bilirubin, UA neg    Ketones, UA neg    Spec Grav, UA <=1.005 (A) 1.010 - 1.025   Blood, UA neg  pH, UA 8.5 (A) 5.0 - 8.0   Protein, UA Negative Negative   Urobilinogen, UA negative (A) 0.2 or 1.0 E.U./dL   Nitrite, UA neg    Leukocytes, UA Negative Negative   Appearance clear    Odor     No results found for this or any previous visit (from the past 240 hour(s)).  Renal Function: No results for input(s): CREATININE in the last 168 hours. CrCl cannot be calculated (Patient's most recent lab result is older than the maximum 21 days allowed.).  Radiologic Imaging: No results found.  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 I do think it is time to consider starting him on 2nd line Rx  Plan:  1. Return in 2 mo for OV w/ 6 mo lupron and PSA check  2. We will refer him to [place] center to start him on advanced medical therapy for his now castrate resistant prostate cancer  3. Will call to schedule CT a/p and bone scan.   4. Continue on calcium supplement.

## 2019-12-04 ENCOUNTER — Encounter: Payer: Self-pay | Admitting: Urology

## 2019-12-08 ENCOUNTER — Encounter (HOSPITAL_COMMUNITY): Payer: Self-pay | Admitting: Surgery

## 2019-12-08 ENCOUNTER — Other Ambulatory Visit: Payer: Self-pay

## 2019-12-09 ENCOUNTER — Inpatient Hospital Stay (HOSPITAL_COMMUNITY): Payer: Medicare Other | Attending: Hematology | Admitting: Hematology

## 2019-12-09 ENCOUNTER — Encounter (HOSPITAL_COMMUNITY): Payer: Self-pay | Admitting: Hematology

## 2019-12-09 VITALS — BP 156/82 | HR 74 | Temp 97.1°F | Resp 12 | Ht 73.0 in | Wt 154.9 lb

## 2019-12-09 DIAGNOSIS — Z79899 Other long term (current) drug therapy: Secondary | ICD-10-CM | POA: Insufficient documentation

## 2019-12-09 DIAGNOSIS — C61 Malignant neoplasm of prostate: Secondary | ICD-10-CM | POA: Diagnosis present

## 2019-12-09 DIAGNOSIS — I10 Essential (primary) hypertension: Secondary | ICD-10-CM | POA: Insufficient documentation

## 2019-12-09 DIAGNOSIS — Z923 Personal history of irradiation: Secondary | ICD-10-CM | POA: Diagnosis not present

## 2019-12-09 NOTE — Patient Instructions (Addendum)
[place] at [facility] Discharge Instructions  You were seen today by Dr. Delton Coombes. He went over your history, family history and how you've been feeling lately. He will schedule you for a Axumin PET scan to be done in [place]. He will see you back after your scan for follow up.   Thank you for choosing [place] at [facility] to provide your oncology and hematology care.  To afford each patient quality time with our provider, please arrive at least 15 minutes before your scheduled appointment time.   If you have a lab appointment with the [facility] please come in thru the  Main Entrance and check in at the main information desk  You need to re-schedule your appointment should you arrive 10 or more minutes late.  We strive to give you quality time with our providers, and arriving late affects you and other patients whose appointments are after yours.  Also, if you no show three or more times for appointments you may be dismissed from the clinic at the providers discretion.     Again, thank you for choosing [facility].  Our hope is that these requests will decrease the amount of time that you wait before being seen by our physicians.       _____________________________________________________________  Should you have questions after your visit to [facility], please contact our office at [phone] between the hours of 8:00 a.m. and 4:30 p.m.  Voicemails left after 4:00 p.m. will not be returned until the following business day.  For prescription refill requests, have your pharmacy contact our office and allow 72 hours.

## 2019-12-09 NOTE — Assessment & Plan Note (Addendum)
1.  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 -External beam radiation therapy with 2 years of Lupron from 05/27/2013 through 07/28/2013. -He received Lupron until 09/28/2014. -In December 2018, he had rising PSA levels, 2.8.  Local recurrence was confirmed with biopsy.  Bone scan on 04/12/2018 did not show any evidence of metastatic disease. -He was evaluated by Dr. Iona Beard at [facility].  Bone scan and CT scan at [facility] on 07/02/2018 did not show any evidence of metastatic disease.  Left para-aortic lymph node measures 8 mm.  Lupron started for nonmetastatic HSPC in November 2019 for rapid PSA doubling time of less than 3 months. -PSA 5.7 on 07/08/2019.  He had difficulty tolerating Lupron with irritability, low energy and fatigue. -Latest PSA on 11/26/2019 was 11.1 with testosterone 18. -He needs staging scans.  As conventional scans did not show any evidence of metastatic disease previously, I have recommended next-generation scans. -We will order fluciclovine PET CT scan as PSMA PET scan is not available locally. -We will see him back after the scan to discuss results.  He will continue Lupron injections with Dr. Diona Fanti.  2.  Family history: -He thinks his father might have had prostate cancer but was not 100% sure. -We will consider genetic testing.  3.  Hypertension: -He was taking losartan 25 mg daily and ran out of it 3 weeks ago.  His blood pressure today is 156/82.  He reports that his blood pressure is always high at physician's office. -I have advised him to check blood pressure at home and bring [place] back the readings next time.  We will start him back on losartan if his blood pressures are remaining high.
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
## 2019-12-10 ENCOUNTER — Encounter (HOSPITAL_COMMUNITY): Payer: Medicare Other

## 2019-12-10 ENCOUNTER — Other Ambulatory Visit (HOSPITAL_COMMUNITY): Payer: Medicare Other

## 2019-12-23 ENCOUNTER — Other Ambulatory Visit: Payer: Self-pay

## 2019-12-23 ENCOUNTER — Other Ambulatory Visit (HOSPITAL_COMMUNITY): Payer: Medicare Other

## 2019-12-23 ENCOUNTER — Ambulatory Visit (HOSPITAL_COMMUNITY)
Admission: RE | Admit: 2019-12-23 | Discharge: 2019-12-23 | Disposition: A | Discharge status: Home / Self Care | Payer: Medicare Other | Source: Ambulatory Visit | Attending: Hematology | Admitting: Hematology

## 2019-12-23 DIAGNOSIS — C61 Malignant neoplasm of prostate: Secondary | ICD-10-CM | POA: Insufficient documentation

## 2019-12-23 MED ORDER — AXUMIN (FLUCICLOVINE F 18) INJECTION
9.6800 | Freq: Once | INTRAVENOUS | Status: AC
  Administered 2019-12-23: 9.68 via INTRAVENOUS

## 2019-12-25 ENCOUNTER — Telehealth (HOSPITAL_COMMUNITY): Payer: Self-pay | Admitting: Pharmacy Technician

## 2019-12-25 ENCOUNTER — Inpatient Hospital Stay (HOSPITAL_BASED_OUTPATIENT_CLINIC_OR_DEPARTMENT_OTHER): Payer: Medicare Other | Admitting: Hematology

## 2019-12-25 ENCOUNTER — Other Ambulatory Visit: Payer: Self-pay

## 2019-12-25 VITALS — BP 162/88 | HR 71 | Temp 97.3°F | Resp 14 | Wt 154.3 lb

## 2019-12-25 DIAGNOSIS — C61 Malignant neoplasm of prostate: Secondary | ICD-10-CM | POA: Diagnosis not present

## 2019-12-25 MED ORDER — ABIRATERONE ACETATE 250 MG PO TABS: 1000.0000 mg | ORAL_TABLET | Freq: Every day | ORAL | 2 refills | Status: DC

## 2019-12-25 MED ORDER — LOSARTAN POTASSIUM 25 MG PO TABS: 25.0000 mg | ORAL_TABLET | Freq: Every day | ORAL | 6 refills | Status: DC

## 2019-12-25 MED ORDER — PREDNISONE 5 MG PO TABS: 5.0000 mg | ORAL_TABLET | Freq: Every day | ORAL | 2 refills | Status: DC
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
## 2019-12-25 NOTE — Patient Instructions (Addendum)
[place] at [facility] Discharge Instructions  You were seen today by Dr. Delton Coombes. He went over your recent PET scan results. He will add Zytiga to your treatment regitine; please take this medicine either 2 hours after you eat dinner, at least one hour before you eat breakfast, or any time in between. [facility] will be filling this prescription and will mail it directly to your house. He discussed your metastatic diagnosis and future treatment options. Dr. Delton Coombes will see you back in 3 weeks for labs and follow up.   Thank you for choosing [place] at [facility] to provide your oncology and hematology care.  To afford each patient quality time with our provider, please arrive at least 15 minutes before your scheduled appointment time.   If you have a lab appointment with the [facility] please come in thru the  Main Entrance and check in at the main information desk  You need to re-schedule your appointment should you arrive 10 or more minutes late.  We strive to give you quality time with our providers, and arriving late affects you and other patients whose appointments are after yours.  Also, if you no show three or more times for appointments you may be dismissed from the clinic at the providers discretion.     Again, thank you for choosing [facility].  Our hope is that these requests will decrease the amount of time that you wait before being seen by our physicians.       _____________________________________________________________  Should you have questions after your visit to [facility], please contact our office at [phone] between the hours of 8:00 a.m. and 4:30 p.m.  Voicemails left after 4:00 p.m. will not be returned until the following business day.  For prescription refill requests, have your pharmacy contact our office and allow 72 hours.    Cancer Center Support Programs:   > Cancer  Support Group  2nd Tuesday of the month 1pm-2pm, Journey Room

## 2019-12-25 NOTE — Telephone Encounter (Signed)
Oral Oncology Patient Advocate Encounter   Received notification from [facility] that prior authorization for Zytiga (Abiraterone) is required.   PA submitted over the phone. Reference # [id number] Status is pending   Oral Oncology Clinic will continue to follow.  [place] Patient [place] Phone [phone] Fax [phone] 12/29/2019 10:16 AM

## 2019-12-26 ENCOUNTER — Telehealth (HOSPITAL_COMMUNITY): Payer: Self-pay | Admitting: Pharmacist

## 2019-12-26 NOTE — Telephone Encounter (Signed)
Oral Oncology Pharmacist Encounter  Received new prescription for Zytiga (abiraterone) for the treatment of metastatic prostate cancer, castrate resistant in conjunction with prednisone, planned duration until disease progression or unacceptable drug toxicity.  Recommend checking a CMP to evaluate liver function. Prescription dose and frequency assessed.   Current medication list in Epic reviewed, one DDIs with abiraterone identified: -Abiraterone may increase the concentration of tamsulosin. Monitor for increased tamsulosin effects (eg, hypotension, orthostasis). No baseline dose adjustments needed.  Prescription has been e-scribed to the [facility] for benefits analysis and approval.  Oral Oncology Clinic will continue to follow for insurance authorization, copayment issues, initial counseling and start date.  Darl Pikes, PharmD, BCPS, BCOP, CPP Hematology/Oncology Clinical Pharmacist Practitioner [facility] [facility] [phone]  12/26/2019 2:24 PM

## 2019-12-26 NOTE — Assessment & Plan Note (Addendum)
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 -External beam radiation with 2 years of Lupron from 05/27/2013 through 07/28/2013.  Received Lupron until 09/28/2014. -Rising PSA levels in December 2018, of 2.8.  Local recurrence was confirmed with biopsy. -Bone scan on 04/12/2018 did not show any evidence of metastatic disease. -He was evaluated by Dr. Iona Beard at [facility].  Bone scan and CT scan at [facility] on 07/02/2018 did not show any evidence of metastatic disease.  Left para-aortic lymph node measures 8 mm.  Lupron started for nonmetastatic HSPC in November 2019 for rapid PSA doubling time of less than 3 months.  He had difficulty tolerating Lupron with irritability, low energy and fatigue. -PSA 5.7 (07/08/2019), PSA 11.1 (11/26/2019) with testosterone 18.  Plan: 1.  Castration resistant prostate cancer to the bones and lymph nodes: -We reviewed results of fluciclovine PET CT scan from 12/23/2019.  There is T11 nauseous meta stasis.  Asymmetric hypermetabolic within the prostate suspicious for residual disease.  Mild enlargement of 2 adjacent lower left para-aortic nodes, largest 8 mm with SUV max of 3.4.  These nodes measured maximally 5 mm on PET scan on 02/25/2018. -We will also consider checking guardant 360/NGS testing in the future. -We discussed normal prognosis of metastatic CRPC.  We have also discussed treatment options including adding abiraterone with prednisone.  We discussed the side effects in detail.  Because of his fatigue and poor tolerability with Lupron, I have suggested him to start taking 500 mg of abiraterone and see how he tolerates.  If he does well we will increase the dose to 1000 mg. -I will see him back in 2 weeks for follow-up with repeat labs.  We will plan to check PSA in 2 months.  2.  Bone metastasis: -We also talked about bone strengthening agents like denosumab.  But we will start it once he is  stable on Zytiga.  3.  Family history: -He thinks his father might have had prostate cancer but was not 100% sure.  We will consider making a referral for genetic testing.  4.  Hypertension: -I have sent a prescription for losartan 25 mg daily as his blood pressure was 162/88.

## 2019-12-29 ENCOUNTER — Telehealth (HOSPITAL_COMMUNITY): Payer: Self-pay | Admitting: Pharmacy Technician

## 2019-12-29 NOTE — Telephone Encounter (Signed)
Oral Oncology Patient Advocate Encounter  Patient has been approved for copay assistance with [employer] (TAF).  [employer] [place] will cover all copayment expenses for Zytiga (Abiraterone) for the remainder of the calendar year.    The billing information is as follows and has been shared with [place].   Member ID: [id number] Group ID: [id number]: [id number] BIN: [id number] Eligibility Dates: 12/29/2019 to 08/06/2020  Fund: [place] Patient [place] Phone [phone] Fax [phone] 12/29/2019 3:15 PM

## 2019-12-29 NOTE — Telephone Encounter (Signed)
Oral Oncology Patient Advocate Encounter  Prior Authorization for Abiraterone has been approved.    PA# [id number] Effective dates: 12/25/19 - until further notice  Patients co-pay is $2168.72  Oral Oncology Clinic will continue to follow.   [place] Patient [place] Phone [phone] Fax [phone] 12/29/2019 2:48 PM

## 2019-12-31 MED FILL — ABIRATERONE ACETATE 250 MG: 250 | 30 days supply | Qty: 120 | Fill #0

## 2019-12-31 MED FILL — predniSONE 5 MG TABS: 5 | 30 days supply | Qty: 30 | Fill #0

## 2019-12-31 NOTE — Telephone Encounter (Signed)
Oral Chemotherapy Pharmacist Encounter  [facility] will ship out the medication to [facility] Smith for delivery on 01/01/20. He knows to get started when he receives the medication.  Patient Education I spoke with patient for overview of new oral chemotherapy medication: Zytiga (abiraterone) for the treatment of metastatic prostate cancer, castrate resistant in conjunction with prednisone, planned duration until disease progression or unacceptable drug toxicity.   Counseled patient on administration, dosing, side effects, monitoring, drug-food interactions, safe handling, storage, and disposal. Patient will take: Zytiga: Take 2 tablets (500mg ) by mouth daily, then if tolerated MD will increase to 4 tablets (1,000 mg total) by mouth daily. Take on an empty stomach 1 hour before or 2 hours after a meal Prednisone: Take 1 tablet (5 mg total) by mouth daily with breakfast  Side effects include but not limited to: HTN, hot flashes, fatigue, decreased wbc.    Reviewed with patient importance of keeping a medication schedule and plan for any missed doses.  Nathan Smith voiced understanding and appreciation. All questions answered. Medication handout placed in the mail.  Provided patient with [facility] phone number. Patient knows to call the office with questions or concerns. Oral Chemotherapy Navigation Clinic will continue to follow.  Darl Pikes, PharmD, BCPS, BCOP, CPP Hematology/Oncology Clinical Pharmacist Practitioner [facility] [phone]  12/31/2019 1:30 PM `

## 2019-12-31 NOTE — Telephone Encounter (Signed)
Oral Oncology Patient Advocate Encounter  I spoke with Mr Nathan Smith this afternoon to set up delivery of Abiraterone + Prednisone.  Address verified for shipment.  Both medications will be filled through [facility] and mailed 12/31/19 for delivery 01/01/20.    [place] will call 7-10 days before next refill is due to complete adherence call and set up delivery of medication.     [place] Patient [place] Phone [phone] Fax [phone] 12/31/2019 2:57 PM

## 2020-01-15 ENCOUNTER — Other Ambulatory Visit: Payer: Self-pay

## 2020-01-15 ENCOUNTER — Inpatient Hospital Stay (HOSPITAL_COMMUNITY): Payer: Medicare Other | Attending: Hematology | Admitting: Hematology

## 2020-01-15 ENCOUNTER — Inpatient Hospital Stay (HOSPITAL_COMMUNITY): Payer: Medicare Other

## 2020-01-15 VITALS — BP 155/81 | HR 65 | Temp 97.1°F | Resp 18 | Wt 153.4 lb

## 2020-01-15 DIAGNOSIS — R5383 Other fatigue: Secondary | ICD-10-CM | POA: Insufficient documentation

## 2020-01-15 DIAGNOSIS — I10 Essential (primary) hypertension: Secondary | ICD-10-CM | POA: Insufficient documentation

## 2020-01-15 DIAGNOSIS — R109 Unspecified abdominal pain: Secondary | ICD-10-CM | POA: Diagnosis not present

## 2020-01-15 DIAGNOSIS — J984 Other disorders of lung: Secondary | ICD-10-CM | POA: Insufficient documentation

## 2020-01-15 DIAGNOSIS — R590 Localized enlarged lymph nodes: Secondary | ICD-10-CM | POA: Insufficient documentation

## 2020-01-15 DIAGNOSIS — Z818 Family history of other mental and behavioral disorders: Secondary | ICD-10-CM | POA: Insufficient documentation

## 2020-01-15 DIAGNOSIS — Z79899 Other long term (current) drug therapy: Secondary | ICD-10-CM | POA: Diagnosis not present

## 2020-01-15 DIAGNOSIS — Z8042 Family history of malignant neoplasm of prostate: Secondary | ICD-10-CM | POA: Insufficient documentation

## 2020-01-15 DIAGNOSIS — C7951 Secondary malignant neoplasm of bone: Secondary | ICD-10-CM | POA: Diagnosis not present

## 2020-01-15 DIAGNOSIS — Z7952 Long term (current) use of systemic steroids: Secondary | ICD-10-CM | POA: Diagnosis not present

## 2020-01-15 DIAGNOSIS — I6522 Occlusion and stenosis of left carotid artery: Secondary | ICD-10-CM | POA: Diagnosis not present

## 2020-01-15 DIAGNOSIS — R519 Headache, unspecified: Secondary | ICD-10-CM | POA: Insufficient documentation

## 2020-01-15 DIAGNOSIS — C61 Malignant neoplasm of prostate: Secondary | ICD-10-CM | POA: Insufficient documentation

## 2020-01-15 DIAGNOSIS — R42 Dizziness and giddiness: Secondary | ICD-10-CM | POA: Insufficient documentation

## 2020-01-15 DIAGNOSIS — Z923 Personal history of irradiation: Secondary | ICD-10-CM | POA: Diagnosis not present

## 2020-01-15 DIAGNOSIS — I700 Atherosclerosis of aorta: Secondary | ICD-10-CM | POA: Insufficient documentation

## 2020-01-15 LAB — COMPREHENSIVE METABOLIC PANEL
ALT: 19 U/L (ref 0–44)
AST: 23 U/L (ref 15–41)
Albumin: 4.1 g/dL (ref 3.5–5.0)
Alkaline Phosphatase: 66 U/L (ref 38–126)
Anion gap: 9 (ref 5–15)
BUN: 12 mg/dL (ref 8–23)
CO2: 31 mmol/L (ref 22–32)
Calcium: 9.5 mg/dL (ref 8.9–10.3)
Chloride: 103 mmol/L (ref 98–111)
Creatinine, Ser: 1.11 mg/dL (ref 0.61–1.24)
GFR calc Af Amer: >60 mL/min (ref >60)
GFR calc non Af Amer: >60 mL/min (ref >60)
Glucose, Bld: 70 mg/dL (ref 70–99)
Potassium: 3.4 mmol/L — ABNORMAL LOW (ref 3.5–5.1)
Sodium: 143 mmol/L (ref 135–145)
Total Bilirubin: 1.1 mg/dL (ref 0.3–1.2)
Total Protein: 6.9 g/dL (ref 6.5–8.1)

## 2020-01-15 LAB — CBC WITH DIFFERENTIAL/PLATELET
Abs Immature Granulocytes: 0 10*3/uL (ref 0.00–0.07)
Basophils Absolute: 0.1 10*3/uL (ref 0.0–0.1)
Basophils Relative: 1 %
Eosinophils Absolute: 0.1 10*3/uL (ref 0.0–0.5)
Eosinophils Relative: 3 %
HCT: 46.9 % (ref 39.0–52.0)
Hemoglobin: 14.8 g/dL (ref 13.0–17.0)
Immature Granulocytes: 0 %
Lymphocytes Relative: 17 %
Lymphs Abs: 0.8 10*3/uL (ref 0.7–4.0)
MCH: 32 pg (ref 26.0–34.0)
MCHC: 31.6 g/dL (ref 30.0–36.0)
MCV: 101.5 fL — ABNORMAL HIGH (ref 80.0–100.0)
Monocytes Absolute: 0.5 10*3/uL (ref 0.1–1.0)
Monocytes Relative: 11 %
Neutro Abs: 3.1 10*3/uL (ref 1.7–7.7)
Neutrophils Relative %: 68 %
Platelets: 244 10*3/uL (ref 150–400)
RBC: 4.62 MIL/uL (ref 4.22–5.81)
RDW: 13.8 % (ref 11.5–15.5)
WBC: 4.5 10*3/uL (ref 4.0–10.5)
nRBC: 0 % (ref 0.0–0.2)

## 2020-01-15 LAB — PSA: Prostatic Specific Antigen: 8.25 ng/mL — ABNORMAL HIGH (ref 0.00–4.00)
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
## 2020-01-15 NOTE — Patient Instructions (Signed)
[place] at [facility] Discharge Instructions  You were seen today by Dr. Delton Coombes. He went over your recent results. Please take 1/4 tablet of prednisone every day, and if you tolerate it well, take 1/3 tablet daily. Do not take losartan while you are taking prednisone. Continue taking 500 mg of Zytiga daily. Monitor your blood pressure at home. Please continue your usual care with your primary care provider. Dr. Delton Coombes will see you back in 4 weeks for labs and follow up.   Thank you for choosing [place] at [facility] to provide your oncology and hematology care.  To afford each patient quality time with our provider, please arrive at least 15 minutes before your scheduled appointment time.   If you have a lab appointment with the [facility] please come in thru the Main Entrance and check in at the main information desk  You need to re-schedule your appointment should you arrive 10 or more minutes late.  We strive to give you quality time with our providers, and arriving late affects you and other patients whose appointments are after yours.  Also, if you no show three or more times for appointments you may be dismissed from the clinic at the providers discretion.     Again, thank you for choosing [facility].  Our hope is that these requests will decrease the amount of time that you wait before being seen by our physicians.       _____________________________________________________________  Should you have questions after your visit to [facility], please contact our office at [phone] between the hours of 8:00 a.m. and 4:30 p.m.  Voicemails left after 4:00 p.m. will not be returned until the following business day.  For prescription refill requests, have your pharmacy contact our office and allow 72 hours.    Cancer Center Support Programs:   > Cancer Support Group  2nd Tuesday of the month 1pm-2pm,  Journey Room

## 2020-01-22 ENCOUNTER — Other Ambulatory Visit (HOSPITAL_COMMUNITY): Payer: Self-pay | Admitting: *Deleted

## 2020-01-22 MED ORDER — LOSARTAN POTASSIUM 25 MG PO TABS: 25.0000 mg | ORAL_TABLET | Freq: Every day | ORAL | 3 refills | Status: DC

## 2020-01-26 ENCOUNTER — Other Ambulatory Visit (HOSPITAL_COMMUNITY): Payer: Self-pay | Admitting: *Deleted

## 2020-01-26 MED ORDER — ABIRATERONE ACETATE 250 MG PO TABS: 1000.0000 mg | ORAL_TABLET | Freq: Every day | ORAL | 2 refills | Status: DC

## 2020-01-27 MED FILL — predniSONE 5 MG TABS: 5 | 30 days supply | Qty: 30 | Fill #1

## 2020-01-27 MED FILL — ABIRATERONE ACETATE 250 MG: 250 | 30 days supply | Qty: 120 | Fill #1

## 2020-02-03 ENCOUNTER — Ambulatory Visit: Payer: Medicare Other | Admitting: Urology

## 2020-02-13 ENCOUNTER — Inpatient Hospital Stay (HOSPITAL_COMMUNITY): Payer: Medicare Other | Attending: Hematology

## 2020-02-13 ENCOUNTER — Other Ambulatory Visit: Payer: Self-pay

## 2020-02-13 DIAGNOSIS — R634 Abnormal weight loss: Secondary | ICD-10-CM | POA: Insufficient documentation

## 2020-02-13 DIAGNOSIS — C7951 Secondary malignant neoplasm of bone: Secondary | ICD-10-CM | POA: Insufficient documentation

## 2020-02-13 DIAGNOSIS — I10 Essential (primary) hypertension: Secondary | ICD-10-CM | POA: Insufficient documentation

## 2020-02-13 DIAGNOSIS — Z8042 Family history of malignant neoplasm of prostate: Secondary | ICD-10-CM | POA: Diagnosis not present

## 2020-02-13 DIAGNOSIS — Z79899 Other long term (current) drug therapy: Secondary | ICD-10-CM | POA: Insufficient documentation

## 2020-02-13 DIAGNOSIS — Z923 Personal history of irradiation: Secondary | ICD-10-CM | POA: Insufficient documentation

## 2020-02-13 DIAGNOSIS — C61 Malignant neoplasm of prostate: Secondary | ICD-10-CM | POA: Diagnosis present

## 2020-02-13 LAB — CBC WITH DIFFERENTIAL/PLATELET
Abs Immature Granulocytes: 0.01 10*3/uL (ref 0.00–0.07)
Basophils Absolute: 0.1 10*3/uL (ref 0.0–0.1)
Basophils Relative: 1 %
Eosinophils Absolute: 0.2 10*3/uL (ref 0.0–0.5)
Eosinophils Relative: 4 %
HCT: 45.3 % (ref 39.0–52.0)
Hemoglobin: 15 g/dL (ref 13.0–17.0)
Immature Granulocytes: 0 %
Lymphocytes Relative: 11 %
Lymphs Abs: 0.7 10*3/uL (ref 0.7–4.0)
MCH: 33.3 pg (ref 26.0–34.0)
MCHC: 33.1 g/dL (ref 30.0–36.0)
MCV: 100.4 fL — ABNORMAL HIGH (ref 80.0–100.0)
Monocytes Absolute: 0.6 10*3/uL (ref 0.1–1.0)
Monocytes Relative: 9 %
Neutro Abs: 4.5 10*3/uL (ref 1.7–7.7)
Neutrophils Relative %: 75 %
Platelets: 235 10*3/uL (ref 150–400)
RBC: 4.51 MIL/uL (ref 4.22–5.81)
RDW: 13.5 % (ref 11.5–15.5)
WBC: 6 10*3/uL (ref 4.0–10.5)
nRBC: 0 % (ref 0.0–0.2)

## 2020-02-13 LAB — PSA: Prostatic Specific Antigen: 3.15 ng/mL (ref 0.00–4.00)

## 2020-02-13 LAB — COMPREHENSIVE METABOLIC PANEL
ALT: 235 U/L — ABNORMAL HIGH (ref 0–44)
AST: 167 U/L — ABNORMAL HIGH (ref 15–41)
Albumin: 4.1 g/dL (ref 3.5–5.0)
Alkaline Phosphatase: 80 U/L (ref 38–126)
Anion gap: 9 (ref 5–15)
BUN: 17 mg/dL (ref 8–23)
CO2: 30 mmol/L (ref 22–32)
Calcium: 9.3 mg/dL (ref 8.9–10.3)
Chloride: 100 mmol/L (ref 98–111)
Creatinine, Ser: 1 mg/dL (ref 0.61–1.24)
GFR calc Af Amer: >60 mL/min (ref >60)
GFR calc non Af Amer: >60 mL/min (ref >60)
Glucose, Bld: 127 mg/dL — ABNORMAL HIGH (ref 70–99)
Potassium: 4 mmol/L (ref 3.5–5.1)
Sodium: 139 mmol/L (ref 135–145)
Total Bilirubin: 1.2 mg/dL (ref 0.3–1.2)
Total Protein: 6.9 g/dL (ref 6.5–8.1)

## 2020-02-16 ENCOUNTER — Ambulatory Visit (HOSPITAL_COMMUNITY): Payer: Medicare Other | Admitting: Hematology

## 2020-02-16 ENCOUNTER — Other Ambulatory Visit (HOSPITAL_COMMUNITY): Payer: Medicare Other

## 2020-02-16 ENCOUNTER — Other Ambulatory Visit: Payer: Self-pay

## 2020-02-16 ENCOUNTER — Inpatient Hospital Stay (HOSPITAL_BASED_OUTPATIENT_CLINIC_OR_DEPARTMENT_OTHER): Payer: Medicare Other | Admitting: Hematology

## 2020-02-16 VITALS — BP 143/93 | HR 84 | Temp 97.8°F | Resp 18 | Wt 148.3 lb

## 2020-02-16 DIAGNOSIS — C61 Malignant neoplasm of prostate: Secondary | ICD-10-CM | POA: Diagnosis not present

## 2020-02-16 NOTE — Patient Instructions (Signed)
[place] at [facility] Discharge Instructions  You were seen today by Dr. Delton Coombes. He went over your recent results. Stop taking Zytiga due to your liver damage. Stop taking prednisone until the next office visit. Start taking the losartan daily if your blood pressure is elevated at home. Dr. Delton Coombes will see you back in 2 weeks for labs and follow up.   Thank you for choosing [place] at [facility] to provide your oncology and hematology care.  To afford each patient quality time with our provider, please arrive at least 15 minutes before your scheduled appointment time.   If you have a lab appointment with the [facility] please come in thru the Main Entrance and check in at the main information desk  You need to re-schedule your appointment should you arrive 10 or more minutes late.  We strive to give you quality time with our providers, and arriving late affects you and other patients whose appointments are after yours.  Also, if you no show three or more times for appointments you may be dismissed from the clinic at the providers discretion.     Again, thank you for choosing [facility].  Our hope is that these requests will decrease the amount of time that you wait before being seen by our physicians.       _____________________________________________________________  Should you have questions after your visit to [facility], please contact our office at [phone] between the hours of 8:00 a.m. and 4:30 p.m.  Voicemails left after 4:00 p.m. will not be returned until the following business day.  For prescription refill requests, have your pharmacy contact our office and allow 72 hours.    Cancer Center Support Programs:   > Cancer Support Group  2nd Tuesday of the month 1pm-2pm, Journey Room
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
## 2020-02-17 ENCOUNTER — Ambulatory Visit (INDEPENDENT_AMBULATORY_CARE_PROVIDER_SITE_OTHER): Payer: Medicare Other | Admitting: Urology

## 2020-02-17 ENCOUNTER — Encounter: Payer: Self-pay | Admitting: Urology

## 2020-02-17 VITALS — BP 154/84 | HR 59 | Temp 97.6°F | Ht 74.0 in | Wt 148.0 lb

## 2020-02-17 DIAGNOSIS — C61 Malignant neoplasm of prostate: Secondary | ICD-10-CM | POA: Diagnosis not present

## 2020-02-17 LAB — POCT URINALYSIS DIPSTICK
Bilirubin, UA: NEGATIVE
Blood, UA: NEGATIVE
Glucose, UA: NEGATIVE
Ketones, UA: NEGATIVE
Leukocytes, UA: NEGATIVE
Nitrite, UA: NEGATIVE
Protein, UA: NEGATIVE
Spec Grav, UA: <=1.005 — AB (ref 1.010–1.025)
Urobilinogen, UA: 0.2 E.U./dL
pH, UA: 6.5 (ref 5.0–8.0)

## 2020-02-17 MED ORDER — LEUPROLIDE ACETATE (6 MONTH) 45 MG IM KIT
45.0000 mg | PACK | Freq: Once | INTRAMUSCULAR | Status: AC
  Administered 2020-02-17: 45 mg via INTRAMUSCULAR

## 2020-02-17 NOTE — Progress Notes (Signed)
H&P  Chief Complaint: Prostate cancer  History of Present Illness: Nathan Smith comes in today for follow-up of his prostate cancer.  He is now on Zytiga and prednisone.  He has had significant decrease in his PSA from 11-8 now down to about 3.  Unfortunately, he is having significant side effects from the prednisone.  Also, his LFTs elevated with institution of Zytiga.  His prednisone is bothering him personality wise.  He is having suicidal ideations.  He is optimistic about his PSA decreased, however.  He is here today for a 1-month leuprolide injection.  Dr. Delton Coombes is caring for his second line therapy i.e. Zytiga/prednisone.  Relevant past history is as follows:  Prostate Cancer:  Nathan Smith is a 69 year-old male established patient who is here evaluation for treatment of prostate cancer.  His prostate cancer was diagnosed approximately 03/07/2013. He does have the pathology report from his biopsy. His cancer was diagnosed by Natalyia Innes. His PSA at his time of diagnosis was 34. His most recent PSA is 5.7.   He has undergone External Beam Radiation Therapy for treatment. He has undergone Hormonal Therapy for treatment.   Initially underwent TRUS/Bx 8.1.2014. All biopsies revealed adenocarcinoma, GS 4+3 throughout the prostate. Right seminal vesicle revealed a GS 4+4, left revealed a GS 4+3. He underwent radiographic imaging including a bone scan and CT of the abdomen and pelvis. CT failed to reveal any significant adenopathy or evidence of extra prostatic extension. It was recommended that he get 2 years of androgen deprivation. He received his first [place] on 5.13.2014. He received his 1st 6 month Lupron on 6.13.2014. Because his testosterone was not quite castrate, he was switched from Lupron to [employer]. He received a 6 month injection of that in mid -December, 2014. His last injection was on 2.22.2016.   12.11.2018: Elevating trend was PSA, most recently 2.8. He has stable urinary  symptomatology. He has been extremely anxious of undergoing a repeat biopsy.   10.6.2020: He returns today for follow-up -- he has been being seen by [facility] for his prostate cancer. He was initiated onADT. Most recent PSA was 5.2. Most recent CT and bone scan apparently showed no signs of metastatic disease (2019) -- he has had no repeat imaging. He is not very happy on lupron -- he is having hot flashes 2x a day. He is due his next 6 mo lupron this November.   12.8.2020: PSA 5.7 on 12.1.2020. He reports having some okay days and some awful days with irritability, low energy, and fatigue -- he attributes this to lupron. Testosterone (12.1) measured 29. He has been having surveillance imaging at [facility]. He is due 6 mo lupron today.   4.27.2021: Most recent PSA 11.1, T level 18. Stable symptomatology. He denies any new bony aches/pain though he does c/o some abdominal aching (he thinks this is mostly dietary). Weight remains stable. He denies any blood per urine or stool. Stable bowel pattern.  BPH:  He is now off of medical therapy for BPH. He denies significant LUTS.  Past Medical History:  Diagnosis Date  . Anxiety   . Bilateral hydronephrosis   . Bladder incontinence    night time,  past 2 months  . BPH (benign prostatic hypertrophy) with urinary obstruction   . Depression   . ED (erectile dysfunction)   . H/O ascites    trace  . Hypertension   . Prostate cancer ([place]) 09/24/12   gleason 4+3=7., & 4+4=8,PSA=67.30, volume=33.7cc    Past Surgical History:  Procedure Laterality Date  . PROSTATE BIOPSY  09/24/12   Adenocarcinoma  . PROSTATE BIOPSY N/A 12/20/2017   Procedure: BIOPSY TRANSRECTAL ULTRASONIC PROSTATE (TUBP);  Surgeon: Franchot Gallo, MD;  Location: [facility];  Service: Urology;  Laterality: N/A;  . TONSILLECTOMY      Home Medications:  Allergies as of 02/17/2020   No Known Allergies     Medication List       Accurate as of February 17, 2020  5:07 PM. If you have any  questions, ask your nurse or doctor.        abiraterone acetate 250 MG tablet Commonly known as: ZYTIGA Take 4 tablets (1,000 mg total) by mouth daily. Take on an empty stomach 1 hour before or 2 hours after a meal   Align 4 MG Caps Take 1 capsule by mouth daily.   b complex vitamins capsule Take 1 capsule by mouth daily.   Cholecalciferol 125 MCG (5000 UT) capsule Take 5,000 Units by mouth daily.   Curcumin 95 500 MG Caps Generic drug: Turmeric Take 1 capsule by mouth daily.   Ginseng 100 MG Caps Take by mouth daily.   glucosamine-chondroitin 500-400 MG tablet Take 1 tablet by mouth daily.   L-ARGININE PO Take 1,000 mg by mouth daily.   losartan 25 MG tablet Commonly known as: COZAAR Take 1 tablet (25 mg total) by mouth daily.   melatonin 1 MG Tabs tablet Take 2 mg by mouth at bedtime.   predniSONE 5 MG tablet Commonly known as: DELTASONE Take 1 tablet (5 mg total) by mouth daily with breakfast.   Probiotic 1-250 BILLION-MG Caps Take by mouth daily.   tamsulosin 0.4 MG Caps capsule Commonly known as: FLOMAX Take 1 capsule (0.4 mg total) by mouth daily.   vitamin C 1000 MG tablet Take 1,000 mg by mouth daily.   zinc gluconate 50 MG tablet Take 50 mg by mouth daily.       Allergies: No Known Allergies  Family History  Problem Relation Age of Onset  . Depression Mother   . Dementia Mother   . Anxiety disorder Mother   . Cancer Father        prostate  . Cancer Paternal Grandfather        prostate    Social History:  reports that he has never smoked. He has never used smokeless tobacco. He reports current alcohol use of about 2.0 standard drinks of alcohol per week. He reports that he does not use drugs.  ROS: A complete review of systems was performed.  All systems are negative except for pertinent findings as noted.  Physical Exam:  Vital signs in last 24 hours: BP (!) 154/84   Pulse (!) 59   Temp 97.6 F (36.4 C)   Ht 6\' 2"  (1.88 m)    Wt 148 lb (67.1 kg)   BMI 19.00 kg/m  Constitutional:  Alert and oriented, No acute distress Cardiovascular: Regular rate  Respiratory: Normal respiratory effortn. Neurologic: Grossly intact, no focal deficits Psychiatric: Normal mood and affect  Laboratory Data:  No results for input(s): WBC, HGB, HCT, PLT in the last 72 hours.  No results for input(s): NA, K, CL, GLUCOSE, BUN, CALCIUM, CREATININE in the last 72 hours.  Invalid input(s): CO3   Results for orders placed or performed in visit on 02/17/20 (from the past 24 hour(s))  POCT urinalysis dipstick     Status: Abnormal   Collection Time: 02/17/20  3:13 PM  Result Value Ref Range  Color, UA yellow    Clarity, UA     Glucose, UA Negative Negative   Bilirubin, UA neg    Ketones, UA neg    Spec Grav, UA <=1.005 (A) 1.010 - 1.025   Blood, UA neg    pH, UA 6.5 5.0 - 8.0   Protein, UA Negative Negative   Urobilinogen, UA 0.2 0.2 or 1.0 E.U./dL   Nitrite, UA neg    Leukocytes, UA Negative Negative   Appearance clear    Odor     I have reviewed prior pt notes  I have reviewed notes from referring/previous physicians  I have reviewed urinalysis results  I have independently reviewed prior imaging  I have reviewed prior PSA/T results  Impression/Assessment:  1.  Prostate cancer, recurrent despite initial curative therapy with ADT/EBRT.  He has had excellent PSA response to Zytiga, but does have mental status changes as well as hepatotoxicity.  He is dealing with a decreased dose of Zytiga currently.  2.  BPH, improved with current therapy, minimal symptoms  Plan:  1.  I would suggest that we consider tapering his Zytiga and prednisone, and perhaps instituting another second line therapy i.e. Xtandi.  I have sent a message to Dr. Delton Coombes about this  2.  He was given a 44-month leuprolide injection today  3.  I will have him come in every couple of months for close follow-up/support

## 2020-02-17 NOTE — Progress Notes (Signed)
Urological Symptom Review  Patient is experiencing the following symptoms: Get up at night to urinate   Review of Systems  Gastrointestinal (upper)  : Negative for upper GI symptoms  Gastrointestinal (lower) : Negative for lower GI symptoms  Constitutional : Night Sweats  Skin: Negative for skin symptoms  Eyes: Negative for eye symptoms  Ear/Nose/Throat : Negative for Ear/Nose/Throat symptoms  Hematologic/Lymphatic: Negative for Hematologic/Lymphatic symptoms  Cardiovascular : Negative for cardiovascular symptoms  Respiratory : Negative for respiratory symptoms  Endocrine: Negative for endocrine symptoms  Musculoskeletal: Negative for musculoskeletal symptoms  Neurological: Headaches  Psychologic: Depression Anxiety

## 2020-02-19 NOTE — Addendum Note (Signed)
Addended by: Farley Ly on: 02/19/2020 04:29 PM   Modules accepted: Orders

## 2020-03-01 ENCOUNTER — Inpatient Hospital Stay (HOSPITAL_COMMUNITY): Payer: Medicare Other

## 2020-03-01 ENCOUNTER — Other Ambulatory Visit: Payer: Self-pay

## 2020-03-01 DIAGNOSIS — C61 Malignant neoplasm of prostate: Secondary | ICD-10-CM | POA: Diagnosis not present

## 2020-03-01 LAB — COMPREHENSIVE METABOLIC PANEL
ALT: 31 U/L (ref 0–44)
AST: 24 U/L (ref 15–41)
Albumin: 4.2 g/dL (ref 3.5–5.0)
Alkaline Phosphatase: 64 U/L (ref 38–126)
Anion gap: 9 (ref 5–15)
BUN: 15 mg/dL (ref 8–23)
CO2: 29 mmol/L (ref 22–32)
Calcium: 9.2 mg/dL (ref 8.9–10.3)
Chloride: 100 mmol/L (ref 98–111)
Creatinine, Ser: 0.97 mg/dL (ref 0.61–1.24)
GFR calc Af Amer: >60 mL/min (ref >60)
GFR calc non Af Amer: >60 mL/min (ref >60)
Glucose, Bld: 98 mg/dL (ref 70–99)
Potassium: 4.1 mmol/L (ref 3.5–5.1)
Sodium: 138 mmol/L (ref 135–145)
Total Bilirubin: 0.9 mg/dL (ref 0.3–1.2)
Total Protein: 6.9 g/dL (ref 6.5–8.1)

## 2020-03-02 ENCOUNTER — Inpatient Hospital Stay (HOSPITAL_BASED_OUTPATIENT_CLINIC_OR_DEPARTMENT_OTHER): Payer: Medicare Other | Admitting: Hematology

## 2020-03-02 VITALS — BP 137/80 | HR 62 | Temp 97.3°F | Resp 18 | Wt 150.1 lb

## 2020-03-02 DIAGNOSIS — I10 Essential (primary) hypertension: Secondary | ICD-10-CM | POA: Diagnosis not present

## 2020-03-02 DIAGNOSIS — Z8042 Family history of malignant neoplasm of prostate: Secondary | ICD-10-CM

## 2020-03-02 DIAGNOSIS — C61 Malignant neoplasm of prostate: Secondary | ICD-10-CM | POA: Diagnosis not present

## 2020-03-02 DIAGNOSIS — Z7951 Long term (current) use of inhaled steroids: Secondary | ICD-10-CM

## 2020-03-02 DIAGNOSIS — R634 Abnormal weight loss: Secondary | ICD-10-CM

## 2020-03-02 DIAGNOSIS — Z79899 Other long term (current) drug therapy: Secondary | ICD-10-CM

## 2020-03-02 DIAGNOSIS — Z923 Personal history of irradiation: Secondary | ICD-10-CM

## 2020-03-02 MED ORDER — TAMSULOSIN HCL 0.4 MG PO CAPS: 0.4000 mg | ORAL_CAPSULE | Freq: Every day | ORAL | 3 refills | Status: DC

## 2020-03-02 MED ORDER — ENZALUTAMIDE 40 MG PO CAPS: 160.0000 mg | ORAL_CAPSULE | Freq: Every day | ORAL | 0 refills | Status: DC

## 2020-03-02 NOTE — Patient Instructions (Addendum)
[place] at [facility] Discharge Instructions  You were seen today by Dr. Delton Coombes. He went over your recent results. We will change your treatment regimen and move you off Zytiga/Prednisone. You will be placed on Xtandi, which you will take 2 pills per day. Dr. Delton Coombes will see you back in for labs and follow up.    Thank you for choosing [place] at [facility] to provide your oncology and hematology care.  To afford each patient quality time with our provider, please arrive at least 15 minutes before your scheduled appointment time.   If you have a lab appointment with the [facility] please come in thru the Main Entrance and check in at the main information desk  You need to re-schedule your appointment should you arrive 10 or more minutes late.  We strive to give you quality time with our providers, and arriving late affects you and other patients whose appointments are after yours.  Also, if you no show three or more times for appointments you may be dismissed from the clinic at the providers discretion.     Again, thank you for choosing [facility].  Our hope is that these requests will decrease the amount of time that you wait before being seen by our physicians.       _____________________________________________________________  Should you have questions after your visit to [facility], please contact our office at [phone] between the hours of 8:00 a.m. and 4:30 p.m.  Voicemails left after 4:00 p.m. will not be returned until the following business day.  For prescription refill requests, have your pharmacy contact our office and allow 72 hours.    Cancer Center Support Programs:   > Cancer Support Group  2nd Tuesday of the month 1pm-2pm, Journey Room
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
## 2020-03-03 ENCOUNTER — Telehealth (HOSPITAL_COMMUNITY): Payer: Self-pay | Admitting: Pharmacy Technician

## 2020-03-03 ENCOUNTER — Telehealth (HOSPITAL_COMMUNITY): Payer: Self-pay | Admitting: Pharmacist

## 2020-03-03 NOTE — Telephone Encounter (Signed)
Oral Oncology Pharmacist Encounter  Received new prescription for Xtandi (enzalutamide) for the treatment of stage IV CRPC, planned duration until disease progression or unacceptable drug toxicity.  Prescription dose and frequency assessed. MD plans to start him on 80mg  then increase as tolerated.  Current medication list in Epic reviewed, one DDIs with Gillermina Phy identified: - Xtandi may decrease the serum concentration of losartan. Monitor patient for decreased effectiveness of losartan. No baseline dose adjustment needed.   Prescription has been e-scribed to the [facility] for benefits analysis and approval.  Oral Oncology Clinic will continue to follow for insurance authorization, copayment issues, initial counseling and start date.  Darl Pikes, PharmD, BCPS, BCOP, CPP Hematology/Oncology Clinical Pharmacist Practitioner [facility] [phone]  03/03/2020 12:04 PM

## 2020-03-03 NOTE — Telephone Encounter (Signed)
Oral Oncology Patient Advocate Encounter  Received notification from [facility] that prior authorization for Gillermina Phy is required.  PA submitted on CoverMyMeds Key [id number]  Status is pending  Oral Oncology Clinic will continue to follow.  [place] Patient [place] Phone [phone] Fax [phone] 03/03/2020 10:50 AM

## 2020-03-03 NOTE — Telephone Encounter (Signed)
Oral Oncology Patient Advocate Encounter  Prior Authorization for Nathan Smith has been approved.    PA# [id number] Effective dates: 12/04/19 - until further notice  Patients co-pay is $1206.13.  Patient has an active grant through [employer] that will cover copay until 08/06/20.  Oral Oncology Clinic will continue to follow.   [place] Patient [place] Phone [phone] Fax [phone] 03/03/2020 11:45 AM

## 2020-03-08 MED FILL — XTANDI 40 MG CAPSULE: 40 | 30 days supply | Qty: 120 | Fill #0

## 2020-03-08 NOTE — Telephone Encounter (Signed)
Oral Chemotherapy Pharmacist Encounter  Patient Education I spoke with patient for overview of new oral chemotherapy medication: Xtandi (enzalutamide) for the treatment of stage IV CRPC, planned duration until disease progression or unacceptable drug toxicity.   Pt is doing well. Counseled patient on administration, dosing, side effects, monitoring, drug-food interactions, safe handling, storage, and disposal. Patient will take 2 capsules (80 mg total) by mouth daily. The plan ti in crease the dose as tolerated.  Side effects include but not limited to: fatigue, low risk of seizure, hot flashes.    Reviewed with patient importance of keeping a medication schedule and plan for any missed doses.  Nathan Smith voiced understanding and appreciation. All questions answered. Medication handout placed in the mail.  Provided patient with [facility] phone number. Patient knows to call the office with questions or concerns. Oral Chemotherapy Navigation Clinic will continue to follow.  Darl Pikes, PharmD, BCPS, BCOP, CPP Hematology/Oncology Clinical Pharmacist Practitioner [facility] [phone]  03/08/2020 2:09 PM

## 2020-03-18 ENCOUNTER — Other Ambulatory Visit (HOSPITAL_COMMUNITY): Payer: Self-pay

## 2020-03-18 DIAGNOSIS — C61 Malignant neoplasm of prostate: Secondary | ICD-10-CM

## 2020-03-18 NOTE — Progress Notes (Signed)
Orders placed for testosterone level to be checked at next lab visit. Orders linked to appt as appropriate.

## 2020-03-22 ENCOUNTER — Inpatient Hospital Stay (HOSPITAL_COMMUNITY): Payer: Medicare Other | Attending: Hematology

## 2020-03-22 ENCOUNTER — Other Ambulatory Visit: Payer: Self-pay

## 2020-03-22 DIAGNOSIS — C779 Secondary and unspecified malignant neoplasm of lymph node, unspecified: Secondary | ICD-10-CM | POA: Insufficient documentation

## 2020-03-22 DIAGNOSIS — C61 Malignant neoplasm of prostate: Secondary | ICD-10-CM | POA: Diagnosis present

## 2020-03-22 DIAGNOSIS — Z79899 Other long term (current) drug therapy: Secondary | ICD-10-CM | POA: Insufficient documentation

## 2020-03-22 DIAGNOSIS — R634 Abnormal weight loss: Secondary | ICD-10-CM | POA: Insufficient documentation

## 2020-03-22 DIAGNOSIS — C7951 Secondary malignant neoplasm of bone: Secondary | ICD-10-CM | POA: Diagnosis not present

## 2020-03-22 DIAGNOSIS — I10 Essential (primary) hypertension: Secondary | ICD-10-CM | POA: Diagnosis not present

## 2020-03-22 DIAGNOSIS — Z8042 Family history of malignant neoplasm of prostate: Secondary | ICD-10-CM | POA: Diagnosis not present

## 2020-03-22 LAB — CBC WITH DIFFERENTIAL/PLATELET
Abs Immature Granulocytes: 0 10*3/uL (ref 0.00–0.07)
Basophils Absolute: 0 10*3/uL (ref 0.0–0.1)
Basophils Relative: 1 %
Eosinophils Absolute: 0.1 10*3/uL (ref 0.0–0.5)
Eosinophils Relative: 2 %
HCT: 45.9 % (ref 39.0–52.0)
Hemoglobin: 14.9 g/dL (ref 13.0–17.0)
Immature Granulocytes: 0 %
Lymphocytes Relative: 18 %
Lymphs Abs: 0.8 10*3/uL (ref 0.7–4.0)
MCH: 32.5 pg (ref 26.0–34.0)
MCHC: 32.5 g/dL (ref 30.0–36.0)
MCV: 100.2 fL — ABNORMAL HIGH (ref 80.0–100.0)
Monocytes Absolute: 0.5 10*3/uL (ref 0.1–1.0)
Monocytes Relative: 12 %
Neutro Abs: 2.8 10*3/uL (ref 1.7–7.7)
Neutrophils Relative %: 67 %
Platelets: 238 10*3/uL (ref 150–400)
RBC: 4.58 MIL/uL (ref 4.22–5.81)
RDW: 13.6 % (ref 11.5–15.5)
WBC: 4.2 10*3/uL (ref 4.0–10.5)
nRBC: 0 % (ref 0.0–0.2)

## 2020-03-22 LAB — COMPREHENSIVE METABOLIC PANEL
ALT: 17 U/L (ref 0–44)
AST: 22 U/L (ref 15–41)
Albumin: 4 g/dL (ref 3.5–5.0)
Alkaline Phosphatase: 53 U/L (ref 38–126)
Anion gap: 8 (ref 5–15)
BUN: 14 mg/dL (ref 8–23)
CO2: 29 mmol/L (ref 22–32)
Calcium: 9.4 mg/dL (ref 8.9–10.3)
Chloride: 102 mmol/L (ref 98–111)
Creatinine, Ser: 0.95 mg/dL (ref 0.61–1.24)
GFR calc Af Amer: >60 mL/min (ref >60)
GFR calc non Af Amer: >60 mL/min (ref >60)
Glucose, Bld: 114 mg/dL — ABNORMAL HIGH (ref 70–99)
Potassium: 4.7 mmol/L (ref 3.5–5.1)
Sodium: 139 mmol/L (ref 135–145)
Total Bilirubin: 0.7 mg/dL (ref 0.3–1.2)
Total Protein: 6.6 g/dL (ref 6.5–8.1)

## 2020-03-22 LAB — LACTATE DEHYDROGENASE: LDH: 129 U/L (ref 98–192)

## 2020-03-22 LAB — MAGNESIUM: Magnesium: 2.2 mg/dL (ref 1.7–2.4)

## 2020-03-22 LAB — PSA: Prostatic Specific Antigen: 2.39 ng/mL (ref 0.00–4.00)

## 2020-03-23 LAB — TESTOSTERONE: Testosterone: <3 ng/dL — ABNORMAL LOW (ref 264–916)

## 2020-03-24 ENCOUNTER — Ambulatory Visit (HOSPITAL_COMMUNITY): Payer: Medicare Other | Admitting: Hematology

## 2020-03-29 ENCOUNTER — Inpatient Hospital Stay (HOSPITAL_BASED_OUTPATIENT_CLINIC_OR_DEPARTMENT_OTHER): Payer: Medicare Other | Admitting: Hematology

## 2020-03-29 ENCOUNTER — Other Ambulatory Visit: Payer: Self-pay

## 2020-03-29 VITALS — BP 146/77 | HR 62 | Temp 96.9°F | Resp 18 | Wt 149.5 lb

## 2020-03-29 DIAGNOSIS — C61 Malignant neoplasm of prostate: Secondary | ICD-10-CM | POA: Diagnosis not present

## 2020-03-29 MED ORDER — METHYLPHENIDATE HCL 5 MG PO TABS
2.5000 mg | ORAL_TABLET | Freq: Every day | ORAL | 0 refills | Status: DC | PRN
Start: 2020-03-29 — End: 2021-07-11

## 2020-03-29 NOTE — Patient Instructions (Addendum)
[place] at [facility] Discharge Instructions  You were seen today by Dr. Delton Coombes. He went over your recent results. Keep taking the Xtandi 2 capsules daily. You will be prescribed Ritalin 5 mg to focus on your performances. Dr. Delton Coombes will see you back in 3 weeks for labs and follow up.   Thank you for choosing [place] at [facility] to provide your oncology and hematology care.  To afford each patient quality time with our provider, please arrive at least 15 minutes before your scheduled appointment time.   If you have a lab appointment with the [facility] please come in thru the Main Entrance and check in at the main information desk  You need to re-schedule your appointment should you arrive 10 or more minutes late.  We strive to give you quality time with our providers, and arriving late affects you and other patients whose appointments are after yours.  Also, if you no show three or more times for appointments you may be dismissed from the clinic at the providers discretion.     Again, thank you for choosing [facility].  Our hope is that these requests will decrease the amount of time that you wait before being seen by our physicians.       _____________________________________________________________  Should you have questions after your visit to [facility], please contact our office at [phone] between the hours of 8:00 a.m. and 4:30 p.m.  Voicemails left after 4:00 p.m. will not be returned until the following business day.  For prescription refill requests, have your pharmacy contact our office and allow 72 hours.    Cancer Center Support Programs:   > Cancer Support Group  2nd Tuesday of the month 1pm-2pm, Journey Room
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
## 2020-04-01 ENCOUNTER — Other Ambulatory Visit (HOSPITAL_COMMUNITY): Payer: Self-pay | Admitting: *Deleted

## 2020-04-01 DIAGNOSIS — C61 Malignant neoplasm of prostate: Secondary | ICD-10-CM

## 2020-04-01 MED ORDER — ENZALUTAMIDE 40 MG PO CAPS: 80.0000 mg | ORAL_CAPSULE | Freq: Every day | ORAL | 0 refills | Status: DC

## 2020-04-01 MED ORDER — ENZALUTAMIDE 40 MG PO CAPS: 160.0000 mg | ORAL_CAPSULE | Freq: Every day | ORAL | 0 refills | Status: DC

## 2020-04-01 NOTE — Telephone Encounter (Signed)
Patient is taking 2 pills daily at this time, new prescription sent to the pharmacy per Dr. Tomie China last office note.

## 2020-04-13 MED FILL — XTANDI 40 MG CAPSULE: 40 | 30 days supply | Qty: 60 | Fill #0

## 2020-04-14 ENCOUNTER — Other Ambulatory Visit (HOSPITAL_COMMUNITY): Payer: Self-pay

## 2020-04-14 DIAGNOSIS — C61 Malignant neoplasm of prostate: Secondary | ICD-10-CM

## 2020-04-20 ENCOUNTER — Other Ambulatory Visit (HOSPITAL_COMMUNITY): Payer: Medicare Other

## 2020-04-20 ENCOUNTER — Other Ambulatory Visit: Payer: Self-pay

## 2020-04-20 ENCOUNTER — Ambulatory Visit (INDEPENDENT_AMBULATORY_CARE_PROVIDER_SITE_OTHER): Payer: Medicare Other | Admitting: Urology

## 2020-04-20 ENCOUNTER — Inpatient Hospital Stay (HOSPITAL_COMMUNITY): Payer: Medicare Other

## 2020-04-20 ENCOUNTER — Inpatient Hospital Stay (HOSPITAL_COMMUNITY): Payer: Medicare Other | Attending: Hematology | Admitting: Hematology

## 2020-04-20 ENCOUNTER — Encounter: Payer: Self-pay | Admitting: Urology

## 2020-04-20 VITALS — BP 139/81 | HR 62 | Temp 97.3°F | Resp 18 | Wt 149.5 lb

## 2020-04-20 VITALS — BP 135/74 | HR 77 | Temp 97.8°F | Ht 74.0 in | Wt 149.5 lb

## 2020-04-20 DIAGNOSIS — I10 Essential (primary) hypertension: Secondary | ICD-10-CM | POA: Insufficient documentation

## 2020-04-20 DIAGNOSIS — C61 Malignant neoplasm of prostate: Secondary | ICD-10-CM

## 2020-04-20 DIAGNOSIS — Z8042 Family history of malignant neoplasm of prostate: Secondary | ICD-10-CM | POA: Diagnosis not present

## 2020-04-20 DIAGNOSIS — C7951 Secondary malignant neoplasm of bone: Secondary | ICD-10-CM | POA: Insufficient documentation

## 2020-04-20 DIAGNOSIS — Z79899 Other long term (current) drug therapy: Secondary | ICD-10-CM | POA: Diagnosis not present

## 2020-04-20 DIAGNOSIS — Z566 Other physical and mental strain related to work: Secondary | ICD-10-CM | POA: Diagnosis not present

## 2020-04-20 DIAGNOSIS — C779 Secondary and unspecified malignant neoplasm of lymph node, unspecified: Secondary | ICD-10-CM | POA: Insufficient documentation

## 2020-04-20 LAB — CBC WITH DIFFERENTIAL/PLATELET
Abs Immature Granulocytes: 0.01 10*3/uL (ref 0.00–0.07)
Basophils Absolute: 0 10*3/uL (ref 0.0–0.1)
Basophils Relative: 1 %
Eosinophils Absolute: 0.1 10*3/uL (ref 0.0–0.5)
Eosinophils Relative: 4 %
HCT: 45.8 % (ref 39.0–52.0)
Hemoglobin: 14.9 g/dL (ref 13.0–17.0)
Immature Granulocytes: 0 %
Lymphocytes Relative: 23 %
Lymphs Abs: 0.9 10*3/uL (ref 0.7–4.0)
MCH: 32.7 pg (ref 26.0–34.0)
MCHC: 32.5 g/dL (ref 30.0–36.0)
MCV: 100.4 fL — ABNORMAL HIGH (ref 80.0–100.0)
Monocytes Absolute: 0.4 10*3/uL (ref 0.1–1.0)
Monocytes Relative: 10 %
Neutro Abs: 2.3 10*3/uL (ref 1.7–7.7)
Neutrophils Relative %: 62 %
Platelets: 249 10*3/uL (ref 150–400)
RBC: 4.56 MIL/uL (ref 4.22–5.81)
RDW: 13.8 % (ref 11.5–15.5)
WBC: 3.7 10*3/uL — ABNORMAL LOW (ref 4.0–10.5)
nRBC: 0 % (ref 0.0–0.2)

## 2020-04-20 LAB — COMPREHENSIVE METABOLIC PANEL
ALT: 13 U/L (ref 0–44)
AST: 19 U/L (ref 15–41)
Albumin: 3.9 g/dL (ref 3.5–5.0)
Alkaline Phosphatase: 44 U/L (ref 38–126)
Anion gap: 8 (ref 5–15)
BUN: 13 mg/dL (ref 8–23)
CO2: 30 mmol/L (ref 22–32)
Calcium: 9.3 mg/dL (ref 8.9–10.3)
Chloride: 102 mmol/L (ref 98–111)
Creatinine, Ser: 1.05 mg/dL (ref 0.61–1.24)
GFR calc Af Amer: >60 mL/min (ref >60)
GFR calc non Af Amer: >60 mL/min (ref >60)
Glucose, Bld: 77 mg/dL (ref 70–99)
Potassium: 4.3 mmol/L (ref 3.5–5.1)
Sodium: 140 mmol/L (ref 135–145)
Total Bilirubin: 0.7 mg/dL (ref 0.3–1.2)
Total Protein: 6.5 g/dL (ref 6.5–8.1)

## 2020-04-20 LAB — PSA: Prostatic Specific Antigen: 1.15 ng/mL (ref 0.00–4.00)

## 2020-04-20 LAB — URINALYSIS, ROUTINE W REFLEX MICROSCOPIC
Bilirubin, UA: NEGATIVE
Glucose, UA: NEGATIVE
Ketones, UA: NEGATIVE
Leukocytes,UA: NEGATIVE
Nitrite, UA: NEGATIVE
Protein,UA: NEGATIVE
RBC, UA: NEGATIVE
Specific Gravity, UA: 1.015 (ref 1.005–1.030)
Urobilinogen, Ur: 0.2 mg/dL (ref 0.2–1.0)
pH, UA: 7 (ref 5.0–7.5)

## 2020-04-20 NOTE — Patient Instructions (Signed)
[place] at [address] [facility] Discharge Instructions  You were seen today by Dr. Delton Coombes. He went over your recent results. You will be referred to a geneticist for further analysis. Dr. Delton Coombes will see you back in 6 weeks for labs and follow up.   Thank you for choosing [place] at [facility] to provide your oncology and hematology care.  To afford each patient quality time with our provider, please arrive at least 15 minutes before your scheduled appointment time.   If you have a lab appointment with the [facility] please come in thru the Main Entrance and check in at the main information desk  You need to re-schedule your appointment should you arrive 10 or more minutes late.  We strive to give you quality time with our providers, and arriving late affects you and other patients whose appointments are after yours.  Also, if you no show three or more times for appointments you may be dismissed from the clinic at the providers discretion.     Again, thank you for choosing [facility].  Our hope is that these requests will decrease the amount of time that you wait before being seen by our physicians.       _____________________________________________________________  Should you have questions after your visit to [facility], please contact our office at [phone] between the hours of 8:00 a.m. and 4:30 p.m.  Voicemails left after 4:00 p.m. will not be returned until the following business day.  For prescription refill requests, have your pharmacy contact our office and allow 72 hours.    Cancer Center Support Programs:   > Cancer Support Group  2nd Tuesday of the month 1pm-2pm, Journey Room
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
## 2020-04-20 NOTE — Progress Notes (Signed)
Urological Symptom Review  Patient is experiencing the following symptoms: Get up at night to urinate   Review of Systems  Gastrointestinal (upper)  : Negative for upper GI symptoms  Gastrointestinal (lower) : Negative for lower GI symptoms  Constitutional : Night Sweats Fatigue  Skin: Negative for skin symptoms  Eyes: Negative for eye symptoms  Ear/Nose/Throat : Negative for Ear/Nose/Throat symptoms  Hematologic/Lymphatic: Negative for Hematologic/Lymphatic symptoms  Cardiovascular : Negative for cardiovascular symptoms  Respiratory : Negative for respiratory symptoms  Endocrine: Negative for endocrine symptoms  Musculoskeletal: Negative for musculoskeletal symptoms  Neurological: Negative for neurological symptoms  Psychologic: Depression Anxiety

## 2020-04-20 NOTE — Progress Notes (Signed)
H&P  Chief Complaint: Prostate Cancer  History of Present Illness:  9.14.2021: PSA 2.39 Pt notes that discontinuation of his Zytiga & prednisone regimen resulted in increased energy.  He is in a bit better spirits compared to 2 months ago as he has been feeling better, PSA is lower and liver function enzymes are better.  He brings me a written list of multiple psychiatric complaints that he has.  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  He is somewhat interested in therapy, but does have reservations.  He does have issues dating back to his mother who was bipolar and had significant psychiatric problems.  (below copied from [facility] records):  Nathan Smith comes in today for follow-up of his prostate cancer.  He is now on Zytiga and prednisone.  He has had significant decrease in his PSA from 11-8 now down to about 3.  Unfortunately, he is having significant side effects from the prednisone.  Also, his LFTs elevated with institution of Zytiga.  His prednisone is bothering him personality wise.  He is having suicidal ideations.  He is optimistic about his PSA decreased, however.  He is here today for a 11-month leuprolide injection.  Dr. Delton Smith is caring for his second line therapy i.e. Zytiga/prednisone.  Relevant past history is as follows:  Prostate Cancer: Nathan Smith is a 69 year-old male established patient who is here evaluation for treatment of prostate cancer.  His prostate cancer was diagnosed approximately 03/07/2013. He does have the pathology report from his biopsy. His cancer was diagnosed by Nathan Smith. His PSA at his time of diagnosis was 17. His most recent PSA is 5.7.   He has undergone External Beam Radiation Therapy for treatment. He has undergone Hormonal Therapy for treatment.   Initially underwent TRUS/Bx 8.1.2014. All biopsies  revealed adenocarcinoma, GS 4+3 throughout the prostate. Right seminal vesicle revealed a GS 4+4, left revealed a GS 4+3. He underwent radiographic imaging including a bone scan and CT of the abdomen and pelvis. CT failed to reveal any significant adenopathy or evidence of extra prostatic extension. It was recommended that he get 2 years of androgen deprivation. He received his first [place] on 5.13.2014. He received his 1st 6 month Lupron on 6.13.2014. Because his testosterone was not quite castrate, he was switched from Lupron to [employer]. He received a 6 month injection of that in mid -December, 2014. His last injection was on 2.22.2016.   12.11.2018: Elevating trend was PSA, most recently 2.8. He has stable urinary symptomatology. He has been extremely anxious of undergoing a repeat biopsy.   10.6.2020: He returns today for follow-up -- he has been being seen by [facility] for his prostate cancer. He was initiated onADT. Most recent PSA was 5.2. Most recent CT and bone scan apparently showed no signs of metastatic disease (2019) -- he has had no repeat imaging. He is not very happy on lupron -- he is having hot flashes 2x a day. He is due his next 6 mo lupron this November.   12.8.2020: PSA 5.7 on 12.1.2020. He reports having some okay days and some awful days with irritability, low energy, and fatigue -- he attributes this to lupron. Testosterone (12.1) measured 29. He has been having surveillance imaging at [facility]. He is due 6 mo lupron today.  4.27.2021:Most recent PSA 11.1, T level 18.Stable symptomatology. He  denies any new bony aches/pain though he does c/o some abdominal aching (he thinks this is mostly dietary). Weight remains stable. He denies any blood per urine or stool. Stable bowel pattern.  BPH:  He is now off of medical therapy for BPH. He denies significant LUTS.  Past Medical History:  Diagnosis Date  . Anxiety   . Bilateral hydronephrosis   . Bladder incontinence    night  time,  past 2 months  . BPH (benign prostatic hypertrophy) with urinary obstruction   . Depression   . ED (erectile dysfunction)   . H/O ascites    trace  . Hypertension   . Prostate cancer ([place]) 09/24/12   gleason 4+3=7., & 4+4=8,PSA=67.30, volume=33.7cc    Past Surgical History:  Procedure Laterality Date  . PROSTATE BIOPSY  09/24/12   Adenocarcinoma  . PROSTATE BIOPSY N/A 12/20/2017   Procedure: BIOPSY TRANSRECTAL ULTRASONIC PROSTATE (TUBP);  Surgeon: Franchot Gallo, MD;  Location: [facility];  Service: Urology;  Laterality: N/A;  . TONSILLECTOMY      Home Medications:  Allergies as of 04/20/2020   No Known Allergies     Medication List       Accurate as of April 20, 2020 12:58 PM. If you have any questions, ask your nurse or doctor.        Align 4 MG Caps Take 1 capsule by mouth daily.   b complex vitamins capsule Take 1 capsule by mouth daily.   Cholecalciferol 125 MCG (5000 UT) capsule Take 5,000 Units by mouth daily.   Curcumin 95 500 MG Caps Generic drug: Turmeric Take 1 capsule by mouth daily.   enzalutamide 40 MG capsule Commonly known as: XTANDI Take 2 capsules (80 mg total) by mouth daily.   Ginseng 100 MG Caps Take by mouth daily.   glucosamine-chondroitin 500-400 MG tablet Take 1 tablet by mouth daily.   L-ARGININE PO Take 1,000 mg by mouth daily.   losartan 25 MG tablet Commonly known as: COZAAR Take 1 tablet (25 mg total) by mouth daily.   melatonin 1 MG Tabs tablet Take 2 mg by mouth at bedtime.   methylphenidate 5 MG tablet Commonly known as: Ritalin Take 0.5 tablets (2.5 mg total) by mouth daily as needed.   Probiotic 1-250 BILLION-MG Caps Take by mouth daily.   tamsulosin 0.4 MG Caps capsule Commonly known as: FLOMAX Take 1 capsule (0.4 mg total) by mouth daily.   vitamin C 1000 MG tablet Take 1,000 mg by mouth daily.   zinc gluconate 50 MG tablet Take 50 mg by mouth daily.       Allergies: No Known  Allergies  Family History  Problem Relation Age of Onset  . Depression Mother   . Dementia Mother   . Anxiety disorder Mother   . Cancer Father        prostate  . Cancer Paternal Grandfather        prostate    Social History:  reports that he has never smoked. He has never used smokeless tobacco. He reports current alcohol use of about 2.0 standard drinks of alcohol per week. He reports that he does not use drugs.  ROS: A complete review of systems was performed.  All systems are negative except for pertinent findings as noted.  Physical Exam:  Vital signs in last 24 hours: There were no vitals taken for this visit. Constitutional:  Alert and oriented, No acute distress Respiratory: Normal respiratory effort Neurologic: Grossly intact, no focal deficits Psychiatric: Normal  mood and affect  I have reviewed prior pt notes  I have reviewed notes from referring/previous physicians  I have reviewed urinalysis results  I have independently reviewed prior imaging  I have reviewed prior PSA/testosterone results    Impression/Assessment:  Castrate resistant prostate cancer, on second line therapy with adequate recent response.  He does have skeletal and lymphatic metastatic disease although he has a downward PSA trend and is feeling well at this point  Psychiatric concerns, not addressed yet.  Plan:  1. Pt encouraged and advised regarding his PCa prognosis.  2. Pt referred to psychiatrist for mental support.  3. F/U in 2 months for OV and symptom recheck.  CC: Dr. Allyn Kenner

## 2020-04-21 ENCOUNTER — Other Ambulatory Visit (HOSPITAL_COMMUNITY): Payer: Self-pay

## 2020-04-21 DIAGNOSIS — C61 Malignant neoplasm of prostate: Secondary | ICD-10-CM

## 2020-04-21 LAB — TESTOSTERONE: Testosterone: 11 ng/dL — ABNORMAL LOW (ref 264–916)

## 2020-04-27 ENCOUNTER — Other Ambulatory Visit: Payer: Self-pay | Admitting: Urology

## 2020-04-27 DIAGNOSIS — C61 Malignant neoplasm of prostate: Secondary | ICD-10-CM

## 2020-04-27 DIAGNOSIS — F418 Other specified anxiety disorders: Secondary | ICD-10-CM

## 2020-05-04 ENCOUNTER — Other Ambulatory Visit: Payer: Self-pay | Admitting: Urology

## 2020-05-13 ENCOUNTER — Encounter: Payer: Medicare Other | Admitting: Genetic Counselor

## 2020-05-24 ENCOUNTER — Other Ambulatory Visit (HOSPITAL_COMMUNITY): Payer: Self-pay | Admitting: Hematology

## 2020-05-24 DIAGNOSIS — C61 Malignant neoplasm of prostate: Secondary | ICD-10-CM

## 2020-05-27 MED FILL — XTANDI 40 MG CAPSULE: 40 | 30 days supply | Qty: 60 | Fill #0

## 2020-06-03 ENCOUNTER — Inpatient Hospital Stay (HOSPITAL_COMMUNITY): Payer: Medicare Other | Attending: Hematology

## 2020-06-03 ENCOUNTER — Other Ambulatory Visit: Payer: Self-pay

## 2020-06-03 DIAGNOSIS — C7951 Secondary malignant neoplasm of bone: Secondary | ICD-10-CM | POA: Diagnosis not present

## 2020-06-03 DIAGNOSIS — C61 Malignant neoplasm of prostate: Secondary | ICD-10-CM

## 2020-06-03 LAB — PSA: Prostatic Specific Antigen: 0.73 ng/mL (ref 0.00–4.00)

## 2020-06-03 LAB — CBC WITH DIFFERENTIAL/PLATELET
Abs Immature Granulocytes: 0.02 10*3/uL (ref 0.00–0.07)
Basophils Absolute: 0 10*3/uL (ref 0.0–0.1)
Basophils Relative: 1 %
Eosinophils Absolute: 0.2 10*3/uL (ref 0.0–0.5)
Eosinophils Relative: 3 %
HCT: 47.5 % (ref 39.0–52.0)
Hemoglobin: 15.5 g/dL (ref 13.0–17.0)
Immature Granulocytes: 0 %
Lymphocytes Relative: 20 %
Lymphs Abs: 1 10*3/uL (ref 0.7–4.0)
MCH: 32.5 pg (ref 26.0–34.0)
MCHC: 32.6 g/dL (ref 30.0–36.0)
MCV: 99.6 fL (ref 80.0–100.0)
Monocytes Absolute: 0.6 10*3/uL (ref 0.1–1.0)
Monocytes Relative: 11 %
Neutro Abs: 3.2 10*3/uL (ref 1.7–7.7)
Neutrophils Relative %: 65 %
Platelets: 311 10*3/uL (ref 150–400)
RBC: 4.77 MIL/uL (ref 4.22–5.81)
RDW: 13.4 % (ref 11.5–15.5)
WBC: 4.9 10*3/uL (ref 4.0–10.5)
nRBC: 0 % (ref 0.0–0.2)

## 2020-06-03 LAB — COMPREHENSIVE METABOLIC PANEL
ALT: 13 U/L (ref 0–44)
AST: 19 U/L (ref 15–41)
Albumin: 4 g/dL (ref 3.5–5.0)
Alkaline Phosphatase: 49 U/L (ref 38–126)
Anion gap: 9 (ref 5–15)
BUN: 16 mg/dL (ref 8–23)
CO2: 30 mmol/L (ref 22–32)
Calcium: 9.5 mg/dL (ref 8.9–10.3)
Chloride: 100 mmol/L (ref 98–111)
Creatinine, Ser: 1.01 mg/dL (ref 0.61–1.24)
GFR, Estimated: >60 mL/min (ref >60)
Glucose, Bld: 96 mg/dL (ref 70–99)
Potassium: 4.5 mmol/L (ref 3.5–5.1)
Sodium: 139 mmol/L (ref 135–145)
Total Bilirubin: 0.9 mg/dL (ref 0.3–1.2)
Total Protein: 6.8 g/dL (ref 6.5–8.1)

## 2020-06-04 LAB — TESTOSTERONE: Testosterone: 12 ng/dL — ABNORMAL LOW (ref 264–916)

## 2020-06-07 ENCOUNTER — Inpatient Hospital Stay (HOSPITAL_COMMUNITY): Payer: Medicare Other | Attending: Hematology | Admitting: Hematology

## 2020-06-07 ENCOUNTER — Other Ambulatory Visit (HOSPITAL_COMMUNITY): Payer: Medicare Other

## 2020-06-07 ENCOUNTER — Other Ambulatory Visit: Payer: Self-pay

## 2020-06-07 ENCOUNTER — Encounter (HOSPITAL_COMMUNITY): Payer: Self-pay | Admitting: Hematology

## 2020-06-07 VITALS — BP 153/85 | HR 61 | Temp 97.2°F | Resp 18 | Wt 151.6 lb

## 2020-06-07 DIAGNOSIS — C779 Secondary and unspecified malignant neoplasm of lymph node, unspecified: Secondary | ICD-10-CM | POA: Diagnosis not present

## 2020-06-07 DIAGNOSIS — C7951 Secondary malignant neoplasm of bone: Secondary | ICD-10-CM | POA: Insufficient documentation

## 2020-06-07 DIAGNOSIS — I10 Essential (primary) hypertension: Secondary | ICD-10-CM | POA: Insufficient documentation

## 2020-06-07 DIAGNOSIS — C61 Malignant neoplasm of prostate: Secondary | ICD-10-CM

## 2020-06-07 NOTE — Patient Instructions (Signed)
[place] at [facility] Discharge Instructions  You were seen today by Dr. Delton Coombes. He went over your recent results. You will be referred to psychiatry for your anxiety and depression. Continue taking enzalutamide 2 tablets daily. Dr. Delton Coombes will see you back in 10 weeks for labs and follow up.   Thank you for choosing [place] at [facility] to provide your oncology and hematology care.  To afford each patient quality time with our provider, please arrive at least 15 minutes before your scheduled appointment time.   If you have a lab appointment with the [facility] please come in thru the Main Entrance and check in at the main information desk  You need to re-schedule your appointment should you arrive 10 or more minutes late.  We strive to give you quality time with our providers, and arriving late affects you and other patients whose appointments are after yours.  Also, if you no show three or more times for appointments you may be dismissed from the clinic at the providers discretion.     Again, thank you for choosing [facility].  Our hope is that these requests will decrease the amount of time that you wait before being seen by our physicians.       _____________________________________________________________  Should you have questions after your visit to [facility], please contact our office at [phone] between the hours of 8:00 a.m. and 4:30 p.m.  Voicemails left after 4:00 p.m. will not be returned until the following business day.  For prescription refill requests, have your pharmacy contact our office and allow 72 hours.    Cancer Center Support Programs:   > Cancer Support Group  2nd Tuesday of the month 1pm-2pm, Journey Room

## 2020-06-07 NOTE — Progress Notes (Signed)
Nathan Smith, Nathan Smith [id number]   CLINIC:  Medical Oncology/Hematology  PCP:  Patient, No Pcp Per None None   REASON FOR VISIT:  Follow-up for castration resistant prostate cancer  PRIOR THERAPY: External beam radiation from 05/27/2013 to 07/28/2013.  NGS Results: Not done  CURRENT THERAPY: Lupron Q 6 months and enzalutamide QD  BRIEF ONCOLOGIC HISTORY:  Oncology History  Prostate cancer ([place])  09/24/2012 Initial Diagnosis   Prostate cancer ([place])   12/26/2019 Cancer Staging   Staging form: Prostate, AJCC 7th Edition - Clinical stage from 12/26/2019: Stage IV (yTX, N1, M1b, PSA: 10 to 19, Gleason 8-10) - Signed by Derek Jack, MD on 12/26/2019     CANCER STAGING: Cancer Staging Prostate cancer [facility]) Staging form: Prostate, AJCC 7th Edition - Clinical stage from 12/26/2019: Stage IV (yTX, N1, M1b, PSA: 10 to 19, Gleason 8-10) - Signed by Derek Jack, MD on 12/26/2019   INTERVAL HISTORY:  [name], a 69 y.o. male, returns for routine follow-up of his castration resistant prostate cancer. Nathan Smith was last seen on 04/20/2020.   Today he reports feeling okay. He reports fatigue from enzalutamide, though not as bad as from [place]; he denies N/V or falls. He reports thoughts of anxiety and depression and hopelessness, though denies thoughts of hurting himself. He has never tried taking anxiolytics and had a bad effect after taking Paxil years ago. He is trying to medicate himself with herbs. His appetite is excellent and he is eating everything; he tried CBD gummies and denies feelings of anxiety induced by marijuana which he tried years ago.   He was supposed to see the psychiatrist with referral from [facility] but was never informed. He has a genetics appointment on 11/8.   REVIEW OF SYSTEMS:  Review of Systems  Constitutional: Positive for fatigue (50%). Negative for appetite change.  Cardiovascular: Positive for  palpitations (intermittent at night).  Gastrointestinal: Negative for nausea and vomiting.  Psychiatric/Behavioral: Positive for depression and sleep disturbance. The patient is nervous/anxious.   All other systems reviewed and are negative.   PAST MEDICAL/SURGICAL HISTORY:  Past Medical History:  Diagnosis Date   Anxiety    Bilateral hydronephrosis    Bladder incontinence    night time,  past 2 months   BPH (benign prostatic hypertrophy) with urinary obstruction    Depression    ED (erectile dysfunction)    H/O ascites    trace   Hypertension    Prostate cancer ([place]) 09/24/12   gleason 4+3=7., & 4+4=8,PSA=67.30, volume=33.7cc   Past Surgical History:  Procedure Laterality Date   PROSTATE BIOPSY  09/24/12   Adenocarcinoma   PROSTATE BIOPSY N/A 12/20/2017   Procedure: BIOPSY TRANSRECTAL ULTRASONIC PROSTATE (TUBP);  Surgeon: Franchot Gallo, MD;  Location: [facility];  Service: Urology;  Laterality: N/A;   TONSILLECTOMY      SOCIAL HISTORY:  Social History   Socioeconomic History   Marital status: Single    Spouse name: Not on file   Number of children: 0   Years of education: Not on file   Highest education level: Not on file  Occupational History   Occupation: [name]    Employer: [employer]  Tobacco Use   Smoking status: Never Smoker   Smokeless tobacco: Never Used  Vaping Use   Vaping Use: Never used  Substance and Sexual Activity   Alcohol use: Yes    Alcohol/week: 2.0 standard drinks    Types: 2 Cans of  beer per week    Comment: 3 per day, beer/wine , hx etoh abuse years ago   Drug use: No    Comment: hx   Sexual activity: Yes    Birth control/protection: Condom  Other Topics Concern   Not on file  Social History Narrative   Not on file   Social Determinants of Health   Financial Resource Strain: Low Risk    Difficulty of Paying Living Expenses: Not very hard  Food Insecurity: No Food Insecurity    Worried About Charity fundraiser in the Last Year: Never true   Ran Out of Food in the Last Year: Never true  Transportation Needs: No Transportation Needs   Lack of Transportation (Medical): No   Lack of Transportation (Non-Medical): No  Physical Activity: Sufficiently Active   Days of Exercise per Week: 6 days   Minutes of Exercise per Session: 60 min  Stress: Stress Concern Present   Feeling of Stress : To some extent  Social Connections: Unknown   Frequency of Communication with Friends and Family: Twice a week   Frequency of Social Gatherings with Friends and Family: Once a week   Attends Religious Services: More than 4 times per year   Active Member of [employer] or Organizations: No   Attends Music therapist: More than 4 times per year   Marital Status: Not on file  Intimate Partner Violence: Not At Risk   Fear of Current or Ex-Partner: No   Emotionally Abused: No   Physically Abused: No   Sexually Abused: No    FAMILY HISTORY:  Family History  Problem Relation Age of Onset   Depression Mother    Dementia Mother    Anxiety disorder Mother    Cancer Father        prostate   Cancer Paternal Grandfather        prostate    CURRENT MEDICATIONS:  Current Outpatient Medications  Medication Sig Dispense Refill   Ascorbic Acid (VITAMIN C) 1000 MG tablet Take 1,000 mg by mouth daily.     b complex vitamins capsule Take 1 capsule by mouth daily.     Bacillus Coagulans-Inulin (PROBIOTIC) 1-250 BILLION-MG CAPS Take by mouth daily.      Cholecalciferol 125 MCG (5000 UT) capsule Take 5,000 Units by mouth daily.      Ginseng 100 MG CAPS Take by mouth daily.      glucosamine-chondroitin 500-400 MG tablet Take 1 tablet by mouth daily.      L-ARGININE PO Take 1,000 mg by mouth daily.      losartan (COZAAR) 25 MG tablet Take 1 tablet (25 mg total) by mouth daily. 90 tablet 3   melatonin 1 MG TABS tablet Take 2 mg by mouth at bedtime.       methylphenidate (RITALIN) 5 MG tablet Take 0.5 tablets (2.5 mg total) by mouth daily as needed. 10 tablet 0   Probiotic Product (ALIGN) 4 MG CAPS Take 1 capsule by mouth daily.      tamsulosin (FLOMAX) 0.4 MG CAPS capsule Take 1 capsule (0.4 mg total) by mouth daily. 90 capsule 3   Turmeric (CURCUMIN 95) 500 MG CAPS Take 1 capsule by mouth daily.      XTANDI 40 MG capsule TAKE 2 CAPSULES (80 MG TOTAL) BY MOUTH DAILY. 60 capsule 0   zinc gluconate 50 MG tablet Take 50 mg by mouth daily.     No current facility-administered medications for this visit.    ALLERGIES:  No Known Allergies  PHYSICAL EXAM:  Performance status (ECOG): 0 - Asymptomatic  Vitals:   06/07/20 1458  BP: (!) 153/85  Pulse: 61  Resp: 18  Temp: (!) 97.2 F (36.2 C)  SpO2: 100%   Wt Readings from Last 3 Encounters:  06/07/20 151 lb 9.6 oz (68.8 kg)  04/20/20 149 lb 8 oz (67.8 kg)  04/20/20 149 lb 8 oz (67.8 kg)   Physical Exam Vitals reviewed.  Constitutional:      Appearance: Normal appearance.  Neurological:     General: No focal deficit present.     Mental Status: He is alert and oriented to person, place, and time.  Psychiatric:        Mood and Affect: Mood normal.        Behavior: Behavior normal.      LABORATORY DATA:  I have reviewed the labs as listed.  CBC Latest Ref Rng & Units 06/03/2020 04/20/2020 03/22/2020  WBC 4.0 - 10.5 K/uL 4.9 3.7(L) 4.2  Hemoglobin 13.0 - 17.0 g/dL 15.5 14.9 14.9  Hematocrit 39 - 52 % 47.5 45.8 45.9  Platelets 150 - 400 K/uL 311 249 238   CMP Latest Ref Rng & Units 06/03/2020 04/20/2020 03/22/2020  Glucose 70 - 99 mg/dL 96 77 114(H)  BUN 8 - 23 mg/dL 16 13 14   Creatinine 0.61 - 1.24 mg/dL 1.01 1.05 0.95  Sodium 135 - 145 mmol/L 139 140 139  Potassium 3.5 - 5.1 mmol/L 4.5 4.3 4.7  Chloride 98 - 111 mmol/L 100 102 102  CO2 22 - 32 mmol/L 30 30 29   Calcium 8.9 - 10.3 mg/dL 9.5 9.3 9.4  Total Protein 6.5 - 8.1 g/dL 6.8 6.5 6.6  Total Bilirubin 0.3 - 1.2 mg/dL  0.9 0.7 0.7  Alkaline Phos 38 - 126 U/L 49 44 53  AST 15 - 41 U/L 19 19 22   ALT 0 - 44 U/L 13 13 17    Lab Results  Component Value Date   PSA 0.73 06/03/2020   PSA 1.15 04/20/2020    DIAGNOSTIC IMAGING:  I have independently reviewed the scans and discussed with the patient. No results found.   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 -External beam radiation with 2 years of Lupron from 05/27/2013 through 07/28/2013. Received Lupron until 09/28/2014. -Rising PSA levels in December 2018,of2.8. Local recurrence was confirmed with biopsy. -Bone scan on 04/12/2018 did not show any evidence of metastatic disease. -He was evaluated by Dr. Iona Beard at [facility]. Bone scan and CT scan at [facility] on 07/02/2018 did not show any evidence of metastatic disease. Left para-aortic lymph node measures 8 mm. Lupron started for nonmetastatic HSPC in November 2019 for rapid PSA doubling time of less than 3 months. He had difficulty tolerating Lupron with irritability, low energy and fatigue. -PSA 5.7 (07/08/2019), PSA 11.1 (11/26/2019) with testosterone 18. -Fluciclovine PET CT scan on 12/23/2019 showed T11 meta stasis. Asymmetric hypermetabolism within the prostate suspicious for residual disease. Mild enlargement of 2 adjacent lower left para-aortic nodes, largest 8 mm with SUV of 3.4. These nodes measured maximally 5 mm on PET scan on 02/25/2018. -Abiraterone 500 mg daily and prednisonefrom 01/03/2020 through 02/16/2020, discontinued secondary to elevated LFTs. -We will consider checking germline and somatic mutation testing. -Lupron 45 mg on 02/17/2020 at Dr. Alan Ripper office. -Enzalutamide 80 mg daily started on 03/10/2020.   PLAN:  1. Castration resistant prostate cancer to the bones and  lymph nodes: -He is tolerating enzalutamide 80 mg without any major side effects. -He is continuing to  suffer from anxiety, anger and frustration at times.  He also feels depressed.  He has not seen psychiatrist.  He told me that he has no thoughts of harming himself.  But he has occasionally felt losing interest in life.  We will make a referral to psychiatry. -Reviewed labs from 05/26/2020.  LFTs are normal. -PSA improved to 0.73 from 1.15 previously.  Testosterone is 12. -Recommend continuing enzalutamide 80 mg daily.  RTC 10 weeks with repeat labs including PSA.  2. Bone metastasis: -We have previously discussed about starting him on denosumab.  He would like to wait.  Continue calcium and vitamin D supplements.  3. Family history: -His father might have had prostate cancer. -I have again counseled him to undergo genetic testing.  He agreed.  4. Hypertension: -Continue losartan 25 mg daily.  5. Weight loss: -He has gained 2 pounds in the last 1 month.   Orders placed this encounter:  No orders of the defined types were placed in this encounter.    Derek Jack, MD [place] [phone]   I, Milinda Antis, am acting as a scribe for Dr. Sanda Linger.  I, Derek Jack MD, have reviewed the above documentation for accuracy and completeness, and I agree with the above.

## 2020-06-07 NOTE — Progress Notes (Signed)
Pt is taking Xtandi as prescribed with the following side effects:  Depression/anxiety, fatigue, mood swings, and irritability.  Dr. Delton Coombes made aware of how the pt is feeling.

## 2020-06-17 ENCOUNTER — Ambulatory Visit (HOSPITAL_COMMUNITY): Payer: Medicare Other | Admitting: Genetic Counselor

## 2020-06-17 ENCOUNTER — Encounter (HOSPITAL_COMMUNITY): Payer: Self-pay | Admitting: Genetic Counselor

## 2020-06-17 ENCOUNTER — Inpatient Hospital Stay (HOSPITAL_COMMUNITY): Payer: Medicare Other

## 2020-06-17 ENCOUNTER — Other Ambulatory Visit: Payer: Self-pay

## 2020-06-17 ENCOUNTER — Inpatient Hospital Stay (HOSPITAL_BASED_OUTPATIENT_CLINIC_OR_DEPARTMENT_OTHER): Payer: Medicare Other | Admitting: Genetic Counselor

## 2020-06-17 DIAGNOSIS — C61 Malignant neoplasm of prostate: Secondary | ICD-10-CM
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
## 2020-06-21 ENCOUNTER — Inpatient Hospital Stay (HOSPITAL_COMMUNITY): Payer: Medicare Other

## 2020-06-21 ENCOUNTER — Other Ambulatory Visit: Payer: Self-pay

## 2020-06-21 ENCOUNTER — Other Ambulatory Visit (HOSPITAL_COMMUNITY): Payer: Self-pay | Admitting: Hematology

## 2020-06-21 DIAGNOSIS — C61 Malignant neoplasm of prostate: Secondary | ICD-10-CM

## 2020-06-22 ENCOUNTER — Other Ambulatory Visit: Payer: Self-pay

## 2020-06-22 ENCOUNTER — Ambulatory Visit (INDEPENDENT_AMBULATORY_CARE_PROVIDER_SITE_OTHER): Payer: Medicare Other | Admitting: Urology

## 2020-06-22 ENCOUNTER — Encounter: Payer: Self-pay | Admitting: Urology

## 2020-06-22 VITALS — BP 168/87 | HR 65 | Temp 98.0°F | Ht 74.0 in | Wt 152.0 lb

## 2020-06-22 DIAGNOSIS — C61 Malignant neoplasm of prostate: Secondary | ICD-10-CM | POA: Diagnosis not present

## 2020-06-22 LAB — URINALYSIS, ROUTINE W REFLEX MICROSCOPIC
Bilirubin, UA: NEGATIVE
Glucose, UA: NEGATIVE
Ketones, UA: NEGATIVE
Leukocytes,UA: NEGATIVE
Nitrite, UA: NEGATIVE
Protein,UA: NEGATIVE
RBC, UA: NEGATIVE
Specific Gravity, UA: 1.015 (ref 1.005–1.030)
Urobilinogen, Ur: 0.2 mg/dL (ref 0.2–1.0)
pH, UA: 7 (ref 5.0–7.5)

## 2020-06-22 NOTE — Progress Notes (Signed)
H&P  Chief Complaint: Prostate Cancer  History of Present Illness:   11.16.2021: Pt started on Xtandi by Dr. Delton Coombes and reports that he is tolerating this side-effect profile better than Zytiga. He expresses concern regarding a perceived psychological impact with Lupron injections. Pt notes that his hot flashes have recently improved during the last couple of weeks.  He is happy that his PSA has been trending downward.  At his last visit I suggested psychiatric consultation. Order was put in for this, but it was never carried out.  (below copied from [facility] records):  Nathan Smith comes in today for follow-up of his prostate cancer. He is now on Zytiga and prednisone. He has had significant decrease in his PSA from 11-8 now down to about 3.  Unfortunately, he is having significant side effects from the prednisone. Also, his LFTs elevated with institution of Zytiga. His prednisone is bothering him personality wise. He is having suicidal ideations. He is optimistic about his PSA decreased, however. He is here today for a 26-month leuprolide injection. Dr. Delton Coombes is caring for his second line therapy i.e. Zytiga/prednisone.  Relevant past history is as follows:  Prostate Cancer: Nathan Smith is a 69 year-old male established patient who is here evaluation for treatment of prostate cancer.   Initially underwent TRUS/Bx 8.1.2014. All biopsies revealed adenocarcinoma, GS 4+3 throughout the prostate. Right seminal vesicle revealed a GS 4+4, left revealed a GS 4+3. He underwent radiographic imaging including a bone scan and CT of the abdomen and pelvis. CT failed to reveal any significant adenopathy or evidence of extra prostatic extension. It was recommended that he get 2 years of androgen deprivation. He received his first [place] on 5.13.2014. He received his 1st 6 month Lupron on 6.13.2014. Because his testosterone was not quite castrate, he was switched from Lupron to [employer]. He  received a 6 month injection of that in mid -December, 2014. .   12.11.2018: Elevating trend was PSA, most recently 2.8. He has stable urinary symptomatology. He has been extremely anxious of undergoing a repeat biopsy.   10.6.2020: He returns today for follow-up -- he has been being seen by [facility] for his prostate cancer. He was initiated onADT. Most recent PSA was 5.2. Most recent CT and bone scan apparently showed no signs of metastatic disease (2019) -- he has had no repeat imaging. He is not very happy on lupron -- he is having hot flashes 2x a day. He is due his next 6 mo lupron this November.   12.8.2020: PSA 5.7 on 12.1.2020. He reports having some okay days and some awful days with irritability, low energy, and fatigue -- he attributes this to lupron. Testosterone (12.1) measured 29. He has been having surveillance imaging at [facility]. He is due 6 mo lupron today.  4.27.2021:Most recent PSA 11.1, T level 18.Stable symptomatology. He denies any new bony aches/pain though he does c/o some abdominal aching (he thinks this is mostly dietary). Weight remains stable. He denies any blood per urine or stool. Stable bowel pattern.  7.2021--received 70-month Eligard.  BPH:  He is now off of medical therapy for BPH. He denies significant LUTS.  9.14.2021: Pt notes that discontinuation of his Zytiga & prednisone regimen resulted in increased energy.  He is in a bit better spirits compared to 2 months ago as he has been feeling better, PSA is lower and liver function enzymes are better.  He brings me a written list of multiple psychiatric complaints that he has.  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  He is somewhat interested in therapy, but does have reservations.  He does have issues dating back to his mother who was bipolar and had significant psychiatric  problems.  Past Medical History:  Diagnosis Date  . Anxiety   . Bilateral hydronephrosis   . Bladder incontinence    night time,  past 2 months  . BPH (benign prostatic hypertrophy) with urinary obstruction   . Depression   . ED (erectile dysfunction)   . H/O ascites    trace  . Hypertension   . Prostate cancer ([place]) 09/24/12   gleason 4+3=7., & 4+4=8,PSA=67.30, volume=33.7cc    Past Surgical History:  Procedure Laterality Date  . PROSTATE BIOPSY  09/24/12   Adenocarcinoma  . PROSTATE BIOPSY N/A 12/20/2017   Procedure: BIOPSY TRANSRECTAL ULTRASONIC PROSTATE (TUBP);  Surgeon: Franchot Gallo, MD;  Location: [facility];  Service: Urology;  Laterality: N/A;  . TONSILLECTOMY      Home Medications:  Allergies as of 06/22/2020   No Known Allergies     Medication List       Accurate as of June 22, 2020  1:07 PM. If you have any questions, ask your nurse or doctor.        Align 4 MG Caps Take 1 capsule by mouth daily.   b complex vitamins capsule Take 1 capsule by mouth daily.   Cholecalciferol 125 MCG (5000 UT) capsule Take 5,000 Units by mouth daily.   Curcumin 95 500 MG Caps Generic drug: Turmeric Take 1 capsule by mouth daily.   Ginseng 100 MG Caps Take by mouth daily.   glucosamine-chondroitin 500-400 MG tablet Take 1 tablet by mouth daily.   L-ARGININE PO Take 1,000 mg by mouth daily.   losartan 25 MG tablet Commonly known as: COZAAR Take 1 tablet (25 mg total) by mouth daily.   melatonin 1 MG Tabs tablet Take 2 mg by mouth at bedtime.   methylphenidate 5 MG tablet Commonly known as: Ritalin Take 0.5 tablets (2.5 mg total) by mouth daily as needed.   Probiotic 1-250 BILLION-MG Caps Take by mouth daily.   tamsulosin 0.4 MG Caps capsule Commonly known as: FLOMAX Take 1 capsule (0.4 mg total) by mouth daily.   vitamin C 1000 MG tablet Take 1,000 mg by mouth daily.   Xtandi 40 MG capsule Generic drug: enzalutamide TAKE 2 CAPSULES (80 MG  TOTAL) BY MOUTH DAILY.   zinc gluconate 50 MG tablet Take 50 mg by mouth daily.       Allergies: No Known Allergies  Family History  Problem Relation Age of Onset  . Depression Mother   . Dementia Mother   . Anxiety disorder Mother   . Prostate cancer Father        unconfirmed diagnosis    Social History:  reports that he has never smoked. He has never used smokeless tobacco. He reports current alcohol use of about 2.0 standard drinks of alcohol per week. He reports that he does not use drugs.  ROS: A complete review of systems was performed.  All systems are negative except for pertinent findings as noted.  Physical Exam:  Vital signs in last 24 hours: There were no vitals taken for this visit. Constitutional:  Alert and oriented, No acute distress Cardiovascular: Regular rate  Respiratory: Normal respiratory effort Neurologic: Grossly  intact, no focal deficits Psychiatric: Normal mood and affect  I have reviewed prior pt notes  I have reviewed notes from referring/previous physicians  I have reviewed urinalysis results  I have independently reviewed prior imaging  I have reviewed prior PSA results    Impression/Assessment:  PCa - Pt stable and continues under active surveillance. He does have significant psychological issues which he seems to blame mostly on Lupron injections.  Plan:  1. Pt advised regarding surgical castration as an alternative to Lupron. I let him know that this will obviate the need for long-term Lupron injections, it also may decrease the amount of hot flashes he has. However, I let him know that this will not decrease his fatigue or other physical issues associated with low testosterone  2. Pt provided with a behavioral health referral.  3. F/U in 2 months for OV and symptom recheck.   CC: Dr. Allyn Kenner

## 2020-06-22 NOTE — Progress Notes (Signed)
Urological Symptom Review  Patient is experiencing the following symptoms: Get up at night to urinate   Review of Systems  Gastrointestinal (upper)  : Negative for upper GI symptoms  Gastrointestinal (lower) : Negative for lower GI symptoms  Constitutional : Fatigue  Skin: Negative for skin symptoms  Eyes: Negative for eye symptoms  Ear/Nose/Throat : Negative for Ear/Nose/Throat symptoms  Hematologic/Lymphatic: Negative for Hematologic/Lymphatic symptoms  Cardiovascular : Negative for cardiovascular symptoms  Respiratory : Negative for respiratory symptoms  Endocrine: Negative for endocrine symptoms  Musculoskeletal: Negative for musculoskeletal symptoms  Neurological: Negative for neurological symptoms  Psychologic: Depression Anxiety

## 2020-06-24 MED FILL — XTANDI 40 MG CAPSULE: 40 | 30 days supply | Qty: 60 | Fill #0

## 2020-07-05 ENCOUNTER — Encounter (HOSPITAL_COMMUNITY): Payer: Self-pay

## 2020-07-05 NOTE — Progress Notes (Signed)
This RN was notified today that patient's genetic testing lab kit drawn on 06/21/20 was not packaged and shipped per protocol. I have attempted to reach the patient, but unable to do so. VM left asking that the patient return my call.

## 2020-07-09 ENCOUNTER — Telehealth (HOSPITAL_COMMUNITY): Payer: Self-pay | Admitting: Pharmacy Technician

## 2020-07-09 NOTE — Telephone Encounter (Signed)
Oral Oncology Patient Advocate Encounter   Was successful in securing patient an $7,100 grant from Patient [place] [facility]) to provide copayment coverage for Xtandi.  This will keep the out of pocket expense at $0.     I have spoken with the patient.    The billing information is as follows and has been shared with [place].   Member ID: [id number] Group ID: [id number] RxBin: [id number] Dates of Eligibility: 04/10/20 through 07/08/21  Fund:  [employer] [place] Patient [place] Phone [phone] Fax [phone] 07/09/2020 12:58 PM

## 2020-07-19 ENCOUNTER — Other Ambulatory Visit (HOSPITAL_COMMUNITY): Payer: Self-pay | Admitting: Hematology

## 2020-07-19 DIAGNOSIS — C61 Malignant neoplasm of prostate: Secondary | ICD-10-CM

## 2020-07-21 ENCOUNTER — Other Ambulatory Visit (HOSPITAL_COMMUNITY): Payer: Self-pay | Admitting: Oncology

## 2020-07-23 ENCOUNTER — Other Ambulatory Visit (HOSPITAL_COMMUNITY): Payer: Self-pay | Admitting: *Deleted

## 2020-07-26 MED FILL — XTANDI 40 MG CAPSULE: 40 | 30 days supply | Qty: 60 | Fill #0

## 2020-08-09 ENCOUNTER — Other Ambulatory Visit: Payer: Self-pay

## 2020-08-09 ENCOUNTER — Inpatient Hospital Stay (HOSPITAL_COMMUNITY): Payer: Medicare Other | Attending: Hematology

## 2020-08-09 DIAGNOSIS — C61 Malignant neoplasm of prostate: Secondary | ICD-10-CM | POA: Insufficient documentation

## 2020-08-09 DIAGNOSIS — Z79899 Other long term (current) drug therapy: Secondary | ICD-10-CM | POA: Diagnosis not present

## 2020-08-09 DIAGNOSIS — I10 Essential (primary) hypertension: Secondary | ICD-10-CM | POA: Diagnosis not present

## 2020-08-09 DIAGNOSIS — C7951 Secondary malignant neoplasm of bone: Secondary | ICD-10-CM | POA: Insufficient documentation

## 2020-08-09 DIAGNOSIS — C779 Secondary and unspecified malignant neoplasm of lymph node, unspecified: Secondary | ICD-10-CM | POA: Insufficient documentation

## 2020-08-09 LAB — COMPREHENSIVE METABOLIC PANEL
ALT: 17 U/L (ref 0–44)
AST: 23 U/L (ref 15–41)
Albumin: 4.3 g/dL (ref 3.5–5.0)
Alkaline Phosphatase: 54 U/L (ref 38–126)
Anion gap: 9 (ref 5–15)
BUN: 17 mg/dL (ref 8–23)
CO2: 28 mmol/L (ref 22–32)
Calcium: 9.4 mg/dL (ref 8.9–10.3)
Chloride: 100 mmol/L (ref 98–111)
Creatinine, Ser: 0.96 mg/dL (ref 0.61–1.24)
GFR, Estimated: >60 mL/min (ref >60)
Glucose, Bld: 101 mg/dL — ABNORMAL HIGH (ref 70–99)
Potassium: 4.2 mmol/L (ref 3.5–5.1)
Sodium: 137 mmol/L (ref 135–145)
Total Bilirubin: 0.9 mg/dL (ref 0.3–1.2)
Total Protein: 7.1 g/dL (ref 6.5–8.1)

## 2020-08-09 LAB — CBC WITH DIFFERENTIAL/PLATELET
Abs Immature Granulocytes: 0.01 10*3/uL (ref 0.00–0.07)
Basophils Absolute: 0.1 10*3/uL (ref 0.0–0.1)
Basophils Relative: 1 %
Eosinophils Absolute: 0.1 10*3/uL (ref 0.0–0.5)
Eosinophils Relative: 2 %
HCT: 45.9 % (ref 39.0–52.0)
Hemoglobin: 15.2 g/dL (ref 13.0–17.0)
Immature Granulocytes: 0 %
Lymphocytes Relative: 18 %
Lymphs Abs: 0.8 10*3/uL (ref 0.7–4.0)
MCH: 33.1 pg (ref 26.0–34.0)
MCHC: 33.1 g/dL (ref 30.0–36.0)
MCV: 100 fL (ref 80.0–100.0)
Monocytes Absolute: 0.5 10*3/uL (ref 0.1–1.0)
Monocytes Relative: 11 %
Neutro Abs: 3.2 10*3/uL (ref 1.7–7.7)
Neutrophils Relative %: 68 %
Platelets: 273 10*3/uL (ref 150–400)
RBC: 4.59 MIL/uL (ref 4.22–5.81)
RDW: 13.8 % (ref 11.5–15.5)
WBC: 4.7 10*3/uL (ref 4.0–10.5)
nRBC: 0 % (ref 0.0–0.2)

## 2020-08-09 LAB — PSA: Prostatic Specific Antigen: 0.6 ng/mL (ref 0.00–4.00)

## 2020-08-10 LAB — TESTOSTERONE: Testosterone: 19 ng/dL — ABNORMAL LOW (ref 264–916)

## 2020-08-16 ENCOUNTER — Other Ambulatory Visit: Payer: Self-pay

## 2020-08-16 ENCOUNTER — Inpatient Hospital Stay (HOSPITAL_BASED_OUTPATIENT_CLINIC_OR_DEPARTMENT_OTHER): Payer: Medicare Other | Admitting: Hematology

## 2020-08-16 VITALS — BP 166/87 | HR 70 | Temp 96.9°F | Resp 18 | Wt 156.0 lb

## 2020-08-16 DIAGNOSIS — C61 Malignant neoplasm of prostate: Secondary | ICD-10-CM

## 2020-08-16 NOTE — Patient Instructions (Signed)
[facility] at [facility] Discharge Instructions  You were seen today by Dr. Katragadda. He went over your recent results. Dr. Katragadda will see you back in 3 months for labs and follow up.   Thank you for choosing [facility] at [facility] to provide your oncology and hematology care.  To afford each patient quality time with our provider, please arrive at least 15 minutes before your scheduled appointment time.   If you have a lab appointment with the Cancer Center please come in thru the Main Entrance and check in at the main information desk  You need to re-schedule your appointment should you arrive 10 or more minutes late.  We strive to give you quality time with our providers, and arriving late affects you and other patients whose appointments are after yours.  Also, if you no show three or more times for appointments you may be dismissed from the clinic at the providers discretion.     Again, thank you for choosing [facility].  Our hope is that these requests will decrease the amount of time that you wait before being seen by our physicians.       _____________________________________________________________  Should you have questions after your visit to [facility], please contact our office at [phone] between the hours of 8:00 a.m. and 4:30 p.m.  Voicemails left after 4:00 p.m. will not be returned until the following business day.  For prescription refill requests, have your pharmacy contact our office and allow 72 hours.    Cancer Center Support Programs:   > Cancer Support Group  2nd Tuesday of the month 1pm-2pm, Journey Room   
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
## 2020-08-20 ENCOUNTER — Other Ambulatory Visit (HOSPITAL_COMMUNITY): Payer: Self-pay | Admitting: Hematology

## 2020-08-20 DIAGNOSIS — C61 Malignant neoplasm of prostate: Secondary | ICD-10-CM

## 2020-08-24 DIAGNOSIS — Z1379 Encounter for other screening for genetic and chromosomal anomalies: Secondary | ICD-10-CM | POA: Insufficient documentation

## 2020-08-25 MED FILL — XTANDI 40 MG CAPSULE: 40 | 30 days supply | Qty: 60 | Fill #0

## 2020-08-31 ENCOUNTER — Other Ambulatory Visit: Payer: Self-pay

## 2020-08-31 ENCOUNTER — Encounter: Payer: Self-pay | Admitting: Genetic Counselor

## 2020-08-31 ENCOUNTER — Ambulatory Visit (INDEPENDENT_AMBULATORY_CARE_PROVIDER_SITE_OTHER): Payer: Medicare Other | Admitting: Urology

## 2020-08-31 ENCOUNTER — Telehealth: Payer: Self-pay | Admitting: Genetic Counselor

## 2020-08-31 ENCOUNTER — Ambulatory Visit: Payer: Self-pay | Admitting: Genetic Counselor

## 2020-08-31 ENCOUNTER — Encounter: Payer: Self-pay | Admitting: Urology

## 2020-08-31 VITALS — BP 130/77 | HR 76

## 2020-08-31 DIAGNOSIS — R9721 Rising PSA following treatment for malignant neoplasm of prostate: Secondary | ICD-10-CM

## 2020-08-31 DIAGNOSIS — C61 Malignant neoplasm of prostate: Secondary | ICD-10-CM | POA: Diagnosis not present

## 2020-08-31 DIAGNOSIS — Z1379 Encounter for other screening for genetic and chromosomal anomalies: Secondary | ICD-10-CM

## 2020-08-31 LAB — URINALYSIS, ROUTINE W REFLEX MICROSCOPIC
Bilirubin, UA: NEGATIVE
Glucose, UA: NEGATIVE
Ketones, UA: NEGATIVE
Leukocytes,UA: NEGATIVE
Nitrite, UA: NEGATIVE
Protein,UA: NEGATIVE
RBC, UA: NEGATIVE
Specific Gravity, UA: 1.015 (ref 1.005–1.030)
Urobilinogen, Ur: 0.2 mg/dL (ref 0.2–1.0)
pH, UA: 7 (ref 5.0–7.5)

## 2020-08-31 MED ORDER — LEUPROLIDE ACETATE (6 MONTH) 45 MG IM KIT
45.0000 mg | PACK | Freq: Once | INTRAMUSCULAR | Status: AC
  Administered 2020-08-31: 45 mg via INTRAMUSCULAR

## 2020-08-31 NOTE — Telephone Encounter (Signed)
LVM that his genetic test results are available and requested that he call back to discuss them.  

## 2020-08-31 NOTE — Telephone Encounter (Signed)
Revealed genetic test results. 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 Because he has no known family history of colon cancer, no specialized screening is warranted per NCCN guidelines (v1.2021).

## 2020-08-31 NOTE — Progress Notes (Signed)
Urological Symptom Review  Patient is experiencing the following symptoms: Get up at night to urinate   Review of Systems  Gastrointestinal (upper)  : Negative for upper GI symptoms  Gastrointestinal (lower) : Negative for lower GI symptoms  Constitutional : Night Sweats Fatigue  Skin: Negative for skin symptoms  Eyes: Negative for eye symptoms  Ear/Nose/Throat : Negative for Ear/Nose/Throat symptoms  Hematologic/Lymphatic: Negative for Hematologic/Lymphatic symptoms  Cardiovascular : Negative for cardiovascular symptoms  Respiratory : Negative for respiratory symptoms  Endocrine: Negative for endocrine symptoms  Musculoskeletal: Negative for musculoskeletal symptoms  Neurological: Negative for neurological symptoms  Psychologic: Negative for psychiatric symptoms

## 2020-08-31 NOTE — Progress Notes (Signed)
Chief Complaint: Follow-up of prostate cancer  History of Present Illness: Nathan Smith comes in today for follow-up of prostate cancer, continuation of androgen deprivation therapy. Most recent PSA 0.6. T level castrate.  Initially underwent TRUS/Bx 8.1.2014. All biopsies revealed adenocarcinoma, GS 4+3 throughout the prostate. Right seminal vesicle revealed a GS 4+4, left revealed a GS 4+3. He underwent radiographic imaging including a bone scan and CT of the abdomen and pelvis. CT failed to reveal any significant adenopathy or evidence of extra prostatic extension. It was recommended that he get 2 years of androgen deprivation. He received his first [place] on 5.13.2014. He received his 1st 6 month Lupron on 6.13.2014. Because his testosterone was not quite castrate, he was switched from Lupron to [employer]. He received a 6 month injection of that in mid -December, 2014. .   12.11.2018: Elevating trend was PSA, most recently 2.8. He has stable urinary symptomatology. He has been extremely anxious of undergoing a repeat biopsy.   10.6.2020: He returns today for follow-up -- he has been being seen by [facility] for his prostate cancer. He was initiated onADT. Most recent PSA was 5.2. Most recent CT and bone scan apparently showed no signs of metastatic disease (2019) -- he has had no repeat imaging. He is not very happy on lupron -- he is having hot flashes 2x a day. He is due his next 6 mo lupron this November.   12.8.2020: PSA 5.7 on 12.1.2020. He reports having some okay days and some awful days with irritability, low energy, and fatigue -- he attributes this to lupron. Testosterone (12.1) measured 29. He has been having surveillance imaging at [facility]. He is due 6 mo lupron today.  4.27.2021:Most recent PSA 11.1, T level 18.Stable symptomatology. He denies any new bony aches/pain though he does c/o some abdominal aching (he thinks this is mostly dietary). Weight remains stable. He denies any blood per  urine or stool. Stable bowel pattern.  7.2021--received 4-month Eligard.  11.16.2021: Pt started on Xtandi by Dr. Delton Coombes and reports that he is tolerating this side-effect profile better than Zytiga. He expresses concern regarding a perceived psychological impact with Lupron injections. Pt notes that his hot flashes have recently improved during the last couple of weeks. He is happy that his PSA has been trending downward. At his last visit I suggested psychiatric consultation. Order was put in for this, but it was never carried out   1.25.2022: Most recent PSA is 0.6, testosterone level under 20. Here for Eligard.  BPH:  He is now off of medical therapy for BPH. He denies significant LUTS. 9.14.2021: Pt notes that discontinuation of his Zytiga & prednisone regimen resulted in increased energy.  1.26.2022: Still on tamsulosin. +/- orthostasis. IPSS 5  QoL score 1  Past Medical History:  Diagnosis Date  . Anxiety   . Bilateral hydronephrosis   . Bladder incontinence    night time,  past 2 months  . BPH (benign prostatic hypertrophy) with urinary obstruction   . Depression   . ED (erectile dysfunction)   . H/O ascites    trace  . Hypertension   . Prostate cancer ([place]) 09/24/12   gleason 4+3=7., & 4+4=8,PSA=67.30, volume=33.7cc    Past Surgical History:  Procedure Laterality Date  . PROSTATE BIOPSY  09/24/12   Adenocarcinoma  . PROSTATE BIOPSY N/A 12/20/2017   Procedure: BIOPSY TRANSRECTAL ULTRASONIC PROSTATE (TUBP);  Surgeon: Franchot Gallo, MD;  Location: [facility];  Service: Urology;  Laterality: N/A;  . TONSILLECTOMY  Home Medications:  Allergies as of 08/31/2020   No Known Allergies     Medication List       Accurate as of August 31, 2020  1:11 PM. If you have any questions, ask your nurse or doctor.        Align 4 MG Caps Take 1 capsule by mouth daily.   b complex vitamins capsule Take 1 capsule by mouth daily.   Cholecalciferol 125 MCG (5000  UT) capsule Take 5,000 Units by mouth daily.   Curcumin 95 500 MG Caps Generic drug: Turmeric Take 1 capsule by mouth daily.   Ginseng 100 MG Caps Take by mouth daily.   glucosamine-chondroitin 500-400 MG tablet Take 1 tablet by mouth daily.   L-ARGININE PO Take 1,000 mg by mouth daily.   losartan 25 MG tablet Commonly known as: COZAAR Take 1 tablet (25 mg total) by mouth daily.   melatonin 1 MG Tabs tablet Take 2 mg by mouth at bedtime.   methylphenidate 5 MG tablet Commonly known as: Ritalin Take 0.5 tablets (2.5 mg total) by mouth daily as needed.   Probiotic 1-250 BILLION-MG Caps Take by mouth daily.   tamsulosin 0.4 MG Caps capsule Commonly known as: FLOMAX Take 1 capsule (0.4 mg total) by mouth daily.   vitamin C 1000 MG tablet Take 1,000 mg by mouth daily.   Xtandi 40 MG capsule Generic drug: enzalutamide TAKE 2 CAPSULES (80 MG TOTAL) BY MOUTH DAILY.   zinc gluconate 50 MG tablet Take 50 mg by mouth daily.       Allergies: No Known Allergies  Family History  Problem Relation Age of Onset  . Depression Mother   . Dementia Mother   . Anxiety disorder Mother   . Prostate cancer Father        unconfirmed diagnosis    Social History:  reports that he has never smoked. He has never used smokeless tobacco. He reports current alcohol use of about 2.0 standard drinks of alcohol per week. He reports that he does not use drugs.  ROS: A complete review of systems was performed.  All systems are negative except for pertinent findings as noted.  Physical Exam:  Vital signs in last 24 hours: There were no vitals taken for this visit. Constitutional:  Alert and oriented, No acute distress Cardiovascular: Regular rate  Respiratory: Normal respiratory effort GI: Abdomen is soft, nontender, nondistended, no abdominal masses. No CVAT.  Genitourinary: Normal male phallus, testes are descended bilaterally and non-tender and without masses, scrotum is normal in  appearance without lesions or masses, perineum is normal on inspection. Lymphatic: No lymphadenopathy Neurologic: Grossly intact, no focal deficits Psychiatric: Normal mood and affect  I have reviewed prior pt notes  I have reviewed notes from referring/previous physicians  I have reviewed urinalysis results  I have independently reviewed prior imaging  I have reviewed prior PSA/testosterone results   Impression/Assessment: PCa--recurrence following definitive/curative Rx--now on Xtandi--excellent PSA response Plan:  6 mo Eligard today  OV 3 mos for routine check

## 2020-09-03 ENCOUNTER — Encounter: Payer: Self-pay | Admitting: Genetic Counselor

## 2020-09-03 NOTE — Progress Notes (Signed)
GENETIC TEST RESULTS   Patient Name: Nathan Smith Patient Age: 70 y.o. Encounter Date: 08/31/2020  REFERRING PROVIDER: Derek Jack, MD [address] [place],  [place] [address]   HPI: Nathan Smith was previously seen in the [facility] due to a personal history of cancer and concerns regarding a hereditary predisposition to cancer. Please refer to our prior cancer genetics clinic note for more information regarding Nathan Smith medical, social and family histories, and our assessment and recommendations, at the time. Nathan Smith recent genetic test results were disclosed to him, as were recommendations warranted by these results. These results and recommendations are discussed in more detail below.   FAMILY HISTORY:  We obtained a detailed, 4-generation family history.  Significant diagnoses are listed below: Family History  Problem Relation Age of Onset  . Depression Mother   . Dementia Mother   . Anxiety disorder Mother   . Prostate cancer Father        unconfirmed diagnosis    Nathan Smith does not have children. He has one paternal half-sister who is 4.   Nathan Smith mother died at the age of 27. He had one maternal uncle and two maternal aunts. His maternal grandparents died in their 77s. There are no known diagnoses of cancer on the maternal side of the family.  Nathan Smith father died at the age of 8 and may have had prostate cancer, although this is unconfirmed. He had three paternal aunts. His paternal grandparents died around the age of 67. There are no other known diagnoses of cancer on the paternal side of the family.  Nathan Smith is unaware of previous family history of genetic testing for hereditary cancer risks. Patient's ancestors are of [place] and [place] descent. There is no reported Ashkenazi Jewish ancestry. There is no known consanguinity.  GENETIC TEST RESULTS:  Genetic testing reported out on 08/24/2020 through the  Common Hereditary Cancers panel and Prostate Cancer HRR panel offered by [facility] laboratories. 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   Pathogenic variants in the MUTYH gene are associated with autosomal recessvie MUTYH-associated polyposis (MAP). 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   The Common Hereditary Cancers Panel offered by [facility] includes sequencing and/or deletion duplication testing of the following 47 genes: APC, ATM, AXIN2, BARD1, BMPR1A, BRCA1, BRCA2, BRIP1, CDH1, CDK4, CDKN2A (p14ARF), CDKN2A (p16INK4a), CHEK2, CTNNA1, DICER1, EPCAM (Deletion/duplication testing only), GREM1 (promoter region deletion/duplication testing only), KIT, MEN1, MLH1, MSH2, MSH3, MSH6, MUTYH, NBN, NF1, NTHL1, PALB2, PDGFRA, PMS2, POLD1, POLE, PTEN, RAD50, RAD51C, RAD51D, SDHB, SDHC, SDHD, SMAD4, SMARCA4. STK11, TP53, TSC1, TSC2, and VHL. The following genes were evaluated for sequence changes only: SDHA and HOXB13 c.251G>A variant only. The Prostate Cancer HRR Panel offered by [facility] includes sequencing and/or deletion/duplication analysis of the following 10 genes: ATM, BARD1, BRCA1, BRCA2, BRIP1, CHEK2, FANCL, PALB2, RAD51C, and RAD51D. A copy of the test report will be scanned into Epic and located under the Molecular Pathology section of the Results Review tab.  A portion of the result report is included below for reference.    Nathan Smith genetic test results do not explain why he has a personal history of cancer. We discussed [place] that because current genetic testing is not perfect, it is possible there may be a gene mutation in one of these genes that current testing cannot detect, but  that chance is small. We also discussed that there could be another gene that has not yet been discovered, or that we have not yet tested, that is responsible for  the cancer diagnoses in the family. Therefore, it is important to remain in touch with cancer genetics in the future so that we can continue to offer Nathan Smith the most up to date genetic testing.  DISCUSSION OF RESULTS & RECOMMENDATIONS:  We discussed the implications of a heterozygous MUTYH mutation with Nathan Smith, and discussed who else in the family should have genetic testing. We recommended Nathan Smith follow the most updated medical management guidelines (NCCN Guidelines Genetic/Familial High Risk Assessment: Colorectal v1.2021) for heterozygous MUTYH mutations; all of which are outlined below. These can be coordinated by Nathan Smith GI doctor or primary care provider, as applicable. Because Nathan Smith does not have a personal or family history of colon cancer, no specialized colon cancer screening is warranted based on this genetic test result.     For individuals unaffected with colorectal cancer with a fist-degree relative with colorectal cancer: High-quality colonoscopy every 5 years starting at age 41 or 5 years younger than the earliest age of colorectal cancer onset, whichever is younger.  For individuals with a second-degree relative with colorectal cancer: There are no specific data available to determine screening recommendations for a patient with an MUTYH heterozygous pathogenic variant and a second-degree relative affected with colorectal cancer.  For individuals unaffected by colorectal cancer with NO family history of colorectal cancer: Data is uncertain on whether specialized screening is warranted.   Although heterozygous MUTYH mutation carriers may have a slightly increased risk for colon cancer, this result does not explain Nathan Smith personal or family history. Most cancers happen by chance and his test result suggests that his cancer may fall into this category.    An individual's cancer risk and medical management are not determined by genetic test results  alone. Overall cancer risk assessment incorporates additional factors, including personal medical history, family history, and any available genetic information that may result in a personalized plan for cancer prevention and surveillance.   FAMILY MEMBERS: Since we now know the MUTYH mutation found on Mr. Plascencia test, we can test at-risk relatives to determine whether or not they have inherited the mutation and have an increased chance to have a child with MUTYH-associated polyposis. We will be happy to meet with any of the family members or refer them to a genetic counselor in their local area. To locate genetic counselors in other cities, individuals can visit the website "[email or web address]" and search for a cancer genetic counselor by zip code.    We encouraged Mr. Canal to remain in contact with [place] in cancer genetics on an annual basis so we can update Mr. Joines personal and family histories, and inform him of advances in cancer genetics that may be of benefit for the entire family. Mr. Kon is welcome to call with any questions or concerns, at any time.    Clint Guy, [place], [facility] Licensed, Certified Dispensing [email or web address] Phone: [phone]

## 2020-09-20 MED FILL — XTANDI 40 MG CAPSULE: 40 | 30 days supply | Qty: 60 | Fill #1

## 2020-10-26 MED FILL — XTANDI 40 MG CAPSULE: 40 | 30 days supply | Qty: 60 | Fill #2

## 2020-11-02 ENCOUNTER — Other Ambulatory Visit (HOSPITAL_COMMUNITY): Payer: Self-pay

## 2020-11-22 ENCOUNTER — Inpatient Hospital Stay (HOSPITAL_COMMUNITY): Payer: Medicare Other | Attending: Hematology

## 2020-11-22 ENCOUNTER — Other Ambulatory Visit: Payer: Self-pay

## 2020-11-22 DIAGNOSIS — C61 Malignant neoplasm of prostate: Secondary | ICD-10-CM | POA: Diagnosis present

## 2020-11-22 DIAGNOSIS — I10 Essential (primary) hypertension: Secondary | ICD-10-CM | POA: Diagnosis not present

## 2020-11-22 DIAGNOSIS — Z79899 Other long term (current) drug therapy: Secondary | ICD-10-CM | POA: Insufficient documentation

## 2020-11-22 DIAGNOSIS — C7951 Secondary malignant neoplasm of bone: Secondary | ICD-10-CM | POA: Insufficient documentation

## 2020-11-22 DIAGNOSIS — C779 Secondary and unspecified malignant neoplasm of lymph node, unspecified: Secondary | ICD-10-CM | POA: Insufficient documentation

## 2020-11-22 LAB — CBC WITH DIFFERENTIAL/PLATELET
Abs Immature Granulocytes: 0.02 10*3/uL (ref 0.00–0.07)
Basophils Absolute: 0.1 10*3/uL (ref 0.0–0.1)
Basophils Relative: 1 %
Eosinophils Absolute: 0.2 10*3/uL (ref 0.0–0.5)
Eosinophils Relative: 3 %
HCT: 46.4 % (ref 39.0–52.0)
Hemoglobin: 15.1 g/dL (ref 13.0–17.0)
Immature Granulocytes: 0 %
Lymphocytes Relative: 14 %
Lymphs Abs: 0.9 10*3/uL (ref 0.7–4.0)
MCH: 33.3 pg (ref 26.0–34.0)
MCHC: 32.5 g/dL (ref 30.0–36.0)
MCV: 102.2 fL — ABNORMAL HIGH (ref 80.0–100.0)
Monocytes Absolute: 0.6 10*3/uL (ref 0.1–1.0)
Monocytes Relative: 10 %
Neutro Abs: 4.2 10*3/uL (ref 1.7–7.7)
Neutrophils Relative %: 72 %
Platelets: 278 10*3/uL (ref 150–400)
RBC: 4.54 MIL/uL (ref 4.22–5.81)
RDW: 13.4 % (ref 11.5–15.5)
WBC: 5.9 10*3/uL (ref 4.0–10.5)
nRBC: 0 % (ref 0.0–0.2)

## 2020-11-22 LAB — COMPREHENSIVE METABOLIC PANEL
ALT: 14 U/L (ref 0–44)
AST: 23 U/L (ref 15–41)
Albumin: 4.2 g/dL (ref 3.5–5.0)
Alkaline Phosphatase: 57 U/L (ref 38–126)
Anion gap: 10 (ref 5–15)
BUN: 16 mg/dL (ref 8–23)
CO2: 28 mmol/L (ref 22–32)
Calcium: 9.5 mg/dL (ref 8.9–10.3)
Chloride: 101 mmol/L (ref 98–111)
Creatinine, Ser: 1 mg/dL (ref 0.61–1.24)
GFR, Estimated: >60 mL/min (ref >60)
Glucose, Bld: 99 mg/dL (ref 70–99)
Potassium: 4.3 mmol/L (ref 3.5–5.1)
Sodium: 139 mmol/L (ref 135–145)
Total Bilirubin: 0.8 mg/dL (ref 0.3–1.2)
Total Protein: 7.2 g/dL (ref 6.5–8.1)

## 2020-11-22 LAB — PSA: Prostatic Specific Antigen: 0.35 ng/mL (ref 0.00–4.00)

## 2020-11-23 ENCOUNTER — Other Ambulatory Visit (HOSPITAL_COMMUNITY): Payer: Self-pay | Admitting: Hematology

## 2020-11-23 ENCOUNTER — Other Ambulatory Visit (HOSPITAL_COMMUNITY): Payer: Self-pay

## 2020-11-23 DIAGNOSIS — C61 Malignant neoplasm of prostate: Secondary | ICD-10-CM

## 2020-11-23 LAB — TESTOSTERONE: Testosterone: 17 ng/dL — ABNORMAL LOW (ref 264–916)

## 2020-11-23 MED ORDER — XTANDI 40 MG PO CAPS
ORAL_CAPSULE | ORAL | 2 refills | Status: DC
  Filled 2020-11-23: qty 60, 30d supply, fill #0
  Filled 2020-12-28: qty 60, 30d supply, fill #1
  Filled 2021-01-26: qty 60, 30d supply, fill #2

## 2020-11-24 ENCOUNTER — Other Ambulatory Visit (HOSPITAL_COMMUNITY): Payer: Self-pay

## 2020-11-25 ENCOUNTER — Other Ambulatory Visit (HOSPITAL_COMMUNITY): Payer: Self-pay

## 2020-11-29 ENCOUNTER — Inpatient Hospital Stay (HOSPITAL_COMMUNITY): Payer: Medicare Other | Admitting: Hematology

## 2020-11-30 ENCOUNTER — Other Ambulatory Visit: Payer: Self-pay

## 2020-11-30 ENCOUNTER — Encounter: Payer: Self-pay | Admitting: Urology

## 2020-11-30 ENCOUNTER — Ambulatory Visit (INDEPENDENT_AMBULATORY_CARE_PROVIDER_SITE_OTHER): Payer: Medicare Other | Admitting: Urology

## 2020-11-30 VITALS — BP 142/81 | HR 82 | Temp 98.3°F | Ht 72.0 in | Wt 155.0 lb

## 2020-11-30 DIAGNOSIS — Z79899 Other long term (current) drug therapy: Secondary | ICD-10-CM

## 2020-11-30 DIAGNOSIS — K4090 Unilateral inguinal hernia, without obstruction or gangrene, not specified as recurrent: Secondary | ICD-10-CM

## 2020-11-30 DIAGNOSIS — C61 Malignant neoplasm of prostate: Secondary | ICD-10-CM | POA: Diagnosis not present

## 2020-11-30 DIAGNOSIS — R9721 Rising PSA following treatment for malignant neoplasm of prostate: Secondary | ICD-10-CM | POA: Diagnosis not present

## 2020-11-30 LAB — URINALYSIS, ROUTINE W REFLEX MICROSCOPIC
Bilirubin, UA: NEGATIVE
Glucose, UA: NEGATIVE
Ketones, UA: NEGATIVE
Leukocytes,UA: NEGATIVE
Nitrite, UA: NEGATIVE
Protein,UA: NEGATIVE
RBC, UA: NEGATIVE
Specific Gravity, UA: 1.015 (ref 1.005–1.030)
Urobilinogen, Ur: 0.2 mg/dL (ref 0.2–1.0)
pH, UA: 7 (ref 5.0–7.5)

## 2020-11-30 NOTE — Progress Notes (Signed)
History of Present Illness: Nathan Smith presents at this point for follow-up of prostate cancer.  Initially underwent TRUS/Bx 8.1.2014. All biopsies revealed adenocarcinoma, GS 4+3 throughout the prostate. Right seminal vesicle revealed a GS 4+4, left revealed a GS 4+3. He underwent radiographic imaging including a bone scan and CT of the abdomen and pelvis. CT failed to reveal any significant adenopathy or evidence of extra prostatic extension. It was recommended that he get 2 years of androgen deprivation. He received his first [place] on 5.13.2014. He received his 1st 6 month Lupron on 6.13.2014. Because his testosterone was not quite castrate, he was switched from Lupron to [employer]. He received a 6 month injection of that in mid -December, 2014. .   12.11.2018:Elevating trend was PSA, most recently 2.8. He has stable urinary symptomatology. He has been extremely anxious of undergoing a repeat biopsy.   10.6.2020:He returns today for follow-up -- he has been being seen by [facility] for his prostate cancer. He was initiated onADT. Most recent PSA was 5.2. Most recent CT and bone scan apparently showed no signs of metastatic disease (2019) -- he has had no repeat imaging. He is not very happy on lupron -- he is having hot flashes 2x a day. He is due his next 6 mo lupron this November.   12.8.2020:PSA 5.7 on 12.1.2020. He reports having some okay days and some awful days with irritability, low energy, and fatigue -- he attributes this to lupron. Testosterone (12.1) measured 29. He has been having surveillance imaging at [facility]. He is due 6 mo lupron today.  4.27.2021:Most recent PSA 11.1, T level 18.Stable symptomatology. He denies any new bony aches/pain though he does c/o some abdominal aching (he thinks this is mostly dietary). Weight remains stable. He denies any blood per urine or stool. Stable bowel pattern.  7.2021--received 56-month Eligard.  11.16.2021:Pt started on Xtandi by Dr. Delton Coombes  and reports that he is tolerating this side-effect profile better than Zytiga. He expresses concern regarding a perceived psychological impact with Lupron injections. Pt notes that his hot flashes have recently improved during the last couple of weeks. He is happy that his PSA has been trending downward. At his last visit I suggested psychiatric consultation. Order was put in for this, but it was never carried out   1.25.2022: Most recent PSA is 0.6, testosterone level under 20.   57-month Eligard administered  4.26.2022: Testosterone level 17, PSA 0.35. Stable voiding sx's.  Having no bony pain.  Appetite good, weight is stable.  Still having some hot flashes but not that bothersome.  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  IPSS Questionnaire (AUA-7): Over the past month.   1)  How often have you had a sensation of not emptying your bladder completely after you finish urinating?  1 - Less than 1 time in 5  2)  How often have you had to urinate again less than two hours after you finished urinating? 1 - Less than 1 time in 5  3)  How often have you found you stopped and started again several times when you urinated?  1 - Less than 1 time in 5  4) How difficult have you found it to postpone urination?  1 - Less than 1 time in 5  5) How often have you had a weak urinary stream?  0 - Not at all  6) How often have you had to push or strain to begin urination?  0 -  Not at all  7) How many times did you most typically get up to urinate from the time you went to bed until the time you got up in the morning?  2 - 2 times  Total score:  0-7 mildly symptomatic   8-19 moderately symptomatic   20-35 severely symptomatic     Past Medical History:  Diagnosis Date  . Anxiety   . Bilateral hydronephrosis   . Bladder incontinence    night time,  past 2 months  . BPH (benign prostatic hypertrophy) with urinary obstruction   . Depression   . ED (erectile dysfunction)    . H/O ascites    trace  . Hypertension   . Prostate cancer ([place]) 09/24/12   gleason 4+3=7., & 4+4=8,PSA=67.30, volume=33.7cc    Past Surgical History:  Procedure Laterality Date  . PROSTATE BIOPSY  09/24/12   Adenocarcinoma  . PROSTATE BIOPSY N/A 12/20/2017   Procedure: BIOPSY TRANSRECTAL ULTRASONIC PROSTATE (TUBP);  Surgeon: Franchot Gallo, MD;  Location: [facility];  Service: Urology;  Laterality: N/A;  . TONSILLECTOMY      Home Medications:  Allergies as of 11/30/2020   No Known Allergies     Medication List       Accurate as of November 30, 2020 12:22 PM. If you have any questions, ask your nurse or doctor.        Align 4 MG Caps Take 1 capsule by mouth daily.   b complex vitamins capsule Take 1 capsule by mouth daily.   Cholecalciferol 125 MCG (5000 UT) capsule Take 5,000 Units by mouth daily.   Ginseng 100 MG Caps Take by mouth daily.   glucosamine-chondroitin 500-400 MG tablet Take 1 tablet by mouth daily.   L-ARGININE PO Take 1,000 mg by mouth daily.   losartan 25 MG tablet Commonly known as: COZAAR Take 1 tablet (25 mg total) by mouth daily.   melatonin 1 MG Tabs tablet Take 2 mg by mouth at bedtime.   methylphenidate 5 MG tablet Commonly known as: Ritalin Take 0.5 tablets (2.5 mg total) by mouth daily as needed.   Probiotic 1-250 BILLION-MG Caps Take by mouth daily.   tamsulosin 0.4 MG Caps capsule Commonly known as: FLOMAX Take 1 capsule (0.4 mg total) by mouth daily.   Turmeric 500 MG Caps Take 1 capsule by mouth daily.   vitamin C 1000 MG tablet Take 1,000 mg by mouth daily.   Xtandi 40 MG capsule Generic drug: enzalutamide TAKE 2 CAPSULES (80 MG TOTAL) BY MOUTH DAILY.   zinc gluconate 50 MG tablet Take 50 mg by mouth daily.       Allergies: No Known Allergies  Family History  Problem Relation Age of Onset  . Depression Mother   . Dementia Mother   . Anxiety disorder Mother   . Prostate cancer Father        unconfirmed  diagnosis    Social History:  reports that he has never smoked. He has never used smokeless tobacco. He reports current alcohol use of about 2.0 standard drinks of alcohol per week. He reports that he does not use drugs.  ROS: A complete review of systems was performed.  All systems are negative except for pertinent findings as noted.  Physical Exam:  Vital signs in last 24 hours: There were no vitals taken for this visit. Constitutional:  Alert and oriented, No acute distress Cardiovascular: Regular rate  Respiratory: Normal respiratory effort GI: Abdomen is soft, nontender, nondistended, no abdominal masses. No CVAT.  There is a reducible right inguinal hernia descended down into his scrotum Lymphatic: No lymphadenopathy Neurologic: Grossly intact, no focal deficits Psychiatric: Normal mood and affect  I have reviewed prior pt notes  I have reviewed urinalysis results  I have independently reviewed prior imaging  I have reviewed prior PSA/testosterone results    Impression/Assessment:  Metastatic castrate resistant prostate cancer, on second line therapy with Xtandi with excellent, durable response.  He tolerates this well  Right inguinal hernia, moderately bothersome  Plan:  1.  I gave him the name of a general surgeon in [place] should he need his hernia fixed-Dr. Rosendo Gros  2.  I will see him back in 3 months for routine check as well as to administer leuprolide

## 2020-11-30 NOTE — Progress Notes (Signed)
Urological Symptom Review  Patient is experiencing the following symptoms: Get up at night to urinate   Review of Systems  Gastrointestinal (upper)  : Negative for upper GI symptoms  Gastrointestinal (lower) : Negative for lower GI symptoms  Constitutional : Night Sweats  Skin: Negative for skin symptoms  Eyes: Negative for eye symptoms  Ear/Nose/Throat : Negative for Ear/Nose/Throat symptoms  Hematologic/Lymphatic: Negative for Hematologic/Lymphatic symptoms  Cardiovascular : Negative for cardiovascular symptoms  Respiratory : Negative for respiratory symptoms  Endocrine: Negative for endocrine symptoms  Musculoskeletal: Negative for musculoskeletal symptoms  Neurological: Negative for neurological symptoms  Psychologic: Anxiety

## 2020-12-22 ENCOUNTER — Other Ambulatory Visit (HOSPITAL_COMMUNITY): Payer: Self-pay
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
## 2020-12-23 ENCOUNTER — Ambulatory Visit (HOSPITAL_COMMUNITY): Payer: Medicare Other | Admitting: Hematology

## 2020-12-23 DIAGNOSIS — C61 Malignant neoplasm of prostate: Secondary | ICD-10-CM

## 2020-12-24 ENCOUNTER — Other Ambulatory Visit (HOSPITAL_COMMUNITY): Payer: Self-pay

## 2020-12-28 ENCOUNTER — Other Ambulatory Visit (HOSPITAL_COMMUNITY): Payer: Self-pay

## 2021-01-04 ENCOUNTER — Other Ambulatory Visit: Payer: Self-pay

## 2021-01-04 ENCOUNTER — Inpatient Hospital Stay (HOSPITAL_COMMUNITY): Payer: Medicare Other | Attending: Hematology

## 2021-01-04 ENCOUNTER — Other Ambulatory Visit (HOSPITAL_COMMUNITY): Payer: Self-pay | Admitting: Surgery

## 2021-01-04 DIAGNOSIS — C61 Malignant neoplasm of prostate: Secondary | ICD-10-CM | POA: Insufficient documentation

## 2021-01-04 LAB — COMPREHENSIVE METABOLIC PANEL
ALT: 14 U/L (ref 0–44)
AST: 22 U/L (ref 15–41)
Albumin: 4.1 g/dL (ref 3.5–5.0)
Alkaline Phosphatase: 57 U/L (ref 38–126)
Anion gap: 7 (ref 5–15)
BUN: 14 mg/dL (ref 8–23)
CO2: 27 mmol/L (ref 22–32)
Calcium: 8.9 mg/dL (ref 8.9–10.3)
Chloride: 100 mmol/L (ref 98–111)
Creatinine, Ser: 0.98 mg/dL (ref 0.61–1.24)
GFR, Estimated: >60 mL/min (ref >60)
Glucose, Bld: 110 mg/dL — ABNORMAL HIGH (ref 70–99)
Potassium: 4.2 mmol/L (ref 3.5–5.1)
Sodium: 134 mmol/L — ABNORMAL LOW (ref 135–145)
Total Bilirubin: 0.9 mg/dL (ref 0.3–1.2)
Total Protein: 7 g/dL (ref 6.5–8.1)

## 2021-01-04 LAB — PSA: Prostatic Specific Antigen: 0.33 ng/mL (ref 0.00–4.00)
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
## 2021-01-05 ENCOUNTER — Inpatient Hospital Stay (HOSPITAL_COMMUNITY): Payer: Medicare Other | Attending: Hematology | Admitting: Hematology

## 2021-01-05 VITALS — BP 150/74 | HR 68 | Temp 97.2°F | Resp 18 | Wt 156.6 lb

## 2021-01-05 DIAGNOSIS — C61 Malignant neoplasm of prostate: Secondary | ICD-10-CM | POA: Diagnosis present

## 2021-01-05 DIAGNOSIS — C7951 Secondary malignant neoplasm of bone: Secondary | ICD-10-CM | POA: Diagnosis present

## 2021-01-05 DIAGNOSIS — I10 Essential (primary) hypertension: Secondary | ICD-10-CM | POA: Diagnosis not present

## 2021-01-05 DIAGNOSIS — C779 Secondary and unspecified malignant neoplasm of lymph node, unspecified: Secondary | ICD-10-CM | POA: Insufficient documentation

## 2021-01-05 NOTE — Patient Instructions (Signed)
[facility] at [facility] Discharge Instructions  You were seen today by Dr. Katragadda. He went over your recent results. Dr. Katragadda will see you back in 2 months for labs and follow up.   Thank you for choosing [facility] at [facility] to provide your oncology and hematology care.  To afford each patient quality time with our provider, please arrive at least 15 minutes before your scheduled appointment time.   If you have a lab appointment with the Cancer Center please come in thru the Main Entrance and check in at the main information desk  You need to re-schedule your appointment should you arrive 10 or more minutes late.  We strive to give you quality time with our providers, and arriving late affects you and other patients whose appointments are after yours.  Also, if you no show three or more times for appointments you may be dismissed from the clinic at the providers discretion.     Again, thank you for choosing [facility].  Our hope is that these requests will decrease the amount of time that you wait before being seen by our physicians.       _____________________________________________________________  Should you have questions after your visit to [facility], please contact our office at [phone] between the hours of 8:00 a.m. and 4:30 p.m.  Voicemails left after 4:00 p.m. will not be returned until the following business day.  For prescription refill requests, have your pharmacy contact our office and allow 72 hours.    Cancer Center Support Programs:   > Cancer Support Group  2nd Tuesday of the month 1pm-2pm, Journey Room   

## 2021-01-06 ENCOUNTER — Encounter (HOSPITAL_COMMUNITY): Payer: Self-pay | Admitting: *Deleted

## 2021-01-11 ENCOUNTER — Encounter (HOSPITAL_COMMUNITY): Payer: Self-pay | Admitting: General Practice

## 2021-01-11 NOTE — Progress Notes (Addendum)
[place] CSW Progress Notes  Staff message from [place] received while undersigned provider was on PAL.  Patient had expressed suicidal ideation w no plan and ongoing depression.  MD made aware of these issues during the visit - patient declined behavioral health referral.  CSW tried to contact patient, left VM w my contact information and encouragement to call back as patient was unavailable.  Patient returned my call.  He is struggling w prolonged treatment for prostate cancer - has been in treatment approx 8 years.  He deals with side effects from drugs which cause hormonal mood swings and fatigue, has preexisting depression and anxiety.  Lives alone, feels somewhat isolated as result of his living situation.  Discussed options for seeking increased support, he has received referral from his urologist for supportive counseling and is considering pursuing this option.  He knows to call us w any needs/concerns that we can assist with.  Edwyna Shell, LCSW Clinical Social Worker Phone:  [phone]

## 2021-01-18 ENCOUNTER — Other Ambulatory Visit (HOSPITAL_COMMUNITY): Payer: Self-pay

## 2021-01-19 ENCOUNTER — Other Ambulatory Visit (HOSPITAL_COMMUNITY): Payer: Self-pay

## 2021-01-20 ENCOUNTER — Other Ambulatory Visit (HOSPITAL_COMMUNITY): Payer: Self-pay

## 2021-01-26 ENCOUNTER — Other Ambulatory Visit (HOSPITAL_COMMUNITY): Payer: Self-pay

## 2021-02-01 ENCOUNTER — Other Ambulatory Visit (HOSPITAL_COMMUNITY): Payer: Self-pay

## 2021-02-18 ENCOUNTER — Other Ambulatory Visit (HOSPITAL_COMMUNITY): Payer: Self-pay

## 2021-02-18 ENCOUNTER — Other Ambulatory Visit (HOSPITAL_COMMUNITY): Payer: Self-pay | Admitting: Hematology

## 2021-02-18 DIAGNOSIS — C61 Malignant neoplasm of prostate: Secondary | ICD-10-CM

## 2021-02-19 ENCOUNTER — Other Ambulatory Visit (HOSPITAL_COMMUNITY): Payer: Self-pay | Admitting: Hematology

## 2021-02-19 DIAGNOSIS — C61 Malignant neoplasm of prostate: Secondary | ICD-10-CM

## 2021-02-21 ENCOUNTER — Other Ambulatory Visit (HOSPITAL_COMMUNITY): Payer: Self-pay

## 2021-02-21 MED ORDER — XTANDI 40 MG PO CAPS
ORAL_CAPSULE | ORAL | 2 refills | Status: DC
  Filled 2021-02-21: qty 60, 30d supply, fill #0
  Filled 2021-03-23: qty 60, 30d supply, fill #1
  Filled 2021-04-27: qty 60, 30d supply, fill #2

## 2021-02-21 NOTE — Telephone Encounter (Signed)
Chart reviewed. Patient's Xtandi refilled per last office note with Dr. Delton Coombes

## 2021-02-22 ENCOUNTER — Other Ambulatory Visit (HOSPITAL_COMMUNITY): Payer: Self-pay

## 2021-02-27 NOTE — Progress Notes (Signed)
History of Present Illness: Nathan Smith presents for follow-up of recurrent prostate cancer.  Initially underwent TRUS/Bx 8.1.2014. All biopsies revealed adenocarcinoma, GS 4+3 throughout the prostate. Right seminal vesicle revealed a GS 4+4, left revealed a GS 4+3. He underwent radiographic imaging including a bone scan and CT of the abdomen and pelvis. CT failed to reveal any significant adenopathy or evidence of extra prostatic extension. It was recommended that he get 2 years of androgen deprivation. He received his first [place] on 5.13.2014. He received his 1st 6 month Lupron on 6.13.2014. Because his testosterone was not quite castrate, he was switched from Lupron to [employer]. He received a 6 month injection of that in mid -December, 2014. .   12.11.2018: Elevating trend was PSA, most recently 2.8. He has stable urinary symptomatology. He has been extremely anxious of undergoing a repeat biopsy.   10.6.2020: He returns today for follow-up -- he has been being seen by [facility] for his prostate cancer. He was initiated onADT. Most recent PSA was 5.2. Most recent CT and bone scan apparently showed no signs of metastatic disease (2019) -- he has had no repeat imaging. He is not very happy on lupron -- he is having hot flashes 2x a day. He is due his next 6 mo lupron this November.   12.8.2020: PSA 5.7 on 12.1.2020. He reports having some okay days and some awful days with irritability, low energy, and fatigue -- he attributes this to lupron. Testosterone (12.1) measured 29. He has been having surveillance [facility] at [facility]. He is due 6 mo lupron today.    4.27.2021: Most recent PSA 11.1, T level 18. Stable symptomatology. He denies any new bony aches/pain though he does c/o some abdominal aching (he thinks this is mostly dietary). Weight remains stable. He denies any blood per urine or stool. Stable bowel pattern.  5.19.2021: PSMA PET--1. T11 osseous metastasis. 2. Asymmetric hypermetabolism within the  prostate, suspicious for residual disease. 3. Mild enlargement of low abdominal retroperitoneal nodes which demonstrate low-level hypermetabolism, suspicious for nodal metastasis. 4. Incidental findings, including: Coronary artery atherosclerosis. Aortic Atherosclerosis (ICD10-I70.0).   7.2021--received 86-monthEligard.   11.16.2021: Pt started on Xtandi by Dr. KDelton Coombesand reports that he is tolerating this side-effect profile better than Zytiga. He expresses concern regarding a perceived psychological impact with Lupron injections. Pt notes that his hot flashes have recently improved during the last couple of weeks. He is happy that his PSA has been trending downward. At his last visit I suggested psychiatric consultation. Order was put in for this, but it was never carried out    1.25.2022: Most recent PSA is 0.6, testosterone level under 20.   651-monthligard administered   4.26.2022: Testosterone level 17, PSA 0.35. Stable voiding sx's.  Having no bony pain.  Appetite good, weight is stable.  Still having some hot flashes but not that bothersome.  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 Still on Xtandi. Due leuprolide today. IPSS 6  QoL score 0  He still has occasional suicidal ideations.  He has multiple other feelings of low self-worth, frustration, agitation.  We have tried getting him therapy but unfortunately the consultant did not take Medicare.  Past Medical History:  Diagnosis Date   Anxiety    Bilateral hydronephrosis    Bladder incontinence    night time,  past 2 months   BPH (benign prostatic hypertrophy) with urinary obstruction  Depression    ED (erectile dysfunction)    H/O ascites    trace   Hypertension    Prostate cancer ([place]) 09/24/12   gleason 4+3=7., & 4+4=8,PSA=67.30, volume=33.7cc    Past Surgical History:  Procedure Laterality Date   PROSTATE BIOPSY  09/24/12    Adenocarcinoma   PROSTATE BIOPSY N/A 12/20/2017   Procedure: BIOPSY TRANSRECTAL ULTRASONIC PROSTATE (TUBP);  Surgeon: Franchot Gallo, MD;  Location: [facility];  Service: Urology;  Laterality: N/A;   TONSILLECTOMY      Home Medications:  Allergies as of 03/01/2021   No Known Allergies      Medication List        Accurate as of February 27, 2021  1:51 PM. If you have any questions, ask your nurse or doctor.          Align 4 MG Caps Take 1 capsule by mouth daily.   b complex vitamins capsule Take 1 capsule by mouth daily.   Cholecalciferol 125 MCG (5000 UT) capsule Take 5,000 Units by mouth daily.   Ginseng 100 MG Caps Take by mouth daily.   glucosamine-chondroitin 500-400 MG tablet Take 1 tablet by mouth daily.   L-ARGININE PO Take 1,000 mg by mouth daily.   losartan 25 MG tablet Commonly known as: COZAAR TAKE 1 TABLET(25 MG) BY MOUTH DAILY   melatonin 1 MG Tabs tablet Take 2 mg by mouth at bedtime.   methylphenidate 5 MG tablet Commonly known as: Ritalin Take 0.5 tablets (2.5 mg total) by mouth daily as needed.   Probiotic 1-250 BILLION-MG Caps Take by mouth daily.   tamsulosin 0.4 MG Caps capsule Commonly known as: FLOMAX TAKE 1 CAPSULE(0.4 MG) BY MOUTH DAILY   Turmeric 500 MG Caps Take 1 capsule by mouth daily.   vitamin C 1000 MG tablet Take 1,000 mg by mouth daily.   Xtandi 40 MG capsule Generic drug: enzalutamide TAKE 2 CAPSULES (80 MG TOTAL) BY MOUTH DAILY.   zinc gluconate 50 MG tablet Take 50 mg by mouth daily.        Allergies: No Known Allergies  Family History  Problem Relation Age of Onset   Depression Mother    Dementia Mother    Anxiety disorder Mother    Prostate cancer Father        unconfirmed diagnosis    Social History:  reports that he has never smoked. He has never used smokeless tobacco. He reports current alcohol use of about 2.0 standard drinks of alcohol per week. He reports that he does not use drugs.  ROS: A  complete review of systems was performed.  All systems are negative except for pertinent findings as noted.  Physical Exam:  Vital signs in last 24 hours: There were no vitals taken for this visit. Constitutional:  Alert and oriented, No acute distress Cardiovascular: Regular rate  Respiratory: Normal respiratory effort GI: Abdomen is soft, nontender, nondistended, no abdominal masses. No CVAT.  Large reducible right inguinal hernia. Genitourinary: Normal male phallus, testes are descended bilaterally and non-tender and without masses, scrotum is normal in appearance without lesions or masses, perineum is normal on inspection.  Prostate flat, soft, nonnodular.  Approximately 80 g. Lymphatic: No lymphadenopathy Neurologic: Grossly intact, no focal deficits Psychiatric: Normal mood and affect  I have reviewed prior pt notes  I have reviewed urinalysis results  I have independently reviewed prior imaging  I reviewed consulting MD notes  I have reviewed prior PSA results   Impression/Assessment:  Metastatic castrate resistant  prostate cancer.  Now on leuprolide, Xtandi with excellent PSA response down to 0.33 recently.  His biggest issue is psychiatric related.  Plan:  1.  He was given a 5-monthleuprolide injection today  2.  I will leave laboratory draws up to Dr. KDelton Coombes 3.  I will see him back in 3 months for recheck  4.  I will again look into his psychiatric/psychologic consultation

## 2021-03-01 ENCOUNTER — Other Ambulatory Visit: Payer: Self-pay

## 2021-03-01 ENCOUNTER — Ambulatory Visit (INDEPENDENT_AMBULATORY_CARE_PROVIDER_SITE_OTHER): Payer: Medicare Other | Admitting: Urology

## 2021-03-01 ENCOUNTER — Encounter: Payer: Self-pay | Admitting: Urology

## 2021-03-01 VITALS — BP 142/87 | HR 70

## 2021-03-01 DIAGNOSIS — Z79899 Other long term (current) drug therapy: Secondary | ICD-10-CM

## 2021-03-01 DIAGNOSIS — C7951 Secondary malignant neoplasm of bone: Secondary | ICD-10-CM | POA: Diagnosis not present

## 2021-03-01 DIAGNOSIS — R9721 Rising PSA following treatment for malignant neoplasm of prostate: Secondary | ICD-10-CM | POA: Diagnosis not present

## 2021-03-01 DIAGNOSIS — N401 Enlarged prostate with lower urinary tract symptoms: Secondary | ICD-10-CM

## 2021-03-01 DIAGNOSIS — C61 Malignant neoplasm of prostate: Secondary | ICD-10-CM | POA: Diagnosis not present

## 2021-03-01 DIAGNOSIS — N138 Other obstructive and reflux uropathy: Secondary | ICD-10-CM

## 2021-03-01 MED ORDER — LEUPROLIDE ACETATE (6 MONTH) 45 MG IM KIT: 45.0000 mg | PACK | Freq: Once | INTRAMUSCULAR | Status: DC

## 2021-03-01 MED ORDER — LEUPROLIDE ACETATE (6 MONTH) 45 MG ~~LOC~~ KIT
45.0000 mg | PACK | Freq: Once | SUBCUTANEOUS | Status: AC
  Administered 2021-03-01: 45 mg via SUBCUTANEOUS

## 2021-03-01 NOTE — Progress Notes (Signed)
Urological Symptom Review  Patient is experiencing the following symptoms: Get up at night to urinate   Review of Systems  Gastrointestinal (upper)  : Negative for upper GI symptoms  Gastrointestinal (lower) : Negative for lower GI symptoms  Constitutional : Night Sweats Fatigue  Skin: Negative for skin symptoms  Eyes: Negative for eye symptoms  Ear/Nose/Throat : Negative for Ear/Nose/Throat symptoms  Hematologic/Lymphatic: Negative for Hematologic/Lymphatic symptoms  Cardiovascular : Negative for cardiovascular symptoms  Respiratory : Negative for respiratory symptoms  Endocrine: Negative for endocrine symptoms  Musculoskeletal: Negative for musculoskeletal symptoms  Neurological: Negative for neurological symptoms  Psychologic: Depression Anxiety

## 2021-03-08 ENCOUNTER — Other Ambulatory Visit: Payer: Self-pay | Admitting: Urology

## 2021-03-08 DIAGNOSIS — C61 Malignant neoplasm of prostate: Secondary | ICD-10-CM

## 2021-03-17 ENCOUNTER — Other Ambulatory Visit: Payer: Self-pay

## 2021-03-17 ENCOUNTER — Inpatient Hospital Stay (HOSPITAL_COMMUNITY): Payer: Medicare Other | Attending: Hematology

## 2021-03-17 DIAGNOSIS — C779 Secondary and unspecified malignant neoplasm of lymph node, unspecified: Secondary | ICD-10-CM | POA: Diagnosis not present

## 2021-03-17 DIAGNOSIS — I10 Essential (primary) hypertension: Secondary | ICD-10-CM | POA: Insufficient documentation

## 2021-03-17 DIAGNOSIS — C61 Malignant neoplasm of prostate: Secondary | ICD-10-CM | POA: Diagnosis present

## 2021-03-17 DIAGNOSIS — C7951 Secondary malignant neoplasm of bone: Secondary | ICD-10-CM | POA: Insufficient documentation

## 2021-03-17 LAB — COMPREHENSIVE METABOLIC PANEL
ALT: 15 U/L (ref 0–44)
AST: 23 U/L (ref 15–41)
Albumin: 4 g/dL (ref 3.5–5.0)
Alkaline Phosphatase: 51 U/L (ref 38–126)
Anion gap: 8 (ref 5–15)
BUN: 16 mg/dL (ref 8–23)
CO2: 26 mmol/L (ref 22–32)
Calcium: 9 mg/dL (ref 8.9–10.3)
Chloride: 104 mmol/L (ref 98–111)
Creatinine, Ser: 0.96 mg/dL (ref 0.61–1.24)
GFR, Estimated: >60 mL/min (ref >60)
Glucose, Bld: 99 mg/dL (ref 70–99)
Potassium: 4.5 mmol/L (ref 3.5–5.1)
Sodium: 138 mmol/L (ref 135–145)
Total Bilirubin: 0.8 mg/dL (ref 0.3–1.2)
Total Protein: 6.5 g/dL (ref 6.5–8.1)

## 2021-03-17 LAB — PSA: Prostatic Specific Antigen: 0.23 ng/mL (ref 0.00–4.00)

## 2021-03-18 LAB — TESTOSTERONE: Testosterone: 22 ng/dL — ABNORMAL LOW (ref 264–916)

## 2021-03-21 ENCOUNTER — Other Ambulatory Visit (HOSPITAL_COMMUNITY): Payer: Self-pay

## 2021-03-23 ENCOUNTER — Other Ambulatory Visit (HOSPITAL_COMMUNITY): Payer: Self-pay
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
## 2021-03-24 ENCOUNTER — Inpatient Hospital Stay (HOSPITAL_BASED_OUTPATIENT_CLINIC_OR_DEPARTMENT_OTHER): Payer: Medicare Other | Admitting: Hematology

## 2021-03-24 ENCOUNTER — Other Ambulatory Visit: Payer: Self-pay

## 2021-03-24 VITALS — BP 153/74 | HR 60 | Temp 98.1°F | Resp 16 | Wt 155.0 lb

## 2021-03-24 DIAGNOSIS — C61 Malignant neoplasm of prostate: Secondary | ICD-10-CM | POA: Diagnosis not present

## 2021-03-24 NOTE — Patient Instructions (Addendum)
[facility] at [facility] Discharge Instructions  You were seen today by Dr. Katragadda. He went over your recent results. Dr. Katragadda will see you back in 3 months for labs and follow up.   Thank you for choosing  Cancer Center at [facility] to provide your oncology and hematology care.  To afford each patient quality time with our provider, please arrive at least 15 minutes before your scheduled appointment time.   If you have a lab appointment with the Cancer Center please come in thru the Main Entrance and check in at the main information desk  You need to re-schedule your appointment should you arrive 10 or more minutes late.  We strive to give you quality time with our providers, and arriving late affects you and other patients whose appointments are after yours.  Also, if you no show three or more times for appointments you may be dismissed from the clinic at the providers discretion.     Again, thank you for choosing [facility].  Our hope is that these requests will decrease the amount of time that you wait before being seen by our physicians.       _____________________________________________________________  Should you have questions after your visit to [facility], please contact our office at [phone] between the hours of 8:00 a.m. and 4:30 p.m.  Voicemails left after 4:00 p.m. will not be returned until the following business day.  For prescription refill requests, have your pharmacy contact our office and allow 72 hours.    Cancer Center Support Programs:   > Cancer Support Group  2nd Tuesday of the month 1pm-2pm, Journey Room   

## 2021-03-30 ENCOUNTER — Other Ambulatory Visit (HOSPITAL_COMMUNITY): Payer: Self-pay

## 2021-04-07 ENCOUNTER — Ambulatory Visit (INDEPENDENT_AMBULATORY_CARE_PROVIDER_SITE_OTHER): Payer: Medicare Other | Admitting: Psychologist

## 2021-04-07 ENCOUNTER — Other Ambulatory Visit: Payer: Self-pay

## 2021-04-07 DIAGNOSIS — F411 Generalized anxiety disorder: Secondary | ICD-10-CM | POA: Diagnosis not present

## 2021-04-07 DIAGNOSIS — F331 Major depressive disorder, recurrent, moderate: Secondary | ICD-10-CM

## 2021-04-14 ENCOUNTER — Other Ambulatory Visit (HOSPITAL_COMMUNITY): Payer: Self-pay

## 2021-04-19 ENCOUNTER — Other Ambulatory Visit (HOSPITAL_COMMUNITY): Payer: Self-pay

## 2021-04-26 ENCOUNTER — Ambulatory Visit (INDEPENDENT_AMBULATORY_CARE_PROVIDER_SITE_OTHER): Payer: Medicare Other | Admitting: Psychologist

## 2021-04-26 DIAGNOSIS — F411 Generalized anxiety disorder: Secondary | ICD-10-CM

## 2021-04-26 DIAGNOSIS — F331 Major depressive disorder, recurrent, moderate: Secondary | ICD-10-CM

## 2021-04-27 ENCOUNTER — Other Ambulatory Visit (HOSPITAL_COMMUNITY): Payer: Self-pay

## 2021-05-13 ENCOUNTER — Other Ambulatory Visit: Payer: Self-pay

## 2021-05-13 ENCOUNTER — Ambulatory Visit (INDEPENDENT_AMBULATORY_CARE_PROVIDER_SITE_OTHER): Payer: Medicare Other | Admitting: Psychologist

## 2021-05-13 DIAGNOSIS — F331 Major depressive disorder, recurrent, moderate: Secondary | ICD-10-CM | POA: Diagnosis not present

## 2021-05-13 DIAGNOSIS — F411 Generalized anxiety disorder: Secondary | ICD-10-CM | POA: Diagnosis not present

## 2021-05-16 ENCOUNTER — Other Ambulatory Visit (HOSPITAL_COMMUNITY): Payer: Self-pay

## 2021-05-16 ENCOUNTER — Other Ambulatory Visit (HOSPITAL_COMMUNITY): Payer: Self-pay | Admitting: Hematology

## 2021-05-16 DIAGNOSIS — C61 Malignant neoplasm of prostate: Secondary | ICD-10-CM

## 2021-05-16 MED ORDER — XTANDI 40 MG PO CAPS
ORAL_CAPSULE | ORAL | 2 refills | Status: DC
  Filled 2021-05-16: qty 60, 30d supply, fill #0
  Filled 2021-06-13: qty 60, 30d supply, fill #1
  Filled 2021-07-13 – 2021-07-28 (×2): qty 60, 30d supply, fill #2

## 2021-05-17 ENCOUNTER — Other Ambulatory Visit (HOSPITAL_COMMUNITY): Payer: Self-pay

## 2021-05-19 ENCOUNTER — Other Ambulatory Visit (HOSPITAL_COMMUNITY): Payer: Self-pay

## 2021-05-20 ENCOUNTER — Other Ambulatory Visit (HOSPITAL_COMMUNITY): Payer: Self-pay

## 2021-05-23 ENCOUNTER — Other Ambulatory Visit (HOSPITAL_COMMUNITY): Payer: Self-pay

## 2021-05-31 ENCOUNTER — Ambulatory Visit (INDEPENDENT_AMBULATORY_CARE_PROVIDER_SITE_OTHER): Payer: Medicare Other | Admitting: Urology

## 2021-05-31 ENCOUNTER — Encounter: Payer: Self-pay | Admitting: Urology

## 2021-05-31 ENCOUNTER — Other Ambulatory Visit: Payer: Self-pay

## 2021-05-31 VITALS — BP 164/88 | HR 66 | Temp 98.6°F

## 2021-05-31 DIAGNOSIS — N138 Other obstructive and reflux uropathy: Secondary | ICD-10-CM

## 2021-05-31 DIAGNOSIS — R9721 Rising PSA following treatment for malignant neoplasm of prostate: Secondary | ICD-10-CM

## 2021-05-31 DIAGNOSIS — Z79899 Other long term (current) drug therapy: Secondary | ICD-10-CM

## 2021-05-31 DIAGNOSIS — N401 Enlarged prostate with lower urinary tract symptoms: Secondary | ICD-10-CM | POA: Diagnosis not present

## 2021-05-31 DIAGNOSIS — C61 Malignant neoplasm of prostate: Secondary | ICD-10-CM

## 2021-05-31 DIAGNOSIS — C7951 Secondary malignant neoplasm of bone: Secondary | ICD-10-CM | POA: Diagnosis not present

## 2021-05-31 NOTE — Progress Notes (Signed)
History of Present Illness: Here for follow-up of prostate cancer.  Initially underwent TRUS/Bx 8.1.2014. All biopsies revealed adenocarcinoma, GS 4+3 throughout the prostate. Right seminal vesicle revealed a GS 4+4, left revealed a GS 4+3. He underwent radiographic imaging including a bone scan and CT of the abdomen and pelvis. CT failed to reveal any significant adenopathy or evidence of extra prostatic extension. It was recommended that he get 2 years of androgen deprivation. He received his first [place] on 5.13.2014. He received his 1st 6 month Lupron on 6.13.2014. Because his testosterone was not quite castrate, he was switched from Lupron to [employer]. He received a 6 month injection of that in mid -December, 2014. .   12.11.2018: Elevating trend was PSA, most recently 2.8. He has stable urinary symptomatology. He has been extremely anxious of undergoing a repeat biopsy.   10.6.2020: He returns today for follow-up -- he has been being seen by [facility] for his prostate cancer. He was initiated onADT. Most recent PSA was 5.2. Most recent CT and bone scan apparently showed no signs of metastatic disease (2019) -- he has had no repeat imaging. He is not very happy on lupron -- he is having hot flashes 2x a day. He is due his next 6 mo lupron this November.   12.8.2020: PSA 5.7 on 12.1.2020. He reports having some okay days and some awful days with irritability, low energy, and fatigue -- he attributes this to lupron. Testosterone (12.1) measured 29. He has been having surveillance imaging at [facility]. He is due 6 mo lupron today.    4.27.2021: Most recent PSA 11.1, T level 18. Stable symptomatology. He denies any new bony aches/pain though he does c/o some abdominal aching (he thinks this is mostly dietary). Weight remains stable. He denies any blood per urine or stool. Stable bowel pattern.   5.19.2021: PSMA PET--1. T11 osseous metastasis. 2. Asymmetric hypermetabolism within the prostate, suspicious  for residual disease. 3. Mild enlargement of low abdominal retroperitoneal nodes which demonstrate low-level hypermetabolism, suspicious for nodal metastasis. 4. Incidental findings, including: Coronary artery atherosclerosis. Aortic Atherosclerosis (ICD10-I70.0).   7.2021--received 58-month Eligard.   11.16.2021: Pt started on Xtandi by Dr. Delton Coombes and reports that he is tolerating this side-effect profile better than Zytiga. He expresses concern regarding a perceived psychological impact with Lupron injections. Pt notes that his hot flashes have recently improved during the last couple of weeks. He is happy that his PSA has been trending downward. At his last visit I suggested psychiatric consultation. Order was put in for this, but it was never carried out    1.25.2022: Most recent PSA is 0.6, testosterone level under 20.   9-month Eligard administered   4.26.2022: Testosterone level 17, PSA 0.35. Stable voiding sx's.  Having no bony pain.  Appetite good, weight is stable.  Still having some hot flashes but not that bothersome.  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   7.26.2022: PSA 0.23. Still on Xtandi.  Leuprolide 30 mg given  He still has occasional suicidal ideations.  He has multiple other feelings of low self-worth, frustration, agitation.  We have tried getting him therapy but unfortunately the consultant did not take Medicare.  10.25.2022: Still on Xtandi. Stable urination. IPSS 5  QoL score 1 No blood in urine or stool.  Past Medical History:  Diagnosis Date   Anxiety    Bilateral hydronephrosis    Bladder incontinence    night time,  past 2 months  BPH (benign prostatic hypertrophy) with urinary obstruction    Depression    ED (erectile dysfunction)    H/O ascites    trace   Hypertension    Prostate cancer ([place]) 09/24/12   gleason 4+3=7., & 4+4=8,PSA=67.30, volume=33.7cc    Past Surgical History:  Procedure Laterality Date    PROSTATE BIOPSY  09/24/12   Adenocarcinoma   PROSTATE BIOPSY N/A 12/20/2017   Procedure: BIOPSY TRANSRECTAL ULTRASONIC PROSTATE (TUBP);  Surgeon: Franchot Gallo, MD;  Location: [facility];  Service: Urology;  Laterality: N/A;   TONSILLECTOMY      Home Medications:  Allergies as of 05/31/2021   No Known Allergies      Medication List        Accurate as of May 31, 2021 12:15 PM. If you have any questions, ask your nurse or doctor.          Align 4 MG Caps Take 1 capsule by mouth daily.   b complex vitamins capsule Take 1 capsule by mouth daily.   Cholecalciferol 125 MCG (5000 UT) capsule Take 5,000 Units by mouth daily.   Ginseng 100 MG Caps Take by mouth daily.   glucosamine-chondroitin 500-400 MG tablet Take 1 tablet by mouth daily.   L-ARGININE PO Take 1,000 mg by mouth daily.   losartan 25 MG tablet Commonly known as: COZAAR TAKE 1 TABLET(25 MG) BY MOUTH DAILY   melatonin 1 MG Tabs tablet Take 2 mg by mouth at bedtime.   methylphenidate 5 MG tablet Commonly known as: Ritalin Take 0.5 tablets (2.5 mg total) by mouth daily as needed.   Probiotic 1-250 BILLION-MG Caps Take by mouth daily.   tamsulosin 0.4 MG Caps capsule Commonly known as: FLOMAX TAKE 1 CAPSULE(0.4 MG) BY MOUTH DAILY   Turmeric 500 MG Caps Take 1 capsule by mouth daily.   vitamin C 1000 MG tablet Take 1,000 mg by mouth daily.   Xtandi 40 MG capsule Generic drug: enzalutamide TAKE 2 CAPSULES (80 MG TOTAL) BY MOUTH DAILY.   zinc gluconate 50 MG tablet Take 50 mg by mouth daily.        Allergies: No Known Allergies  Family History  Problem Relation Age of Onset   Depression Mother    Dementia Mother    Anxiety disorder Mother    Prostate cancer Father        unconfirmed diagnosis    Social History:  reports that he has never smoked. He has never used smokeless tobacco. He reports current alcohol use of about 2.0 standard drinks per week. He reports that he does  not use drugs.  ROS: A complete review of systems was performed.  All systems are negative except for pertinent findings as noted.  Physical Exam:  Vital signs in last 24 hours: There were no vitals taken for this visit. Constitutional:  Alert and oriented, No acute distress. 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  Currently he feels well.  He does have a moderate residual but this is well within his normal limits, he has minimal voiding symptomatology  Plan:  I will see him back  in 3 months.  Leuprolide injection at that time  Continue tamsulosin

## 2021-05-31 NOTE — Progress Notes (Signed)
Urological Symptom Review  Patient is experiencing the following symptoms: Get up at night to urinate   Review of Systems  Gastrointestinal (upper)  : Negative for upper GI symptoms  Gastrointestinal (lower) : Negative for lower GI symptoms  Constitutional : Night Sweats Fatigue  Skin: Negative for skin symptoms  Eyes: Negative for eye symptoms  Ear/Nose/Throat : Negative for Ear/Nose/Throat symptoms  Hematologic/Lymphatic: Negative for Hematologic/Lymphatic symptoms  Cardiovascular : Negative for cardiovascular symptoms  Respiratory : Negative for respiratory symptoms  Endocrine: Negative for endocrine symptoms  Musculoskeletal: Negative for musculoskeletal symptoms  Neurological: Negative for neurological symptoms  Psychologic: Depression Anxiety

## 2021-06-01 LAB — URINALYSIS, ROUTINE W REFLEX MICROSCOPIC
Bilirubin, UA: NEGATIVE
Glucose, UA: NEGATIVE
Ketones, UA: NEGATIVE
Leukocytes,UA: NEGATIVE
Nitrite, UA: NEGATIVE
Protein,UA: NEGATIVE
RBC, UA: NEGATIVE
Specific Gravity, UA: 1.015 (ref 1.005–1.030)
Urobilinogen, Ur: 0.2 mg/dL (ref 0.2–1.0)
pH, UA: 7.5 (ref 5.0–7.5)

## 2021-06-02 ENCOUNTER — Other Ambulatory Visit: Payer: Self-pay

## 2021-06-02 ENCOUNTER — Ambulatory Visit (INDEPENDENT_AMBULATORY_CARE_PROVIDER_SITE_OTHER): Payer: Medicare Other | Admitting: Psychologist

## 2021-06-02 DIAGNOSIS — F331 Major depressive disorder, recurrent, moderate: Secondary | ICD-10-CM

## 2021-06-02 DIAGNOSIS — F411 Generalized anxiety disorder: Secondary | ICD-10-CM | POA: Diagnosis not present

## 2021-06-13 ENCOUNTER — Other Ambulatory Visit (HOSPITAL_COMMUNITY): Payer: Self-pay

## 2021-06-16 ENCOUNTER — Other Ambulatory Visit (HOSPITAL_COMMUNITY): Payer: Self-pay

## 2021-06-21 ENCOUNTER — Ambulatory Visit (INDEPENDENT_AMBULATORY_CARE_PROVIDER_SITE_OTHER): Payer: Medicare Other | Admitting: Psychologist

## 2021-06-21 DIAGNOSIS — F331 Major depressive disorder, recurrent, moderate: Secondary | ICD-10-CM | POA: Diagnosis not present

## 2021-06-21 DIAGNOSIS — F411 Generalized anxiety disorder: Secondary | ICD-10-CM | POA: Diagnosis not present

## 2021-06-29 ENCOUNTER — Other Ambulatory Visit (HOSPITAL_COMMUNITY): Payer: Self-pay

## 2021-06-29 DIAGNOSIS — C61 Malignant neoplasm of prostate: Secondary | ICD-10-CM

## 2021-07-04 ENCOUNTER — Inpatient Hospital Stay (HOSPITAL_COMMUNITY): Payer: Medicare Other | Attending: Hematology

## 2021-07-04 DIAGNOSIS — Z79899 Other long term (current) drug therapy: Secondary | ICD-10-CM | POA: Insufficient documentation

## 2021-07-04 DIAGNOSIS — F419 Anxiety disorder, unspecified: Secondary | ICD-10-CM | POA: Diagnosis not present

## 2021-07-04 DIAGNOSIS — C61 Malignant neoplasm of prostate: Secondary | ICD-10-CM | POA: Diagnosis not present

## 2021-07-04 DIAGNOSIS — C779 Secondary and unspecified malignant neoplasm of lymph node, unspecified: Secondary | ICD-10-CM | POA: Insufficient documentation

## 2021-07-04 DIAGNOSIS — I10 Essential (primary) hypertension: Secondary | ICD-10-CM | POA: Diagnosis not present

## 2021-07-04 DIAGNOSIS — C7951 Secondary malignant neoplasm of bone: Secondary | ICD-10-CM | POA: Diagnosis present

## 2021-07-04 LAB — CBC WITH DIFFERENTIAL/PLATELET
Abs Immature Granulocytes: 0.01 10*3/uL (ref 0.00–0.07)
Basophils Absolute: 0 10*3/uL (ref 0.0–0.1)
Basophils Relative: 1 %
Eosinophils Absolute: 0.1 10*3/uL (ref 0.0–0.5)
Eosinophils Relative: 3 %
HCT: 44.6 % (ref 39.0–52.0)
Hemoglobin: 14.7 g/dL (ref 13.0–17.0)
Immature Granulocytes: 0 %
Lymphocytes Relative: 23 %
Lymphs Abs: 0.9 10*3/uL (ref 0.7–4.0)
MCH: 32.8 pg (ref 26.0–34.0)
MCHC: 33 g/dL (ref 30.0–36.0)
MCV: 99.6 fL (ref 80.0–100.0)
Monocytes Absolute: 0.5 10*3/uL (ref 0.1–1.0)
Monocytes Relative: 12 %
Neutro Abs: 2.5 10*3/uL (ref 1.7–7.7)
Neutrophils Relative %: 61 %
Platelets: 260 10*3/uL (ref 150–400)
RBC: 4.48 MIL/uL (ref 4.22–5.81)
RDW: 13.4 % (ref 11.5–15.5)
WBC: 4.1 10*3/uL (ref 4.0–10.5)
nRBC: 0 % (ref 0.0–0.2)

## 2021-07-04 LAB — COMPREHENSIVE METABOLIC PANEL
ALT: 14 U/L (ref 0–44)
AST: 21 U/L (ref 15–41)
Albumin: 4 g/dL (ref 3.5–5.0)
Alkaline Phosphatase: 52 U/L (ref 38–126)
Anion gap: 7 (ref 5–15)
BUN: 14 mg/dL (ref 8–23)
CO2: 30 mmol/L (ref 22–32)
Calcium: 9.5 mg/dL (ref 8.9–10.3)
Chloride: 102 mmol/L (ref 98–111)
Creatinine, Ser: 0.98 mg/dL (ref 0.61–1.24)
GFR, Estimated: >60 mL/min (ref >60)
Glucose, Bld: 99 mg/dL (ref 70–99)
Potassium: 4.4 mmol/L (ref 3.5–5.1)
Sodium: 139 mmol/L (ref 135–145)
Total Bilirubin: 0.9 mg/dL (ref 0.3–1.2)
Total Protein: 6.7 g/dL (ref 6.5–8.1)

## 2021-07-04 LAB — PSA: Prostatic Specific Antigen: 0.28 ng/mL (ref 0.00–4.00)

## 2021-07-05 LAB — TESTOSTERONE: Testosterone: 14 ng/dL — ABNORMAL LOW (ref 264–916)

## 2021-07-05 MED ORDER — FULVESTRANT 250 MG/5ML IM SOSY
PREFILLED_SYRINGE | INTRAMUSCULAR | Status: AC
  Filled 2021-07-05: qty 10
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
## 2021-07-11 ENCOUNTER — Inpatient Hospital Stay (HOSPITAL_COMMUNITY): Payer: Medicare Other | Attending: Hematology | Admitting: Hematology

## 2021-07-11 ENCOUNTER — Other Ambulatory Visit (HOSPITAL_COMMUNITY): Payer: Self-pay

## 2021-07-11 ENCOUNTER — Other Ambulatory Visit: Payer: Self-pay

## 2021-07-11 VITALS — BP 146/94 | HR 65 | Temp 97.4°F | Resp 18 | Wt 154.2 lb

## 2021-07-11 DIAGNOSIS — I10 Essential (primary) hypertension: Secondary | ICD-10-CM | POA: Insufficient documentation

## 2021-07-11 DIAGNOSIS — C7951 Secondary malignant neoplasm of bone: Secondary | ICD-10-CM | POA: Insufficient documentation

## 2021-07-11 DIAGNOSIS — C61 Malignant neoplasm of prostate: Secondary | ICD-10-CM | POA: Diagnosis present

## 2021-07-11 DIAGNOSIS — C779 Secondary and unspecified malignant neoplasm of lymph node, unspecified: Secondary | ICD-10-CM | POA: Diagnosis not present

## 2021-07-11 DIAGNOSIS — R5383 Other fatigue: Secondary | ICD-10-CM | POA: Insufficient documentation

## 2021-07-11 DIAGNOSIS — E559 Vitamin D deficiency, unspecified: Secondary | ICD-10-CM | POA: Diagnosis not present

## 2021-07-11 MED ORDER — METHYLPHENIDATE HCL 5 MG PO TABS: 2.5000 mg | ORAL_TABLET | Freq: Every day | ORAL | 0 refills | Status: DC | PRN

## 2021-07-11 NOTE — Patient Instructions (Signed)
[place] at [facility] Discharge Instructions  You were seen and examined today by Dr. Delton Coombes. He reviewed your most recent labs and everything looks okay. Please follow up as scheduled.   Thank you for choosing [place] at [facility] to provide your oncology and hematology care.  To afford each patient quality time with our provider, please arrive at least 15 minutes before your scheduled appointment time.   If you have a lab appointment with the [facility] please come in thru the Main Entrance and check in at the main information desk.  You need to re-schedule your appointment should you arrive 10 or more minutes late.  We strive to give you quality time with our providers, and arriving late affects you and other patients whose appointments are after yours.  Also, if you no show three or more times for appointments you may be dismissed from the clinic at the providers discretion.     Again, thank you for choosing [facility].  Our hope is that these requests will decrease the amount of time that you wait before being seen by our physicians.       _____________________________________________________________  Should you have questions after your visit to [facility], please contact our office at [phone] and follow the prompts.  Our office hours are 8:00 a.m. and 4:30 p.m. Monday - Friday.  Please note that voicemails left after 4:00 p.m. may not be returned until the following business day.  We are closed weekends and major holidays.  You do have access to a nurse 24-7, just call the main number to the clinic [phone] and do not press any options, hold on the line and a nurse will answer the phone.    For prescription refill requests, have your pharmacy contact our office and allow 72 hours.    Due to Covid, you will need to wear a mask upon entering the hospital. If you do not have a mask, a mask will  be given to you at the Main Entrance upon arrival. For doctor visits, patients may have 1 support person age 25 or older with them. For treatment visits, patients can not have anyone with them due to social distancing guidelines and our immunocompromised population.

## 2021-07-12 ENCOUNTER — Other Ambulatory Visit (HOSPITAL_COMMUNITY): Payer: Self-pay | Admitting: *Deleted

## 2021-07-12 MED ORDER — METHYLPHENIDATE HCL 5 MG PO TABS
2.5000 mg | ORAL_TABLET | Freq: Every day | ORAL | 0 refills | Status: DC | PRN
Start: 2021-07-12 — End: 2024-02-21

## 2021-07-13 ENCOUNTER — Encounter (HOSPITAL_COMMUNITY): Payer: Self-pay | Admitting: *Deleted

## 2021-07-13 ENCOUNTER — Other Ambulatory Visit (HOSPITAL_COMMUNITY): Payer: Self-pay

## 2021-07-13 ENCOUNTER — Encounter: Payer: Self-pay | Admitting: General Practice

## 2021-07-13 ENCOUNTER — Telehealth (HOSPITAL_COMMUNITY): Payer: Self-pay | Admitting: General Practice

## 2021-07-13 NOTE — Telephone Encounter (Signed)
[facility] CSW PRogress Notes  Call to patient per request from [place], [facility] staff.  States patient has indicated SI in his assessment, no plan or means. Passive SI, including "thoughts."  Called patient, no answer, left VM w my contact information.  Scheduled call with him for next time I am at [facility].  Edwyna Shell, LCSW Clinical Social Worker Phone:  [phone]

## 2021-07-13 NOTE — Progress Notes (Signed)
[facility] CSW Progress Notes  Patient called, he admits to ongoing struggles with depression and SI, screens at moderate risk using PHQ.  Explored his thought process, he is troubled by multiple issues globally, personally and related to his cancer treatment.  He has no concrete or current plan, has no access to lethal means, has protective factors including his religious faith and fear of legal consequences.  He is currently working w Conception Chancy PhD, health psychologist.  Discussed impact of various cancer treatments on mood and explored possibility of need for referral for medications management to help w mood instability and low energy.  Brought up idea of additional support available through support groups both locally and nationally.  He will think about these options and discuss w his psychologist at next visit on 12/13.  Encouraged him to call as needed for support/resources.   Edwyna Shell, LCSW Clinical Social Worker Phone:  [phone]

## 2021-07-13 NOTE — Progress Notes (Signed)
Received approval letter for Methylphenidate HCL 5 mg tablets from 06/13/21 till further notice.  Document scanned to chart.

## 2021-07-19 ENCOUNTER — Other Ambulatory Visit (HOSPITAL_COMMUNITY): Payer: PRIVATE HEALTH INSURANCE | Admitting: General Practice

## 2021-07-19 ENCOUNTER — Ambulatory Visit (INDEPENDENT_AMBULATORY_CARE_PROVIDER_SITE_OTHER): Payer: Medicare Other | Admitting: Psychologist

## 2021-07-19 ENCOUNTER — Other Ambulatory Visit (HOSPITAL_COMMUNITY): Payer: Self-pay

## 2021-07-19 DIAGNOSIS — F411 Generalized anxiety disorder: Secondary | ICD-10-CM | POA: Diagnosis not present

## 2021-07-19 DIAGNOSIS — F331 Major depressive disorder, recurrent, moderate: Secondary | ICD-10-CM | POA: Diagnosis not present

## 2021-07-19 NOTE — Progress Notes (Signed)
[place] Counselor/Therapist Progress Note  Patient ID: Nathan Smith, MRN: [id number],    Date: 07/19/2021  Time Spent: 2:05 pm to 2:45 pm; Total Time: 45 minutes   This session was held via in person. The patient consented to in-person therapy and was in the clinician's office. Limits of confidentiality were discussed with the patient.   Treatment Type: Individual Therapy  Reported Symptoms: Patient endorsed some depressive symptoms.   Mental Status Exam: Appearance:  Well Groomed     Behavior: Appropriate  Motor: Normal  Speech/Language:  Normal Rate  Affect: Appropriate  Mood: normal  Thought process: normal  Thought content:   WNL  Sensory/Perceptual disturbances:   WNL  Orientation: oriented to person, place, time/date, and situation  Attention: Good  Concentration: Good  Memory: WNL  Fund of knowledge:  Good  Insight:   Fair  Judgment:  Fair  Impulse Control: Good   Risk Assessment: Danger to Self:  No Self-injurious Behavior: No Danger to Others: No Duty to Warn:no Physical Aggression / Violence:No  Access to Firearms a concern: No  Gang Involvement:No   Subjective: Patient described himself as okay and reflected on Thanksgiving. From there, he talked about how he was feeling regarding Christmas. Continuing to talk, he spent the session reflecting on the theme of guilt. He processed thoughts and emotions related to guilt. 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 He also described himself as feeling lethargic about that type of activity. Exploring that theme, patient acknowledged that may be more psychological than physical regarding resistance to that idea. He did acknowledge that some numbers came back a little concerning, and that he was scheduled to see his oncologist. He was agreeable to following up. He denied suicidal and homicidal  ideation.    Interventions: Worked on developing a therapeutic relationship with the patient using active listening and reflective statements. Provided emotional support using empathy and validation. Praised patient for doing slightly better. Used summary statements. Processed how patient was feeling about Christmas. Challenged some of the thoughts expressed. Normalized and validated expressed thoughts and emotions. Used socratic questions to assist the patient gain insight into self. Challenged some of the perception's expressed. Assisted the patient gain insight. Explored what values are meaningful to him. Explored how he could use those values. Provided psychoeducation about volunteer opportunities. Processed emotions related to different themes. Validated concerns. Provided empathic statements. . Assessed for suicidal and homicidal ideation.   Homework: Contact [place]  Next Session: Review homework and living out values. Emotional support  Long Term Goal: Alleviate depressive symptoms  Short Term Goal: Explore ability to play music for cancer patients.   Diagnosis: F33.1 major depressive affective disorder, recurrent, moderate and F41.1 generalized anxiety disorder  Plan: The patient will follow up end of January. Patient's treatment plan is located in therapycharts. Patient has agreed to treatment plan located in therapycharts.   Conception Chancy, PsyD

## 2021-07-20 ENCOUNTER — Other Ambulatory Visit (HOSPITAL_COMMUNITY): Payer: Self-pay

## 2021-07-21 ENCOUNTER — Telehealth (HOSPITAL_COMMUNITY): Payer: Self-pay | Admitting: Pharmacy Technician

## 2021-07-21 ENCOUNTER — Other Ambulatory Visit (HOSPITAL_COMMUNITY): Payer: Self-pay

## 2021-07-25 ENCOUNTER — Other Ambulatory Visit (HOSPITAL_COMMUNITY): Payer: Self-pay

## 2021-07-25 NOTE — Telephone Encounter (Signed)
Called [employer] to check on status of application.  Rep verified application had been received and that she would start the benefits investigation and get the application processing.  Rep stated we would have information about his enrollment faxed to [place] today.  [name] Patient [name] Phone [phone] Fax [phone] 07/25/2021 10:27 AM

## 2021-07-25 NOTE — Telephone Encounter (Signed)
Oral Oncology Patient Advocate Encounter  Coordinated with patient to complete an application for [employer] in an effort to reduce the patient's out of pocket expense for Xtandi to $0.    Application completed and faxed to [phone] on 07/21/21.   [place] phone number for follow up is [phone].   This encounter will be updated until final determination.  [place] Patient [place] Phone [phone] Fax [phone]

## 2021-07-27 ENCOUNTER — Other Ambulatory Visit (HOSPITAL_COMMUNITY): Payer: Self-pay

## 2021-07-28 ENCOUNTER — Other Ambulatory Visit (HOSPITAL_COMMUNITY): Payer: Self-pay

## 2021-08-10 NOTE — Telephone Encounter (Deleted)
Oral Oncology Patient Advocate Encounter  Received notification from [facility] Patient Assistance program that patient has been successfully enrolled into their program to receive Xtandi from the manufacturer at $0 out of pocket until 08/06/22.    I called and spoke with patient.  He knows we will have to re-apply.   Specialty Pharmacy that will dispense medication is [facility].  Patient knows to call the office with questions or concerns.   Oral Oncology Clinic will continue to follow.  [place] Patient [place] Phone [phone] Fax [phone] 08/10/2021 2:04 PM

## 2021-08-12 ENCOUNTER — Other Ambulatory Visit (HOSPITAL_COMMUNITY): Payer: Self-pay

## 2021-08-23 ENCOUNTER — Telehealth (HOSPITAL_COMMUNITY): Payer: Self-pay | Admitting: Pharmacy Technician

## 2021-08-23 ENCOUNTER — Other Ambulatory Visit (HOSPITAL_COMMUNITY): Payer: Self-pay

## 2021-08-23 ENCOUNTER — Other Ambulatory Visit: Payer: Self-pay

## 2021-08-23 NOTE — Telephone Encounter (Signed)
Oral Oncology Patient Advocate Encounter   Was successful in securing patient a $3500 grant from [employer] [place] (PAF) to provide copayment coverage for Xtandi.  This will keep the out of pocket expense at $0.     I have spoken with the patient.    The billing information is as follows and has been shared with [place].   RxBin: [id number] PCN:  [id number] Member ID: [id number] Group ID: [id number] Dates of Eligibility: 08/23/21 through 08/24/22 Look back: 02/24/21  Fund: [place] Patient [place] Phone [phone] Fax [phone] 08/23/2021 4:21 PM

## 2021-08-24 ENCOUNTER — Other Ambulatory Visit: Payer: Self-pay

## 2021-08-24 ENCOUNTER — Telehealth (HOSPITAL_COMMUNITY): Payer: Self-pay | Admitting: Pharmacy Technician

## 2021-08-24 ENCOUNTER — Other Ambulatory Visit (HOSPITAL_COMMUNITY): Payer: Self-pay

## 2021-08-24 MED ORDER — ENZALUTAMIDE 80 MG PO TABS
80.0000 mg | ORAL_TABLET | Freq: Every day | ORAL | 3 refills | Status: DC
  Filled 2021-08-24: qty 30, 30d supply, fill #0
  Filled 2021-09-19: qty 30, 30d supply, fill #1
  Filled 2021-10-17: qty 30, 30d supply, fill #2
  Filled 2021-11-11: qty 30, 30d supply, fill #3

## 2021-08-24 NOTE — Telephone Encounter (Signed)
Chart reviewed. Xtandi refilled per last office visit with Dr. Delton Coombes.

## 2021-08-29 NOTE — Progress Notes (Addendum)
History of Present Illness:   Initially underwent TRUS/Bx 8.1.2014. All biopsies revealed adenocarcinoma, GS 4+3 throughout the prostate. Right seminal vesicle revealed a GS 4+4, left revealed a GS 4+3. He underwent radiographic imaging including a bone scan and CT of the abdomen and pelvis. CT failed to reveal any significant adenopathy or evidence of extra prostatic extension. It was recommended that he get 2 years of androgen deprivation. He received his first [place] on 5.13.2014. He received his 1st 6 month Lupron on 6.13.2014. Because his testosterone was not quite castrate, he was switched from Lupron to [employer]. He received a 6 month injection of that in mid -December, 2014. .   12.11.2018: Elevating trend was PSA, most recently 2.8. He has stable urinary symptomatology. He has been extremely anxious of undergoing a repeat biopsy.   10.6.2020: He returns today for follow-up -- he has been being seen by [facility] for his prostate cancer. He was initiated onADT. Most recent PSA was 5.2. Most recent CT and bone scan apparently showed no signs of metastatic disease (2019) -- he has had no repeat imaging. He is not very happy on lupron -- he is having hot flashes 2x a day. He is due his next 6 mo lupron this November.   12.8.2020: PSA 5.7 on 12.1.2020. He reports having some okay days and some awful days with irritability, low energy, and fatigue -- he attributes this to lupron. Testosterone (12.1) measured 29. He has been having surveillance imaging at [facility]. He is due 6 mo lupron today.    4.27.2021: Most recent PSA 11.1, T level 18. Stable symptomatology. He denies any new bony aches/pain though he does c/o some abdominal aching (he thinks this is mostly dietary). Weight remains stable. He denies any blood per urine or stool. Stable bowel pattern.   5.19.2021: PSMA PET--1. T11 osseous metastasis. 2. Asymmetric hypermetabolism within the prostate, suspicious for residual disease. 3. Mild enlargement  of low abdominal retroperitoneal nodes which demonstrate low-level hypermetabolism, suspicious for nodal metastasis. 4. Incidental findings, including: Coronary artery atherosclerosis. Aortic Atherosclerosis (ICD10-I70.0).   7.2021--received 87-month Eligard.   11.16.2021: Pt started on Xtandi by Dr. Delton Coombes and reports that he is tolerating this side-effect profile better than Zytiga. He expresses concern regarding a perceived psychological impact with Lupron injections. Pt notes that his hot flashes have recently improved during the last couple of weeks. He is happy that his PSA has been trending downward. At his last visit I suggested psychiatric consultation. Order was put in for this, but it was never carried out    1.25.2022: Most recent PSA is 0.6, testosterone level under 20.   62-month Eligard administered   4.26.2022: Testosterone level 17, PSA 0.35. Stable voiding sx's.  Having no bony pain.  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   7.26.2022: PSA 0.23. Still on Xtandi.  Leuprolide 30 mg given  He still has occasional suicidal ideations.  He has multiple other feelings of low self-worth, frustration, agitation.  We have tried getting him therapy but unfortunately the consultant did not take Medicare.   10.25.2022: Still on Xtandi.   1.24.2023: Last PSA from November 0.28.  He feels tired quite often.  He is not having any bony pain.  He denies any blood in his urine or stool.  Urinary symptoms are minimal-IPSS 5, quality-of-life score 1.  He has a lot of questions about dietary modification.  He is trying to eat more vegetables and less animal protein.  Past Medical History:  Diagnosis Date   Anxiety    Bilateral hydronephrosis    Bladder incontinence    night time,  past 2 months   BPH (benign prostatic hypertrophy) with urinary obstruction    Depression    ED (erectile dysfunction)    H/O ascites    trace   Hypertension    Prostate  cancer ([place]) 09/24/12   gleason 4+3=7., & 4+4=8,PSA=67.30, volume=33.7cc    Past Surgical History:  Procedure Laterality Date   PROSTATE BIOPSY  09/24/12   Adenocarcinoma   PROSTATE BIOPSY N/A 12/20/2017   Procedure: BIOPSY TRANSRECTAL ULTRASONIC PROSTATE (TUBP);  Surgeon: Franchot Gallo, MD;  Location: [facility];  Service: Urology;  Laterality: N/A;   TONSILLECTOMY      Home Medications:  Allergies as of 08/30/2021   No Known Allergies      Medication List        Accurate as of August 29, 2021  8:31 PM. If you have any questions, ask your nurse or doctor.          Align 4 MG Caps Take 1 capsule by mouth daily.   b complex vitamins capsule Take 1 capsule by mouth daily.   Cholecalciferol 125 MCG (5000 UT) capsule Take 5,000 Units by mouth daily.   Ginseng 100 MG Caps Take by mouth daily.   L-ARGININE PO Take 1,000 mg by mouth daily.   losartan 25 MG tablet Commonly known as: COZAAR TAKE 1 TABLET(25 MG) BY MOUTH DAILY   melatonin 1 MG Tabs tablet Take 2 mg by mouth at bedtime.   methylphenidate 5 MG tablet Commonly known as: Ritalin Take 0.5 tablets (2.5 mg total) by mouth daily as needed.   Probiotic 1-250 BILLION-MG Caps Take by mouth daily.   tamsulosin 0.4 MG Caps capsule Commonly known as: FLOMAX TAKE 1 CAPSULE(0.4 MG) BY MOUTH DAILY   Turmeric 500 MG Caps Take 1 capsule by mouth daily.   vitamin C 1000 MG tablet Take 1,000 mg by mouth daily.   Xtandi 80 MG tablet Generic drug: enzalutamide Take 1 tablet (80 mg total) by mouth daily.   zinc gluconate 50 MG tablet Take 50 mg by mouth daily.        Allergies: No Known Allergies  Family History  Problem Relation Age of Onset   Depression Mother    Dementia Mother    Anxiety disorder Mother    Prostate cancer Father        unconfirmed diagnosis    Social History:  reports that he has never smoked. He has never used smokeless tobacco. He reports current alcohol use of about 2.0  standard drinks per week. He reports that he does not use drugs.  ROS: A complete review of systems was performed.  All systems are negative except for pertinent findings as noted.  Physical Exam:  Vital signs in last 24 hours: There were no vitals taken for this visit. Constitutional:  Alert and oriented, No acute distress Cardiovascular: Regular rate  Respiratory: Normal respiratory effort GI: Abdomen is soft, nontender, nondistended, no abdominal masses. No CVAT.  Large right inguinal hernia descended into the scrotum, stable. Genitourinary: Normal male phallus, testes are descended bilaterally and non-tender and without masses, scrotum is normal in appearance without lesions or masses, perineum is normal on inspection.  Normal anal sphincter tone.  Gland 60 to 80 g, symmetrical, nonnodular, nontender. Lymphatic: No lymphadenopathy Neurologic: Grossly intact, no focal deficits Psychiatric: Normal mood and affect  I have reviewed prior  pt notes  I have reviewed notes from previous physicians  I have independently reviewed prior imaging  I have reviewed prior PSA/testosterone  results    Impression/Assessment:  1.  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  Overall he is doing well with that  2.  Long-term use of androgen deprivation therapy.  He has not had a recent or any bone density study that I can see  3.  History of BPH, minimal symptomatology.  Plan:  1.  I will order a bone density study in the near future  2.  At his request we will check testosterone and PSA today  3.  Leuprolide acetate 45 mg administered IM  4.  I will see back in about 3 months for routine check

## 2021-08-29 NOTE — Telephone Encounter (Signed)
Oral Oncology Patient Advocate Encounter  While awaiting for approval through [employer], grant funding opened and we were able to secure 2 grants for the patients Xtandi.  Patient is aware he can still get his medication from [facility].  [place] Patient [place] Phone [phone] Fax [phone] 08/29/2021 3:16 PM

## 2021-08-30 ENCOUNTER — Other Ambulatory Visit: Payer: Self-pay

## 2021-08-30 ENCOUNTER — Encounter: Payer: Self-pay | Admitting: Urology

## 2021-08-30 ENCOUNTER — Ambulatory Visit (INDEPENDENT_AMBULATORY_CARE_PROVIDER_SITE_OTHER): Payer: Medicare Other | Admitting: Urology

## 2021-08-30 VITALS — BP 164/89 | HR 70 | Wt 155.0 lb

## 2021-08-30 DIAGNOSIS — C7951 Secondary malignant neoplasm of bone: Secondary | ICD-10-CM

## 2021-08-30 DIAGNOSIS — Z79899 Other long term (current) drug therapy: Secondary | ICD-10-CM | POA: Diagnosis not present

## 2021-08-30 DIAGNOSIS — C61 Malignant neoplasm of prostate: Secondary | ICD-10-CM | POA: Diagnosis not present

## 2021-08-30 DIAGNOSIS — R9721 Rising PSA following treatment for malignant neoplasm of prostate: Secondary | ICD-10-CM | POA: Diagnosis not present

## 2021-08-30 LAB — URINALYSIS, ROUTINE W REFLEX MICROSCOPIC
Bilirubin, UA: NEGATIVE
Glucose, UA: NEGATIVE
Ketones, UA: NEGATIVE
Leukocytes,UA: NEGATIVE
Nitrite, UA: NEGATIVE
Protein,UA: NEGATIVE
RBC, UA: NEGATIVE
Specific Gravity, UA: 1.015 (ref 1.005–1.030)
Urobilinogen, Ur: 0.2 mg/dL (ref 0.2–1.0)
pH, UA: 7 (ref 5.0–7.5)

## 2021-08-30 MED ORDER — LEUPROLIDE ACETATE (6 MONTH) 45 MG ~~LOC~~ KIT
45.0000 mg | PACK | Freq: Once | SUBCUTANEOUS | Status: AC
  Administered 2021-08-30: 16:00:00 45 mg via SUBCUTANEOUS

## 2021-08-30 NOTE — Progress Notes (Signed)

## 2021-08-31 LAB — TESTOSTERONE: Testosterone: 23 ng/dL — ABNORMAL LOW (ref 264–916)

## 2021-08-31 LAB — PSA: Prostate Specific Ag, Serum: 0.3 ng/mL (ref 0.0–4.0)

## 2021-09-01 ENCOUNTER — Other Ambulatory Visit: Payer: Self-pay

## 2021-09-01 ENCOUNTER — Ambulatory Visit (INDEPENDENT_AMBULATORY_CARE_PROVIDER_SITE_OTHER): Payer: Medicare Other | Admitting: Psychologist

## 2021-09-01 DIAGNOSIS — F411 Generalized anxiety disorder: Secondary | ICD-10-CM | POA: Diagnosis not present

## 2021-09-01 DIAGNOSIS — F331 Major depressive disorder, recurrent, moderate: Secondary | ICD-10-CM

## 2021-09-01 NOTE — Progress Notes (Signed)
[place] Counselor/Therapist Progress Note  Patient ID: Nathan Smith, MRN: [id number],    Date: 09/01/2021  Time Spent: 3:07 pm to 3:46 pm; Total Time: 39 minutes   This session was held via in person. The patient consented to in-person therapy and was in the clinician's office. Limits of confidentiality were discussed with the patient.   Treatment Type: Individual Therapy  Reported Symptoms: Patient described himself as doing better.    Mental Status Exam: Appearance:  Well Groomed     Behavior: Appropriate  Motor: Normal  Speech/Language:  Normal Rate  Affect: Appropriate  Mood: normal  Thought process: normal  Thought content:   WNL  Sensory/Perceptual disturbances:   WNL  Orientation: oriented to person, place, time/date, and situation  Attention: Good  Concentration: Good  Memory: WNL  Fund of knowledge:  Good  Insight:   Fair  Judgment:  Fair  Impulse Control: Good   Risk Assessment: Danger to Self:  No Self-injurious Behavior: No Danger to Others: No Duty to Warn:no Physical Aggression / Violence:No  Access to Firearms a concern: No  Gang Involvement:No   Subjective: Patient described himself as doing well and indicated that his most recent blood tests indicate that he is doing well. From there he talked about how he has been doing surprisingly well since the last session. Elaborating, he voiced that he has been doing activities such as performing, even when feeling uncomfortable which has helped. He briefly processed what values align with that and how that behavior may be helping. [place] Kitchen He was agreeable to following up. He denied suicidal and homicidal ideation.    Interventions: Worked on developing a therapeutic relationship with the patient using active listening and reflective statements. Provided emotional support using empathy and validation. Used summary statements. Praised patient for doing well and receiving positive news related to test  results. Processed what has helped the patient feel as though he is not as down. Praised patient for performing. Used socratic questions to assist the patient gain insight into self. Processed and explored values. Discussed how patient can use values to guide behavior. Processed next steps patient can take to continue experiencing an improvement. Assessed for suicidal and homicidal ideation.   Homework: Identify values  Next Session: Review values, identify and make SMART goals. Emotional support  Diagnosis: F33.1 major depressive affective disorder, recurrent, moderate and F41.1 generalized anxiety disorder  Plan:   Client Abilities: Friendly and easy to develop rapport  Client Preferences: ACT and CBT  Client statement of Needs: Coping skills and emotional support  Treatment Level:  Goals Alleviate depressive symptoms Recognize, accept, and cope with depressive feelings Develop healthy thinking patterns Develop healthy interpersonal relationships Reduce overall frequency, intensity, and duration of anxiety Stabilize anxiety level wile increasing ability to function Enhance ability to effectively cope with full variety of stressors Learn and implement coping skills that result in a reduction of anxiety   Objectives target date for all objectives is 04/07/2022 Verbalize an understanding of the cognitive, physiological, and behavioral components of anxiety Learning and implement calming skills to reduce overall anxiety Verbalize an understanding of the role that cognitive biases play in excessive irrational worry and persistent anxiety symptoms Identify, challenge, and replace based fearful talk Learn and implement problem solving strategies Identify and engage in pleasant activities Learning and implement personal and interpersonal skills to reduce anxiety and improve interpersonal relationships Learn to accept limitations in life and commit to tolerating, rather than avoiding,  unpleasant emotions while accomplishing meaningful goals  Identify major life conflicts from the past and present that form the basis for present anxiety Maintain involvement in work, family, and social activities Reestablish a consistent sleep-wake cycle Cooperate with a medical evaluation  Cooperate with a medication evaluation by a physician Verbalize an accurate understanding of depression Verbalize an understanding of the treatment Identify and replace thoughts that support depression Learn and implement behavioral strategies Verbalize an understanding and resolution of current interpersonal problems Learn and implement problem solving and decision making skills Learn and implement conflict resolution skills to resolve interpersonal problems Verbalize an understanding of healthy and unhealthy emotions verbalize insight into how past relationships may be influence current experiences with depression Use mindfulness and acceptance strategies and increase value based behavior  Increase hopeful statements about the future.  Interventions Engage the patient in behavioral activation Use instruction, modeling, and role-playing to build the client's general social, communication, and/or conflict resolution skills Use Acceptance and Commitment Therapy to help client accept uncomfortable realities in order to accomplish value-consistent goals Reinforce the client's insight into the role of his/her past emotional pain and present anxiety  Support the client in following through with work, family, and social activities Teach and implement sleep hygiene practices  Refer the patient to a physician for a psychotropic medication consultation Monito the clint's psychotropic medication compliance Discuss how anxiety typically involves excessive worry, various bodily expressions of tension, and avoidance of what is threatening that interact to maintain the problem  Teach the patient relaxation  skills Assign the patient homework Discuss examples demonstrating that unrealistic worry overestimates the probability of threats and underestimates patient's ability  Assist the patient in analyzing his or her worries Help patient understand that avoidance is reinforcing  Consistent with treatment model, discuss how change in cognitive, behavioral, and interpersonal can help client alleviate depression CBT Behavioral activation help the client explore the relationship, nature of the dispute,  Help the client develop new interpersonal skills and relationships Conduct Problem solving therapy Teach conflict resolution skills Use a process-experiential approach Conduct TLDP Conduct ACT Evaluate need for psychotropic medication Monitor adherence to medication   The patient and clinician reviewed the treatment plan on 04/26/2021. The patient approved of the treatment.  Conception Chancy, PsyD                  Conception Chancy, PsyD

## 2021-09-08 ENCOUNTER — Telehealth (HOSPITAL_COMMUNITY): Payer: Self-pay | Admitting: Pharmacy Technician

## 2021-09-09 ENCOUNTER — Other Ambulatory Visit (HOSPITAL_COMMUNITY): Payer: Self-pay

## 2021-09-09 NOTE — Telephone Encounter (Signed)
Oral Oncology Patient Advocate Encounter   Was successful in securing patient an $48 grant from [facility]) to provide copayment coverage for [place].  This will keep the out of pocket expense at $0.     I have spoken with the patient.    The billing information is as follows and has been shared with [place].   Member ID: [id number] Group ID: [id number] RxBin: [id number] Dates of Eligibility: 07/09/21 through 07/08/22  Fund:  [place] Patient [place] Phone [phone] Fax [phone] 09/09/2021 1:13 PM

## 2021-09-12 NOTE — Telephone Encounter (Signed)
Oral Oncology Patient Advocate Encounter  Was successful in securing patient an $73 grant from [employer] to provide copayment coverage for [place].  This will keep the out of pocket expense at $0.     [employer] ID: [id number]  I have spoken with the patient.   The billing information is as follows and has been shared with [place].    RxBin: [id number] PCN: [id number] Member ID: [id number] Group ID: [id number] Dates of Eligibility: 07/25/21 through 07/24/22  Fund:  [place] Patient [place] Phone [phone] Fax [phone] 09/12/2021 9:28 AM

## 2021-09-19 ENCOUNTER — Other Ambulatory Visit (HOSPITAL_COMMUNITY): Payer: Self-pay

## 2021-09-20 ENCOUNTER — Other Ambulatory Visit (HOSPITAL_COMMUNITY): Payer: Self-pay

## 2021-09-21 ENCOUNTER — Other Ambulatory Visit (HOSPITAL_COMMUNITY): Payer: Self-pay

## 2021-09-22 ENCOUNTER — Other Ambulatory Visit (HOSPITAL_COMMUNITY): Payer: Self-pay

## 2021-09-27 ENCOUNTER — Other Ambulatory Visit: Payer: Self-pay

## 2021-09-27 ENCOUNTER — Ambulatory Visit (INDEPENDENT_AMBULATORY_CARE_PROVIDER_SITE_OTHER): Payer: Medicare Other | Admitting: Psychologist

## 2021-09-27 DIAGNOSIS — F411 Generalized anxiety disorder: Secondary | ICD-10-CM

## 2021-09-27 DIAGNOSIS — F331 Major depressive disorder, recurrent, moderate: Secondary | ICD-10-CM
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
## 2021-10-10 ENCOUNTER — Inpatient Hospital Stay (HOSPITAL_COMMUNITY): Payer: Medicare Other | Attending: Hematology

## 2021-10-10 DIAGNOSIS — C7951 Secondary malignant neoplasm of bone: Secondary | ICD-10-CM | POA: Insufficient documentation

## 2021-10-10 DIAGNOSIS — I10 Essential (primary) hypertension: Secondary | ICD-10-CM | POA: Insufficient documentation

## 2021-10-10 DIAGNOSIS — Z923 Personal history of irradiation: Secondary | ICD-10-CM | POA: Diagnosis not present

## 2021-10-10 DIAGNOSIS — R634 Abnormal weight loss: Secondary | ICD-10-CM | POA: Insufficient documentation

## 2021-10-10 DIAGNOSIS — C61 Malignant neoplasm of prostate: Secondary | ICD-10-CM | POA: Diagnosis present

## 2021-10-10 DIAGNOSIS — Z1509 Genetic susceptibility to other malignant neoplasm: Secondary | ICD-10-CM | POA: Insufficient documentation

## 2021-10-10 DIAGNOSIS — Z79899 Other long term (current) drug therapy: Secondary | ICD-10-CM | POA: Diagnosis not present

## 2021-10-10 DIAGNOSIS — E559 Vitamin D deficiency, unspecified: Secondary | ICD-10-CM

## 2021-10-10 DIAGNOSIS — R197 Diarrhea, unspecified: Secondary | ICD-10-CM | POA: Insufficient documentation

## 2021-10-10 DIAGNOSIS — R5383 Other fatigue: Secondary | ICD-10-CM | POA: Diagnosis not present

## 2021-10-10 LAB — COMPREHENSIVE METABOLIC PANEL
ALT: 13 U/L (ref 0–44)
AST: 22 U/L (ref 15–41)
Albumin: 3.9 g/dL (ref 3.5–5.0)
Alkaline Phosphatase: 57 U/L (ref 38–126)
Anion gap: 10 (ref 5–15)
BUN: 12 mg/dL (ref 8–23)
CO2: 26 mmol/L (ref 22–32)
Calcium: 9.2 mg/dL (ref 8.9–10.3)
Chloride: 103 mmol/L (ref 98–111)
Creatinine, Ser: 0.8 mg/dL (ref 0.61–1.24)
GFR, Estimated: >60 mL/min (ref >60)
Glucose, Bld: 102 mg/dL — ABNORMAL HIGH (ref 70–99)
Potassium: 4.1 mmol/L (ref 3.5–5.1)
Sodium: 139 mmol/L (ref 135–145)
Total Bilirubin: 0.7 mg/dL (ref 0.3–1.2)
Total Protein: 6.9 g/dL (ref 6.5–8.1)

## 2021-10-10 LAB — CBC WITH DIFFERENTIAL/PLATELET
Abs Immature Granulocytes: 0.02 10*3/uL (ref 0.00–0.07)
Basophils Absolute: 0 10*3/uL (ref 0.0–0.1)
Basophils Relative: 1 %
Eosinophils Absolute: 0.1 10*3/uL (ref 0.0–0.5)
Eosinophils Relative: 2 %
HCT: 44.1 % (ref 39.0–52.0)
Hemoglobin: 14.7 g/dL (ref 13.0–17.0)
Immature Granulocytes: 0 %
Lymphocytes Relative: 16 %
Lymphs Abs: 0.8 10*3/uL (ref 0.7–4.0)
MCH: 33.4 pg (ref 26.0–34.0)
MCHC: 33.3 g/dL (ref 30.0–36.0)
MCV: 100.2 fL — ABNORMAL HIGH (ref 80.0–100.0)
Monocytes Absolute: 0.6 10*3/uL (ref 0.1–1.0)
Monocytes Relative: 11 %
Neutro Abs: 3.5 10*3/uL (ref 1.7–7.7)
Neutrophils Relative %: 70 %
Platelets: 255 10*3/uL (ref 150–400)
RBC: 4.4 MIL/uL (ref 4.22–5.81)
RDW: 13.5 % (ref 11.5–15.5)
WBC: 5 10*3/uL (ref 4.0–10.5)
nRBC: 0 % (ref 0.0–0.2)

## 2021-10-10 LAB — PSA: Prostatic Specific Antigen: 0.3 ng/mL (ref 0.00–4.00)

## 2021-10-10 LAB — VITAMIN D 25 HYDROXY (VIT D DEFICIENCY, FRACTURES): Vit D, 25-Hydroxy: 62.94 ng/mL (ref 30–100)

## 2021-10-11 LAB — TESTOSTERONE: Testosterone: 24 ng/dL — ABNORMAL LOW (ref 264–916)

## 2021-10-17 ENCOUNTER — Other Ambulatory Visit: Payer: Self-pay

## 2021-10-17 ENCOUNTER — Inpatient Hospital Stay (HOSPITAL_BASED_OUTPATIENT_CLINIC_OR_DEPARTMENT_OTHER): Payer: Medicare Other | Admitting: Hematology

## 2021-10-17 ENCOUNTER — Other Ambulatory Visit (HOSPITAL_COMMUNITY): Payer: Self-pay

## 2021-10-17 VITALS — BP 159/72 | HR 70 | Temp 97.3°F | Resp 17 | Ht 73.0 in | Wt 147.8 lb

## 2021-10-17 DIAGNOSIS — E559 Vitamin D deficiency, unspecified: Secondary | ICD-10-CM

## 2021-10-17 DIAGNOSIS — C61 Malignant neoplasm of prostate: Secondary | ICD-10-CM
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
## 2021-10-17 NOTE — Patient Instructions (Addendum)
[place] at [facility] ?Discharge Instructions ? ?You were seen and examined today by Dr. Delton Coombes. He reviewed your most recent labs and everything was normal. Continue taking the [facility] as prescribed. Please keep follow up appointments as scheduled in 3 months. ? ? ?Thank you for choosing [place] at [facility] to provide your oncology and hematology care.  To afford each patient quality time with our provider, please arrive at least 15 minutes before your scheduled appointment time.  ? ?If you have a lab appointment with the [place] please come in thru the Main Entrance and check in at the main information desk. ? ?You need to re-schedule your appointment should you arrive 10 or more minutes late.  We strive to give you quality time with our providers, and arriving late affects you and other patients whose appointments are after yours.  Also, if you no show three or more times for appointments you may be dismissed from the clinic at the providers discretion.     ?Again, thank you for choosing [facility].  Our hope is that these requests will decrease the amount of time that you wait before being seen by our physicians.       ?_____________________________________________________________ ? ?Should you have questions after your visit to [facility], please contact our office at [phone] and follow the prompts.  Our office hours are 8:00 a.m. and 4:30 p.m. Monday - Friday.  Please note that voicemails left after 4:00 p.m. may not be returned until the following business day.  We are closed weekends and major holidays.  You do have access to a nurse 24-7, just call the main number to the clinic [phone] and do not press any options, hold on the line and a nurse will answer the phone.   ? ?For prescription refill requests, have your pharmacy contact our office and allow 72 hours.   ? ?Due to Covid, you will need to wear a  mask upon entering the hospital. If you do not have a mask, a mask will be given to you at the Main Entrance upon arrival. For doctor visits, patients may have 1 support person age 27 or older with them. For treatment visits, patients can not have anyone with them due to social distancing guidelines and our immunocompromised population.  ? ?  ?

## 2021-10-20 ENCOUNTER — Other Ambulatory Visit (HOSPITAL_COMMUNITY): Payer: Self-pay

## 2021-10-25 ENCOUNTER — Ambulatory Visit: Payer: PRIVATE HEALTH INSURANCE | Admitting: Psychologist

## 2021-11-10 ENCOUNTER — Other Ambulatory Visit (HOSPITAL_COMMUNITY): Payer: Self-pay

## 2021-11-11 ENCOUNTER — Other Ambulatory Visit (HOSPITAL_COMMUNITY): Payer: Self-pay

## 2021-11-16 ENCOUNTER — Other Ambulatory Visit (HOSPITAL_COMMUNITY): Payer: Self-pay

## 2021-11-29 ENCOUNTER — Encounter: Payer: Self-pay | Admitting: Urology

## 2021-11-29 ENCOUNTER — Ambulatory Visit (INDEPENDENT_AMBULATORY_CARE_PROVIDER_SITE_OTHER): Payer: Medicare Other | Admitting: Urology

## 2021-11-29 VITALS — BP 157/82 | HR 66

## 2021-11-29 DIAGNOSIS — R9721 Rising PSA following treatment for malignant neoplasm of prostate: Secondary | ICD-10-CM

## 2021-11-29 DIAGNOSIS — C7951 Secondary malignant neoplasm of bone: Secondary | ICD-10-CM | POA: Diagnosis not present

## 2021-11-29 DIAGNOSIS — C61 Malignant neoplasm of prostate: Secondary | ICD-10-CM | POA: Diagnosis not present

## 2021-11-29 MED ORDER — MEGESTROL ACETATE 20 MG PO TABS: 20.0000 mg | ORAL_TABLET | Freq: Two times a day (BID) | ORAL | 11 refills | Status: DC

## 2021-11-29 NOTE — Progress Notes (Signed)
History of Present Illness:  ?Initially underwent TRUS/Bx 8.1.2014. All biopsies revealed adenocarcinoma, GS 4+3 throughout the prostate. Right seminal vesicle revealed a GS 4+4, left revealed a GS 4+3. He underwent radiographic imaging including a bone scan and CT of the abdomen and pelvis. CT failed to reveal any significant adenopathy or evidence of extra prostatic extension. It was recommended that he get 2 years of androgen deprivation. He received his first [place] on 5.13.2014. He received his 1st 6 month Lupron on 6.13.2014. Because his testosterone was not quite castrate, he was switched from Lupron to [employer]. He received a 6 month injection of that in mid -December, 2014. . ?  ?12.11.2018: Elevating trend was PSA, most recently 2.8. He has stable urinary symptomatology. He has been extremely anxious of undergoing a repeat biopsy. ?  ?10.6.2020: He returns today for follow-up -- he has been being seen by [facility] for his prostate cancer. He was initiated on ADT. Most recent PSA was 5.2. Most recent CT and bone scan apparently showed no signs of metastatic disease (2019) -- he has had no repeat imaging. He is not very happy on lupron -- he is having hot flashes 2x a day. He is due his next 6 mo lupron this November. ?  ?12.8.2020: PSA 5.7 on 12.1.2020. He reports having some okay days and some awful days with irritability, low energy, and fatigue -- he attributes this to lupron. Testosterone (12.1) measured 29. He has been having surveillance imaging at [facility]. He is due 6 mo lupron today.  ?  ?4.27.2021: Most recent PSA 11.1, T level 18. Stable symptomatology. He denies any new bony aches/pain though he does c/o some abdominal aching (he thinks this is mostly dietary). Weight remains stable. He denies any blood per urine or stool. Stable bowel pattern. ?  ?5.19.2021: PSMA PET--1. T11 osseous metastasis. ?2. Asymmetric hypermetabolism within the prostate, suspicious for ?residual disease. ?3. Mild enlargement  of low abdominal retroperitoneal nodes which ?demonstrate low-level hypermetabolism, suspicious for nodal ?metastasis. ?4. Incidental findings, including: Coronary artery atherosclerosis. ?Aortic Atherosclerosis (ICD10-I70.0). ?  ?7.2021--received 57-monthEligard. ?  ?11.16.2021: Pt started on Xtandi by Dr. KDelton Coombesand reports that he is tolerating this side-effect profile better than Zytiga. He expresses concern regarding a perceived psychological impact with Lupron injections. Pt notes that his hot flashes have recently improved during the last couple of weeks. ?He is happy that his PSA has been trending downward. ?At his last visit I suggested psychiatric consultation. Order was put in for this, but it was never carried out  ?  ?1.25.2022: Most recent PSA is 0.6, testosterone level under 20.   666-monthligard administered ?  ?4.26.2022: Testosterone level 17, PSA 0.35. Stable voiding sx's.  Having no bony pain.  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  ?7.26.2022: PSA 0.23. Still on Xtandi.  Leuprolide 30 mg given. He still has occasional suicidal ideations.  He has multiple other feelings of low self-worth, frustration, agitation.  We have tried getting him therapy but unfortunately the consultant did not take Medicare. ?  ?1.24.2023: Last PSA from November 0.28.  He feels tired quite often.  He is not having any bony pain.  He denies any blood in his urine or stool.  Urinary symptoms are minimal-IPSS 5, quality-of-life score 1.  He has a lot of questions about dietary modification.  He is trying to eat more vegetables and less animal protein. Leuprolide 45 mg administered. ? ?4.25.2023: PSA  0.30, T level 24. He c/o usual emotional issues but has worsening fatigue. Has 7 lb wt loss that is hard to gain back. Has worsening hot flashes. ? ?Past Medical History:  ?Diagnosis Date  ? Anxiety   ? Bilateral hydronephrosis   ? Bladder incontinence   ? night time,  past 2 months  ?  BPH (benign prostatic hypertrophy) with urinary obstruction   ? Depression   ? ED (erectile dysfunction)   ? H/O ascites   ? trace  ? Hypertension   ? Prostate cancer ([place]) 09/24/12  ? gleason 4+3=7., & 4+4=8,PSA=67.30, volume=33.7cc  ? ? ?Past Surgical History:  ?Procedure Laterality Date  ? PROSTATE BIOPSY  09/24/12  ? Adenocarcinoma  ? PROSTATE BIOPSY N/A 12/20/2017  ? Procedure: BIOPSY TRANSRECTAL ULTRASONIC PROSTATE (TUBP);  Surgeon: Franchot Gallo, MD;  Location: [facility];  Service: Urology;  Laterality: N/A;  ? TONSILLECTOMY    ? ? ?Home Medications:  ?Allergies as of 11/29/2021   ?No Known Allergies ?  ? ?  ?Medication List  ?  ? ?  ? Accurate as of November 29, 2021  9:07 AM. If you have any questions, ask your nurse or doctor.  ?  ?  ? ?  ? ?Align 4 MG Caps ?Take 1 capsule by mouth daily. ?  ?b complex vitamins capsule ?Take 1 capsule by mouth daily. ?  ?Cholecalciferol 125 MCG (5000 UT) capsule ?Take 5,000 Units by mouth daily. ?  ?Ginseng 100 MG Caps ?Take by mouth daily. ?  ?L-ARGININE PO ?Take 1,000 mg by mouth daily. ?  ?losartan 25 MG tablet ?Commonly known as: COZAAR ?TAKE 1 TABLET(25 MG) BY MOUTH DAILY ?  ?melatonin 1 MG Tabs tablet ?Take 2 mg by mouth at bedtime. ?  ?methylphenidate 5 MG tablet ?Commonly known as: Ritalin ?Take 0.5 tablets (2.5 mg total) by mouth daily as needed. ?  ?Probiotic 1-250 BILLION-MG Caps ?Take by mouth daily. ?  ?tamsulosin 0.4 MG Caps capsule ?Commonly known as: FLOMAX ?TAKE 1 CAPSULE(0.4 MG) BY MOUTH DAILY ?  ?vitamin C 1000 MG tablet ?Take 1,000 mg by mouth daily. ?  ?Xtandi 80 MG tablet ?Generic drug: enzalutamide ?Take 1 tablet (80 mg total) by mouth daily. ?  ?zinc gluconate 50 MG tablet ?Take 50 mg by mouth daily. ?  ? ?  ? ? ?Allergies: No Known Allergies ? ?Family History  ?Problem Relation Age of Onset  ? Depression Mother   ? Dementia Mother   ? Anxiety disorder Mother   ? Prostate cancer Father   ?     unconfirmed diagnosis  ? ? ?Social History:  reports that he  has never smoked. He has never used smokeless tobacco. He reports current alcohol use of about 2.0 standard drinks per week. He reports that he does not use drugs. ? ?ROS: ?A complete review of systems was performed.  All systems are negative except for pertinent findings as noted. ? ?Physical Exam:  ?Vital signs in last 24 hours: ?There were no vitals taken for this visit. ?Constitutional:  Alert and oriented, No acute distress ?Cardiovascular: Regular rate  ?Respiratory: Normal respiratory effort ?Neurologic: Grossly intact, no focal deficits ?Psychiatric: Normal mood and affect ? ?I have reviewed prior pt notes ? ?I have reviewed notes from referring/previous physicians ? ?I have reviewed urinalysis results ? ?I have reviewed prior PSA/ T results ? ? ? ?Impression/Assessment:  ?Metastatic castrate resistant prostate cancer, on enzalutamide/Lupron.  Overall, excellent PSA data curve ? ? ? ?Plan:  ?The  longest part of our visit today was talking about his psychiatric status.  He is still having hot flashes and is up and down with his motivation/energy level. ? ?I think it worthwhile to try him on Mega Strahl acetate.  I prescribed him 20 mg twice a day ? ?He will come back in 3 months for his next 45 mg leuprolide injection.  Continue vitamin D/calcium, recommend continued protein supplements ? ?

## 2021-11-30 LAB — URINALYSIS, ROUTINE W REFLEX MICROSCOPIC
Bilirubin, UA: NEGATIVE
Glucose, UA: NEGATIVE
Ketones, UA: NEGATIVE
Leukocytes,UA: NEGATIVE
Nitrite, UA: NEGATIVE
Protein,UA: NEGATIVE
RBC, UA: NEGATIVE
Specific Gravity, UA: 1.015 (ref 1.005–1.030)
Urobilinogen, Ur: 0.2 mg/dL (ref 0.2–1.0)
pH, UA: 7 (ref 5.0–7.5)

## 2021-12-05 ENCOUNTER — Encounter (HOSPITAL_COMMUNITY): Payer: Self-pay

## 2021-12-05 NOTE — Progress Notes (Signed)
VM received from patient requesting a call back. Attempted to call patient back, unable to reach patient. Detailed VM left asking that patient call me back for further discussion. ?

## 2021-12-07 ENCOUNTER — Encounter (HOSPITAL_COMMUNITY): Payer: Self-pay

## 2021-12-07 NOTE — Progress Notes (Signed)
Patient called asking if Lupron could be given here in the cancer center. Encouraged patient to call insurance to see if out of pocket cost would change. Patient verbalized understanding but stated he would discuss this further with Dr. Delton Coombes at his next visit. ?

## 2021-12-09 ENCOUNTER — Other Ambulatory Visit (HOSPITAL_COMMUNITY): Payer: Self-pay | Admitting: Hematology

## 2021-12-09 ENCOUNTER — Other Ambulatory Visit (HOSPITAL_COMMUNITY): Payer: Self-pay

## 2021-12-12 ENCOUNTER — Other Ambulatory Visit: Payer: Self-pay | Admitting: *Deleted

## 2021-12-12 ENCOUNTER — Other Ambulatory Visit (HOSPITAL_COMMUNITY): Payer: Self-pay

## 2021-12-12 MED ORDER — ENZALUTAMIDE 80 MG PO TABS
80.0000 mg | ORAL_TABLET | Freq: Every day | ORAL | 3 refills | Status: DC
  Filled 2021-12-12 (×2): qty 30, 30d supply, fill #0
  Filled 2022-01-10: qty 30, 30d supply, fill #1
  Filled 2022-02-06: qty 30, 30d supply, fill #2
  Filled 2022-03-09: qty 30, 30d supply, fill #3

## 2021-12-12 NOTE — Telephone Encounter (Signed)
Xtandi 80 mg refill approved per request.  Per last ovn, patient is tolerating and is to continue therapy. ?

## 2021-12-15 ENCOUNTER — Other Ambulatory Visit (HOSPITAL_COMMUNITY): Payer: Self-pay

## 2022-01-10 ENCOUNTER — Ambulatory Visit (INDEPENDENT_AMBULATORY_CARE_PROVIDER_SITE_OTHER): Payer: Medicare Other | Admitting: Psychologist

## 2022-01-10 ENCOUNTER — Other Ambulatory Visit (HOSPITAL_COMMUNITY): Payer: Self-pay

## 2022-01-10 DIAGNOSIS — F411 Generalized anxiety disorder: Secondary | ICD-10-CM | POA: Diagnosis not present

## 2022-01-10 DIAGNOSIS — F331 Major depressive disorder, recurrent, moderate: Secondary | ICD-10-CM | POA: Diagnosis not present

## 2022-01-10 NOTE — Progress Notes (Signed)
[facility] Counselor/Therapist Progress Note  Patient ID: Nathan Smith, MRN: [id number],    Date: 01/10/2022  Time Spent: 3:07 pm to 3:47 pm; total time: 40 minutes   This session was held via in person. The patient consented to in-person therapy and was in the clinician's office. Limits of confidentiality were discussed with the patient.   Treatment Type: Individual Therapy  Reported Symptoms: Irritation with pastor and physician    Mental Status Exam: Appearance:  Well Groomed     Behavior: Appropriate  Motor: Normal  Speech/Language:  Normal Rate  Affect: Appropriate  Mood: normal  Thought process: normal  Thought content:   WNL  Sensory/Perceptual disturbances:   WNL  Orientation: oriented to person, place, time/date, and situation  Attention: Good  Concentration: Good  Memory: WNL  Fund of knowledge:  Good  Insight:   Fair  Judgment:  Fair  Impulse Control: Good   Risk Assessment: Danger to Self:  No Self-injurious Behavior: No Danger to Others: No Duty to Warn:no Physical Aggression / Violence:No  Access to Firearms a concern: No  Gang Involvement:No   Subjective: Patient described himself as fair and reflected on events that have occurred in his life since the last appointment. Patient voiced some distress over someone who might be "ghosting" him. From there, he spent the rest of the session voicing and reflecting on frustrations with his pastor and urologist as both of them indicated that maybe it would be better if patient stopped his treatment for cancer. Per the patient, he associates stopping treatment as being suicidal and he denied suicidal ideation. He stated he is not ready to die. He reflected on these frustrations the whole session.  He was agreeable to following up. He denied suicidal and homicidal ideation.    Interventions: Worked on developing a therapeutic relationship with the patient using active listening and reflective statements.  Provided emotional support using empathy and validation. Reflected on the events since the last session. Used summary statements. Normalized and validated some of the thoughts expressed by the patient. Attempted to assist the patient gain insight into why an individual may stop communication. Used socratic questions to assist the patient gain insight into self. Challenged some of the thoughts expressed by the patient. Provided psychoeducation about how some people are in psychological turmoil that they decide to stop medical treatment. Explored what the patient wanted to do about the situation. Processed thoughts and emotions. Discussed next steps for counseling. Provided empathic statements. Assigned homework. Assessed for suicidal and homicidal ideation.   Homework: NA  Next Session: Emotional support.   Diagnosis: F33.1 major depressive affective disorder, recurrent, moderate and F41.1 generalized anxiety disorder  Plan:   Client Abilities: Friendly and easy to develop rapport  Client Preferences: ACT and CBT  Client statement of Needs: Coping skills and emotional support  Treatment Level:  Goals Alleviate depressive symptoms Recognize, accept, and cope with depressive feelings Develop healthy thinking patterns Develop healthy interpersonal relationships Reduce overall frequency, intensity, and duration of anxiety Stabilize anxiety level wile increasing ability to function Enhance ability to effectively cope with full variety of stressors Learn and implement coping skills that result in a reduction of anxiety   Objectives target date for all objectives is 04/07/2022 Verbalize an understanding of the cognitive, physiological, and behavioral components of anxiety Learning and implement calming skills to reduce overall anxiety Verbalize an understanding of the role that cognitive biases play in excessive irrational worry and persistent anxiety symptoms Identify, challenge, and replace  based fearful talk Learn and implement problem solving strategies Identify and engage in pleasant activities Learning and implement personal and interpersonal skills to reduce anxiety and improve interpersonal relationships Learn to accept limitations in life and commit to tolerating, rather than avoiding, unpleasant emotions while accomplishing meaningful goals Identify major life conflicts from the past and present that form the basis for present anxiety Maintain involvement in work, family, and social activities Reestablish a consistent sleep-wake cycle Cooperate with a medical evaluation  Cooperate with a medication evaluation by a physician Verbalize an accurate understanding of depression Verbalize an understanding of the treatment Identify and replace thoughts that support depression Learn and implement behavioral strategies Verbalize an understanding and resolution of current interpersonal problems Learn and implement problem solving and decision making skills Learn and implement conflict resolution skills to resolve interpersonal problems Verbalize an understanding of healthy and unhealthy emotions verbalize insight into how past relationships may be influence current experiences with depression Use mindfulness and acceptance strategies and increase value based behavior  Increase hopeful statements about the future.  Interventions Engage the patient in behavioral activation Use instruction, modeling, and role-playing to build the client's general social, communication, and/or conflict resolution skills Use Acceptance and Commitment Therapy to help client accept uncomfortable realities in order to accomplish value-consistent goals Reinforce the client's insight into the role of his/her past emotional pain and present anxiety  Support the client in following through with work, family, and social activities Teach and implement sleep hygiene practices  Refer the patient to a physician  for a psychotropic medication consultation Monito the clint's psychotropic medication compliance Discuss how anxiety typically involves excessive worry, various bodily expressions of tension, and avoidance of what is threatening that interact to maintain the problem  Teach the patient relaxation skills Assign the patient homework Discuss examples demonstrating that unrealistic worry overestimates the probability of threats and underestimates patient's ability  Assist the patient in analyzing his or her worries Help patient understand that avoidance is reinforcing  Consistent with treatment model, discuss how change in cognitive, behavioral, and interpersonal can help client alleviate depression CBT Behavioral activation help the client explore the relationship, nature of the dispute,  Help the client develop new interpersonal skills and relationships Conduct Problem solving therapy Teach conflict resolution skills Use a process-experiential approach Conduct TLDP Conduct ACT Evaluate need for psychotropic medication Monitor adherence to medication   The patient and clinician reviewed the treatment plan on 04/26/2021. The patient approved of the treatment.  Conception Chancy, PsyD

## 2022-01-16 ENCOUNTER — Inpatient Hospital Stay (HOSPITAL_COMMUNITY): Payer: Medicare Other | Attending: Hematology

## 2022-01-16 ENCOUNTER — Other Ambulatory Visit (HOSPITAL_COMMUNITY): Payer: Self-pay

## 2022-01-16 DIAGNOSIS — R634 Abnormal weight loss: Secondary | ICD-10-CM | POA: Diagnosis not present

## 2022-01-16 DIAGNOSIS — Z1509 Genetic susceptibility to other malignant neoplasm: Secondary | ICD-10-CM | POA: Diagnosis not present

## 2022-01-16 DIAGNOSIS — C7951 Secondary malignant neoplasm of bone: Secondary | ICD-10-CM | POA: Diagnosis present

## 2022-01-16 DIAGNOSIS — R5383 Other fatigue: Secondary | ICD-10-CM | POA: Insufficient documentation

## 2022-01-16 DIAGNOSIS — C61 Malignant neoplasm of prostate: Secondary | ICD-10-CM | POA: Diagnosis not present

## 2022-01-16 DIAGNOSIS — E559 Vitamin D deficiency, unspecified: Secondary | ICD-10-CM

## 2022-01-16 DIAGNOSIS — Z79899 Other long term (current) drug therapy: Secondary | ICD-10-CM | POA: Diagnosis not present

## 2022-01-16 DIAGNOSIS — I10 Essential (primary) hypertension: Secondary | ICD-10-CM | POA: Diagnosis not present

## 2022-01-16 DIAGNOSIS — Z923 Personal history of irradiation: Secondary | ICD-10-CM | POA: Insufficient documentation

## 2022-01-16 LAB — COMPREHENSIVE METABOLIC PANEL WITH GFR
ALT: 15 U/L (ref 0–44)
AST: 25 U/L (ref 15–41)
Albumin: 4.2 g/dL (ref 3.5–5.0)
Alkaline Phosphatase: 53 U/L (ref 38–126)
Anion gap: 5 (ref 5–15)
BUN: 12 mg/dL (ref 8–23)
CO2: 29 mmol/L (ref 22–32)
Calcium: 9.3 mg/dL (ref 8.9–10.3)
Chloride: 105 mmol/L (ref 98–111)
Creatinine, Ser: 1.03 mg/dL (ref 0.61–1.24)
GFR, Estimated: >60 mL/min
Glucose, Bld: 98 mg/dL (ref 70–99)
Potassium: 4 mmol/L (ref 3.5–5.1)
Sodium: 139 mmol/L (ref 135–145)
Total Bilirubin: 0.9 mg/dL (ref 0.3–1.2)
Total Protein: 7.1 g/dL (ref 6.5–8.1)

## 2022-01-16 LAB — CBC WITH DIFFERENTIAL/PLATELET
Abs Immature Granulocytes: 0.01 10*3/uL (ref 0.00–0.07)
Basophils Absolute: 0 10*3/uL (ref 0.0–0.1)
Basophils Relative: 1 %
Eosinophils Absolute: 0.1 10*3/uL (ref 0.0–0.5)
Eosinophils Relative: 3 %
HCT: 47.4 % (ref 39.0–52.0)
Hemoglobin: 15.5 g/dL (ref 13.0–17.0)
Immature Granulocytes: 0 %
Lymphocytes Relative: 20 %
Lymphs Abs: 0.9 10*3/uL (ref 0.7–4.0)
MCH: 32.9 pg (ref 26.0–34.0)
MCHC: 32.7 g/dL (ref 30.0–36.0)
MCV: 100.6 fL — ABNORMAL HIGH (ref 80.0–100.0)
Monocytes Absolute: 0.4 10*3/uL (ref 0.1–1.0)
Monocytes Relative: 10 %
Neutro Abs: 2.7 10*3/uL (ref 1.7–7.7)
Neutrophils Relative %: 66 %
Platelets: 264 10*3/uL (ref 150–400)
RBC: 4.71 MIL/uL (ref 4.22–5.81)
RDW: 13.3 % (ref 11.5–15.5)
WBC: 4.2 10*3/uL (ref 4.0–10.5)
nRBC: 0 % (ref 0.0–0.2)

## 2022-01-16 LAB — PSA: Prostatic Specific Antigen: 0.41 ng/mL (ref 0.00–4.00)

## 2022-01-17 LAB — TESTOSTERONE: Testosterone: 38 ng/dL — ABNORMAL LOW (ref 264–916)

## 2022-01-18 NOTE — Telephone Encounter (Signed)
Received fax on 11/14/21 from [employer] [place]) that patients application was approved until 08/06/22.  Patient has 2 grants to help cover his copay.  If he exhausts these grants, [facility] will be able to assist him then.  Nathan Smith Patient [place] Phone [phone] Fax [phone]

## 2022-01-23 ENCOUNTER — Encounter (HOSPITAL_COMMUNITY): Payer: Self-pay | Admitting: Hematology

## 2022-01-23 ENCOUNTER — Inpatient Hospital Stay (HOSPITAL_BASED_OUTPATIENT_CLINIC_OR_DEPARTMENT_OTHER): Payer: Medicare Other | Admitting: Hematology

## 2022-01-23 VITALS — BP 158/81 | HR 62 | Temp 98.0°F | Resp 18 | Ht 73.0 in | Wt 145.5 lb

## 2022-01-23 DIAGNOSIS — E559 Vitamin D deficiency, unspecified: Secondary | ICD-10-CM | POA: Diagnosis not present

## 2022-01-23 DIAGNOSIS — C61 Malignant neoplasm of prostate: Secondary | ICD-10-CM | POA: Diagnosis not present

## 2022-01-23 NOTE — Patient Instructions (Signed)
[facility] at [facility] ?Discharge Instructions ? ?You were seen and examined today by Dr. Katragadda. ? ?Dr. Katragadda discussed your most recent lab work and everything looks good. ? ?Follow-up as scheduled in 3 months. ? ? ? ?Thank you for choosing [facility] at [facility] to provide your oncology and hematology care.  To afford each patient quality time with our provider, please arrive at least 15 minutes before your scheduled appointment time.  ? ?If you have a lab appointment with the Cancer Center please come in thru the Main Entrance and check in at the main information desk. ? ?You need to re-schedule your appointment should you arrive 10 or more minutes late.  We strive to give you quality time with our providers, and arriving late affects you and other patients whose appointments are after yours.  Also, if you no show three or more times for appointments you may be dismissed from the clinic at the providers discretion.     ?Again, thank you for choosing [facility].  Our hope is that these requests will decrease the amount of time that you wait before being seen by our physicians.       ?_____________________________________________________________ ? ?Should you have questions after your visit to [facility], please contact our office at [phone] and follow the prompts.  Our office hours are 8:00 a.m. and 4:30 p.m. Monday - Friday.  Please note that voicemails left after 4:00 p.m. may not be returned until the following business day.  We are closed weekends and major holidays.  You do have access to a nurse 24-7, just call the main number to the clinic [phone] and do not press any options, hold on the line and a nurse will answer the phone.   ? ?For prescription refill requests, have your pharmacy contact our office and allow 72 hours.   ? ?Due to Covid, you will need to wear a mask upon entering the hospital. If you do  not have a mask, a mask will be given to you at the Main Entrance upon arrival. For doctor visits, patients may have 1 support person age 18 or older with them. For treatment visits, patients can not have anyone with them due to social distancing guidelines and our immunocompromised population.  ? ?  ?

## 2022-01-23 NOTE — Progress Notes (Signed)
[place], [place] [address]   CLINIC:  Medical Oncology/Hematology  PCP:  Patient, No Pcp Per None None   REASON FOR VISIT:  Follow-up for castration resistant prostate cancer  PRIOR THERAPY: External beam radiation from 05/27/2013 to 07/28/2013  NGS Results: not done  CURRENT THERAPY: Lupron every 6 months; enzalutamide 80 mg QD  BRIEF ONCOLOGIC HISTORY:  Oncology History  Prostate cancer ([place])  09/24/2012 Initial Diagnosis   Prostate cancer (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 Testing was completed through the Common Hereditary Cancers panel and Prostate Cancer HRR panel offered by [facility] laboratories. The report date is 08/24/2020.   The Common Hereditary Cancers Panel offered by [facility] includes sequencing and/or deletion duplication testing of the following 47 genes: APC, ATM, AXIN2, BARD1, BMPR1A, BRCA1, BRCA2, BRIP1, CDH1, CDK4, CDKN2A (p14ARF), CDKN2A (p16INK4a), CHEK2, CTNNA1, DICER1, EPCAM (Deletion/duplication testing only), GREM1 (promoter region deletion/duplication testing only), KIT, MEN1, MLH1, MSH2, MSH3, MSH6, MUTYH, NBN, NF1, NTHL1, PALB2, PDGFRA, PMS2, POLD1, POLE, PTEN, RAD50, RAD51C, RAD51D, SDHB, SDHC, SDHD, SMAD4, SMARCA4. STK11, TP53, TSC1, TSC2, and VHL.  The following genes were evaluated for sequence changes only: SDHA and HOXB13 c.251G>A variant only. The Prostate Cancer HRR Panel offered by [facility] includes sequencing and/or deletion/duplication analysis of the following 10 genes: ATM, BARD1, BRCA1, BRCA2, BRIP1, CHEK2, FANCL, PALB2, RAD51C, and RAD51D.     CANCER STAGING:  Cancer Staging   Prostate cancer [facility]) Staging form: Prostate, AJCC 7th Edition - Clinical stage from 12/26/2019: Stage IV (yTX, N1, M1b, PSA: 10 to 19, Gleason 8-10) - Signed by Derek Jack, MD on 12/26/2019   INTERVAL HISTORY:  [name], a 71 y.o. male, returns for routine follow-up of his castration resistant prostate cancer. [name] was last seen on 10/17/2021.   Today he reports feeling good. He takes one 80 mg tablet daily. He has lost 2 lbs since his last visit. He has reintroduced meat into his diet. He reports his hot flashes are tolerable; he was prescribed Megace but does not wish to take it due to concern for side effects. He denies bleeding issues, and he denies fevers and infections within the last 6 months.   REVIEW OF SYSTEMS:  Review of Systems  Constitutional:  Positive for unexpected weight change (-2 lbs). Negative for appetite change, fatigue and fever.  HENT:   Negative for nosebleeds.   Respiratory:  Negative for hemoptysis.   Gastrointestinal:  Positive for nausea. Negative for blood in stool.  Endocrine: Positive for hot flashes.  Genitourinary:  Negative for hematuria.   Psychiatric/Behavioral:  Positive for depression. The patient is nervous/anxious.   All other systems reviewed and are negative.   PAST MEDICAL/SURGICAL HISTORY:  Past Medical History:  Diagnosis Date   Anxiety    Bilateral hydronephrosis    Bladder incontinence    night time,  past 2 months   BPH (benign prostatic hypertrophy) with urinary obstruction    Depression    ED (erectile dysfunction)    H/O ascites    trace   Hypertension    Prostate cancer ([place]) 09/24/12   gleason 4+3=7., & 4+4=8,PSA=67.30, volume=33.7cc   Past Surgical  History:  Procedure Laterality Date   PROSTATE BIOPSY  09/24/12   Adenocarcinoma   PROSTATE BIOPSY N/A 12/20/2017   Procedure: BIOPSY TRANSRECTAL ULTRASONIC PROSTATE (TUBP);  Surgeon: Franchot Gallo, MD;  Location: [facility];  Service: Urology;   Laterality: N/A;   TONSILLECTOMY      SOCIAL HISTORY:  Social History   Socioeconomic History   Marital status: Single    Spouse name: Not on file   Number of children: 0   Years of education: Not on file   Highest education level: Not on file  Occupational History   Occupation: [name]    Employer: [employer]  Tobacco Use   Smoking status: Never   Smokeless tobacco: Never  Vaping Use   Vaping Use: Never used  Substance and Sexual Activity   Alcohol use: Yes    Alcohol/week: 2.0 standard drinks of alcohol    Types: 2 Cans of beer per week    Comment: 3 per day, beer/wine , hx etoh abuse years ago   Drug use: No    Comment: hx   Sexual activity: Yes    Birth control/protection: Condom  Other Topics Concern   Not on file  Social History Narrative   Not on file   Social Determinants of Health   Financial Resource Strain: Low Risk  (12/08/2019)   Overall Financial Resource Strain (CARDIA)    Difficulty of Paying Living Expenses: Not very hard  Food Insecurity: No Food Insecurity (12/08/2019)   Hunger Vital Sign    Worried About Running Out of Food in the Last Year: Never true    Ran Out of Food in the Last Year: Never true  Transportation Needs: No Transportation Needs (12/08/2019)   PRAPARE - Hydrologist (Medical): No    Lack of Transportation (Non-Medical): No  Physical Activity: Sufficiently Active (12/08/2019)   Exercise Vital Sign    Days of Exercise per Week: 6 days    Minutes of Exercise per Session: 60 min  Stress: Stress Concern Present (12/08/2019)   [place]    Feeling of Stress : To some extent  Social Connections: Unknown (12/08/2019)   Social Connection and Isolation Panel [NHANES]    Frequency of Communication with Friends and Family: Twice a week    Frequency of Social Gatherings with Friends and Family: Once a week    Attends Religious  Services: More than 4 times per year    Active Member of [employer] or Organizations: No    Attends Music therapist: More than 4 times per year    Marital Status: Not on file  Intimate Partner Violence: Not At Risk (12/08/2019)   Humiliation, Afraid, Rape, and Kick questionnaire    Fear of Current or Ex-Partner: No    Emotionally Abused: No    Physically Abused: No    Sexually Abused: No    FAMILY HISTORY:  Family History  Problem Relation Age of Onset   Depression Mother    Dementia Mother    Anxiety disorder Mother    Prostate cancer Father        unconfirmed diagnosis    CURRENT MEDICATIONS:  Current Outpatient Medications  Medication Sig Dispense Refill   Ascorbic Acid (VITAMIN C) 1000 MG tablet Take 1,000 mg by mouth daily.     b complex vitamins capsule Take 1 capsule by mouth daily.     Bacillus Coagulans-Inulin (PROBIOTIC) 1-250 BILLION-MG CAPS Take  by mouth daily.      Cholecalciferol 125 MCG (5000 UT) capsule Take 5,000 Units by mouth daily.      enzalutamide (XTANDI) 80 MG tablet Take 1 tablet (80 mg total) by mouth daily. 30 tablet 3   Ginseng 100 MG CAPS Take by mouth daily.      L-ARGININE PO Take 1,000 mg by mouth daily.      losartan (COZAAR) 25 MG tablet TAKE 1 TABLET(25 MG) BY MOUTH DAILY 90 tablet 3   megestrol (MEGACE) 20 MG tablet Take 1 tablet (20 mg total) by mouth 2 (two) times daily. 60 tablet 11   melatonin 1 MG TABS tablet Take 2 mg by mouth at bedtime.      methylphenidate (RITALIN) 5 MG tablet Take 0.5 tablets (2.5 mg total) by mouth daily as needed. 10 tablet 0   Probiotic Product (ALIGN) 4 MG CAPS Take 1 capsule by mouth daily.      tamsulosin (FLOMAX) 0.4 MG CAPS capsule TAKE 1 CAPSULE(0.4 MG) BY MOUTH DAILY 90 capsule 3   zinc gluconate 50 MG tablet Take 50 mg by mouth daily.     No current facility-administered medications for this visit.   Facility-Administered Medications Ordered in Other Visits  Medication Dose Route Frequency  Provider Last Rate Last Admin   fulvestrant (FASLODEX) 250 MG/5ML injection             ALLERGIES:  No Known Allergies  PHYSICAL EXAM:  Performance status (ECOG): 0 - Asymptomatic  Vitals:   01/23/22 1518  BP: (!) 158/81  Pulse: 62  Resp: 18  Temp: 98 F (36.7 C)  SpO2: 100%   Wt Readings from Last 3 Encounters:  01/23/22 145 lb 8 oz (66 kg)  10/17/21 147 lb 12.8 oz (67 kg)  08/30/21 155 lb (70.3 kg)   Physical Exam Vitals reviewed.  Constitutional:      Appearance: Normal appearance.  Cardiovascular:     Rate and Rhythm: Normal rate and regular rhythm.     Pulses: Normal pulses.     Heart sounds: Normal heart sounds.  Pulmonary:     Effort: Pulmonary effort is normal.     Breath sounds: Normal breath sounds.  Musculoskeletal:     Right lower leg: No edema.     Left lower leg: No edema.  Neurological:     General: No focal deficit present.     Mental Status: He is alert and oriented to person, place, and time.  Psychiatric:        Mood and Affect: Mood normal.        Behavior: Behavior normal.      LABORATORY DATA:  I have reviewed the labs as listed.     Latest Ref Rng & Units 01/16/2022    1:38 PM 10/10/2021    3:24 PM 07/04/2021    1:51 PM  CBC  WBC 4.0 - 10.5 K/uL 4.2  5.0  4.1   Hemoglobin 13.0 - 17.0 g/dL 15.5  14.7  14.7   Hematocrit 39.0 - 52.0 % 47.4  44.1  44.6   Platelets 150 - 400 K/uL 264  255  260       Latest Ref Rng & Units 01/16/2022    1:38 PM 10/10/2021    3:24 PM 07/04/2021    1:51 PM  CMP  Glucose 70 - 99 mg/dL 98  102  99   BUN 8 - 23 mg/dL _0 Creatinine 0.61 - 1.24  mg/dL 1.03  0.80  0.98   Sodium 135 - 145 mmol/L 139  139  139   Potassium 3.5 - 5.1 mmol/L 4.0  4.1  4.4   Chloride 98 - 111 mmol/L 105  103  102   CO2 22 - 32 mmol/L _0 Calcium 8.9 - 10.3 mg/dL 9.3  9.2  9.5   Total Protein 6.5 - 8.1 g/dL 7.1  6.9  6.7   Total Bilirubin 0.3 - 1.2 mg/dL 0.9  0.7  0.9   Alkaline Phos 38 - 126 U/L 53  57  52    AST 15 - 41 U/L _1 ALT 0 - 44 U/L _2 DIAGNOSTIC IMAGING:  I have independently reviewed the scans and discussed with the patient. No results found.   ASSESSMENT:  1.  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 -External beam radiation with 2 years of Lupron from 05/27/2013 through 07/28/2013.  Received Lupron until 09/28/2014. -Rising PSA levels in December 2018, of 2.8.  Local recurrence was confirmed with biopsy. -Bone scan on 04/12/2018 did not show any evidence of metastatic disease. -He was evaluated by Dr. Iona Beard at [facility].  Bone scan and CT scan [facility] on 07/02/2018 did not show any evidence of metastatic disease.  Left para-aortic lymph node measures 8 mm.  Lupron started for nonmetastatic HSPC in November 2019 for rapid PSA doubling time of less than 3 months.  He had difficulty tolerating Lupron with irritability, low energy and fatigue. -PSA 5.7 (07/08/2019), PSA 11.1 (11/26/2019) with testosterone 18. -Fluciclovine PET CT scan on 12/23/2019 showed T11 meta stasis.  Asymmetric hypermetabolism within the prostate suspicious for residual disease.  Mild enlargement of 2 adjacent lower left para-aortic nodes, largest 8 mm with SUV of 3.4.  These nodes measured maximally 5 mm on PET scan on 02/25/2018. -Abiraterone 500 mg daily and prednisone from 01/03/2020 through 02/16/2020, discontinued secondary to elevated LFTs. -We will consider checking germline and somatic mutation testing. -Lupron 45 mg on 02/17/2020 at Dr. Alan Ripper office. -Enzalutamide 80 mg daily started on 03/10/2020.   PLAN:  1.  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 - He has lost about 1 and half pounds in the last 3 months.  Prior to that he lost 8 pounds in the preceding 3 months. - He has some hot flashes on and off.  Dr. Diona Fanti has given  him Megace but he was reluctant to take it. - I have reviewed labs from 01/16/2022.  Creatinine and LFTs are normal.  CBC is normal.  PSA has increased to 0.41 from 0.30 previously.  Testosterone also slightly increased to 38 from 24. - He would like to continue taking Lupron injection at our clinic.  He will come back end of next month for his next injection. - Continue enzalutamide 80 mg daily.  RTC 3 months for follow-up with repeat PSA and labs.   2.  Bone metastasis: - Continue calcium and vitamin D supplements.   3.  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 - He has refused Cologuard test.   4.  Hypertension: - Continue losartan 25 mg daily.   5.  Fatigue: - Continue taking 1/5 of Ritalin 5 mg tablet on Sundays.  Orders placed this encounter:  Orders Placed This Encounter  Procedures   PSA   Testosterone   CBC with Differential/Platelet   Comprehensive metabolic panel     Derek Jack, MD [place] [phone]   I, Thana Ates, am acting as a scribe for Dr. Derek Jack.  I, Derek Jack MD, have reviewed the above documentation for accuracy and completeness, and I agree with the above.

## 2022-01-25 ENCOUNTER — Encounter (HOSPITAL_COMMUNITY): Payer: Self-pay | Admitting: Hematology

## 2022-02-06 ENCOUNTER — Other Ambulatory Visit (HOSPITAL_COMMUNITY): Payer: Self-pay

## 2022-02-08 ENCOUNTER — Ambulatory Visit (INDEPENDENT_AMBULATORY_CARE_PROVIDER_SITE_OTHER): Payer: Medicare Other | Admitting: Psychologist

## 2022-02-08 DIAGNOSIS — F331 Major depressive disorder, recurrent, moderate: Secondary | ICD-10-CM | POA: Diagnosis not present

## 2022-02-08 DIAGNOSIS — F411 Generalized anxiety disorder: Secondary | ICD-10-CM | POA: Diagnosis not present

## 2022-02-08 NOTE — Progress Notes (Signed)
[facility] Counselor/Therapist Progress Note  Patient ID: Nathan Smith, MRN: [id number],    Date: 02/08/2022  Time Spent: 3:03 pm to 3:48 pm; total time: 45 minutes  This session was held via phone teletherapy due to the coronavirus risk at this time. The patient consented to phone teletherapy and was located at his home during this session. He is aware it is the responsibility of the patient to secure confidentiality on his end of the session. The provider was in a private home office for the duration of this session. Limits of confidentiality were discussed with the patient.   Treatment Type: Individual Therapy  Reported Symptoms: Depression. Questions related to marijuana and ketamine  Mental Status Exam: Appearance:  Well Groomed     Behavior: Appropriate  Motor: Normal  Speech/Language:  Normal Rate  Affect: Appropriate  Mood: normal  Thought process: normal  Thought content:   WNL  Sensory/Perceptual disturbances:   WNL  Orientation: oriented to person, place, time/date, and situation  Attention: Good  Concentration: Good  Memory: WNL  Fund of knowledge:  Good  Insight:   Fair  Judgment:  Fair  Impulse Control: Good   Risk Assessment: Danger to Self:  No Self-injurious Behavior: No Danger to Others: No Duty to Warn:no Physical Aggression / Violence:No  Access to Firearms a concern: No  Gang Involvement:No   Subjective: Patient described himself as okay while reflecting on events since the last session. He spent the session reflecting on whether or not to use ketamine. He processed thoughts and emotions about this option. He was agreeable to the plan discussed and following up. He denied suicidal and homicidal ideation.    Interventions: Worked on developing a therapeutic relationship with the patient using active listening and reflective statements. Provided emotional support using empathy and validation. Used summary statements. Praised the patient  for doing well. Reflected on events since the last session. Validated expressed thoughts and emotions. Provided psychoeducation about ketamine. Processed whether or not patient wanted to consider the option. Validated concerns. Used socratic questions to assist the patient. Encouraged patient to contact [facility].  Provided empathic statements. Assigned homework. Assessed for suicidal and homicidal ideation.   Homework: Contact [facility] about ketamine option.   Next Session: Emotional support.   Diagnosis: F33.1 major depressive affective disorder, recurrent, moderate and F41.1 generalized anxiety disorder  Plan:   Client Abilities: Friendly and easy to develop rapport  Client Preferences: ACT and CBT  Client statement of Needs: Coping skills and emotional support  Treatment Level:  Goals Alleviate depressive symptoms Recognize, accept, and cope with depressive feelings Develop healthy thinking patterns Develop healthy interpersonal relationships Reduce overall frequency, intensity, and duration of anxiety Stabilize anxiety level wile increasing ability to function Enhance ability to effectively cope with full variety of stressors Learn and implement coping skills that result in a reduction of anxiety   Objectives target date for all objectives is 04/07/2022 Verbalize an understanding of the cognitive, physiological, and behavioral components of anxiety Learning and implement calming skills to reduce overall anxiety Verbalize an understanding of the role that cognitive biases play in excessive irrational worry and persistent anxiety symptoms Identify, challenge, and replace based fearful talk Learn and implement problem solving strategies Identify and engage in pleasant activities Learning and implement personal and interpersonal skills to reduce anxiety and improve interpersonal relationships Learn to accept limitations in life and commit to tolerating, rather than avoiding,  unpleasant emotions while accomplishing meaningful goals Identify major life conflicts from the past and  present that form the basis for present anxiety Maintain involvement in work, family, and social activities Reestablish a consistent sleep-wake cycle Cooperate with a medical evaluation  Cooperate with a medication evaluation by a physician Verbalize an accurate understanding of depression Verbalize an understanding of the treatment Identify and replace thoughts that support depression Learn and implement behavioral strategies Verbalize an understanding and resolution of current interpersonal problems Learn and implement problem solving and decision making skills Learn and implement conflict resolution skills to resolve interpersonal problems Verbalize an understanding of healthy and unhealthy emotions verbalize insight into how past relationships may be influence current experiences with depression Use mindfulness and acceptance strategies and increase value based behavior  Increase hopeful statements about the future.  Interventions Engage the patient in behavioral activation Use instruction, modeling, and role-playing to build the client's general social, communication, and/or conflict resolution skills Use Acceptance and Commitment Therapy to help client accept uncomfortable realities in order to accomplish value-consistent goals Reinforce the client's insight into the role of his/her past emotional pain and present anxiety  Support the client in following through with work, family, and social activities Teach and implement sleep hygiene practices  Refer the patient to a physician for a psychotropic medication consultation Monito the clint's psychotropic medication compliance Discuss how anxiety typically involves excessive worry, various bodily expressions of tension, and avoidance of what is threatening that interact to maintain the problem  Teach the patient relaxation  skills Assign the patient homework Discuss examples demonstrating that unrealistic worry overestimates the probability of threats and underestimates patient's ability  Assist the patient in analyzing his or her worries Help patient understand that avoidance is reinforcing  Consistent with treatment model, discuss how change in cognitive, behavioral, and interpersonal can help client alleviate depression CBT Behavioral activation help the client explore the relationship, nature of the dispute,  Help the client develop new interpersonal skills and relationships Conduct Problem solving therapy Teach conflict resolution skills Use a process-experiential approach Conduct TLDP Conduct ACT Evaluate need for psychotropic medication Monitor adherence to medication   The patient and clinician reviewed the treatment plan on 04/26/2021. The patient approved of the treatment.  Conception Chancy, PsyD                Conception Chancy, PsyD

## 2022-02-14 ENCOUNTER — Other Ambulatory Visit (HOSPITAL_COMMUNITY): Payer: Self-pay | Admitting: Hematology

## 2022-02-14 DIAGNOSIS — C61 Malignant neoplasm of prostate: Secondary | ICD-10-CM

## 2022-02-15 ENCOUNTER — Other Ambulatory Visit (HOSPITAL_COMMUNITY): Payer: Self-pay

## 2022-02-16 ENCOUNTER — Encounter (HOSPITAL_COMMUNITY): Payer: Self-pay | Admitting: Hematology

## 2022-02-27 ENCOUNTER — Inpatient Hospital Stay (HOSPITAL_COMMUNITY): Payer: Medicare Other | Attending: Hematology

## 2022-02-27 VITALS — BP 167/83 | HR 65 | Temp 97.7°F | Resp 18 | Wt 146.4 lb

## 2022-02-27 DIAGNOSIS — Z79899 Other long term (current) drug therapy: Secondary | ICD-10-CM | POA: Diagnosis not present

## 2022-02-27 DIAGNOSIS — R634 Abnormal weight loss: Secondary | ICD-10-CM | POA: Diagnosis not present

## 2022-02-27 DIAGNOSIS — C7951 Secondary malignant neoplasm of bone: Secondary | ICD-10-CM | POA: Insufficient documentation

## 2022-02-27 DIAGNOSIS — C61 Malignant neoplasm of prostate: Secondary | ICD-10-CM | POA: Insufficient documentation

## 2022-02-27 DIAGNOSIS — I10 Essential (primary) hypertension: Secondary | ICD-10-CM | POA: Diagnosis present

## 2022-02-27 DIAGNOSIS — R5383 Other fatigue: Secondary | ICD-10-CM | POA: Insufficient documentation

## 2022-02-27 DIAGNOSIS — Z1509 Genetic susceptibility to other malignant neoplasm: Secondary | ICD-10-CM | POA: Diagnosis not present

## 2022-02-27 DIAGNOSIS — Z923 Personal history of irradiation: Secondary | ICD-10-CM | POA: Insufficient documentation

## 2022-02-27 MED ORDER — LEUPROLIDE ACETATE (6 MONTH) 45 MG ~~LOC~~ KIT
45.0000 mg | PACK | Freq: Once | SUBCUTANEOUS | Status: AC
  Administered 2022-02-27: 45 mg via SUBCUTANEOUS
  Filled 2022-02-27: qty 45

## 2022-02-27 NOTE — Patient Instructions (Signed)
[facility]  Discharge Instructions: Thank you for choosing [facility] to provide your oncology and hematology care.  If you have a lab appointment with the [facility], please come in thru the Main Entrance and check in at the main information desk.  Wear comfortable clothing and clothing appropriate for easy access to any Portacath or PICC line.   We strive to give you quality time with your provider. You may need to reschedule your appointment if you arrive late (15 or more minutes).  Arriving late affects you and other patients whose appointments are after yours.  Also, if you miss three or more appointments without notifying the office, you may be dismissed from the clinic at the provider's discretion.      For prescription refill requests, have your pharmacy contact our office and allow 72 hours for refills to be completed.    Today you received the following chemotherapy and/or immunotherapy agents eligard      To help prevent nausea and vomiting after your treatment, we encourage you to take your nausea medication as directed.  BELOW ARE SYMPTOMS THAT SHOULD BE REPORTED IMMEDIATELY: *FEVER GREATER THAN 100.4 F (38 C) OR HIGHER *CHILLS OR SWEATING *NAUSEA AND VOMITING THAT IS NOT CONTROLLED WITH YOUR NAUSEA MEDICATION *UNUSUAL SHORTNESS OF BREATH *UNUSUAL BRUISING OR BLEEDING *URINARY PROBLEMS (pain or burning when urinating, or frequent urination) *BOWEL PROBLEMS (unusual diarrhea, constipation, pain near the anus) TENDERNESS IN MOUTH AND THROAT WITH OR WITHOUT PRESENCE OF ULCERS (sore throat, sores in mouth, or a toothache) UNUSUAL RASH, SWELLING OR PAIN  UNUSUAL VAGINAL DISCHARGE OR ITCHING   Items with * indicate a potential emergency and should be followed up as soon as possible or go to the Emergency Department if any problems should occur.  Please show the CHEMOTHERAPY ALERT CARD or IMMUNOTHERAPY ALERT CARD at check-in to the Emergency  Department and triage nurse.  Should you have questions after your visit or need to cancel or reschedule your appointment, please contact [facility] [phone]  and follow the prompts.  Office hours are 8:00 a.m. to 4:30 p.m. Monday - Friday. Please note that voicemails left after 4:00 p.m. may not be returned until the following business day.  We are closed weekends and major holidays. You have access to a nurse at all times for urgent questions. Please call the main number to the clinic [phone] and follow the prompts.  For any non-urgent questions, you may also contact your provider using MyChart. We now offer e-Visits for anyone 53 and older to request care online for non-urgent symptoms. For details visit [email or web address].   Also download the MyChart app! Go to the app store, search "MyChart", open the app, select [facility], and log in with your MyChart username and password.  Masks are optional in the cancer centers. If you would like for your care team to wear a mask while they are taking care of you, please let them know. For doctor visits, patients may have with them one support person who is at least 69 years old. At this time, visitors are not allowed in the infusion area.

## 2022-02-27 NOTE — Progress Notes (Signed)
Eligard injection given per orders. Patient tolerated it well without problems. Vitals stable and discharged home from clinic ambulatory. Follow up as scheduled.

## 2022-03-07 ENCOUNTER — Ambulatory Visit: Payer: PRIVATE HEALTH INSURANCE | Admitting: Urology

## 2022-03-07 ENCOUNTER — Ambulatory Visit (INDEPENDENT_AMBULATORY_CARE_PROVIDER_SITE_OTHER): Payer: Medicare Other | Admitting: Psychologist

## 2022-03-07 DIAGNOSIS — F331 Major depressive disorder, recurrent, moderate: Secondary | ICD-10-CM | POA: Diagnosis not present

## 2022-03-07 DIAGNOSIS — F411 Generalized anxiety disorder: Secondary | ICD-10-CM | POA: Diagnosis not present

## 2022-03-07 NOTE — Progress Notes (Signed)
[place] Counselor/Therapist Progress Note  Patient ID: Nathan Smith, MRN: [id number],    Date: 03/07/2022  Time Spent: 3:08 pm to 3:46 pm; total time: 38 minutes  This session was held via phone teletherapy due to the coronavirus risk at this time. The patient consented to phone teletherapy and was located at his home during this session. He is aware it is the responsibility of the patient to secure confidentiality on his end of the session. The provider was in a private home office for the duration of this session. Limits of confidentiality were discussed with the patient.   Treatment Type: Individual Therapy  Reported Symptoms: Uncomfortable with experiencing emotions.   Mental Status Exam: Appearance:  Well Groomed     Behavior: Appropriate  Motor: Normal  Speech/Language:  Normal Rate  Affect: Appropriate  Mood: normal  Thought process: normal  Thought content:   WNL  Sensory/Perceptual disturbances:   WNL  Orientation: oriented to person, place, time/date, and situation  Attention: Good  Concentration: Good  Memory: WNL  Fund of knowledge:  Good  Insight:   Fair  Judgment:  Fair  Impulse Control: Good   Risk Assessment: Danger to Self:  No Self-injurious Behavior: No Danger to Others: No Duty to Warn:no Physical Aggression / Violence:No  Access to Firearms a concern: No  Gang Involvement:No   Subjective: Patient described himself as doing well and talked about what has helped. From there, he spent time asking about psychodelic medication and whether it would be efficacious for him to take. From there, he talked about how he is uncomfortable experiencing some of the emotions he experiences.He was agreeable to the plan discussed and following up. He denied suicidal and homicidal ideation.    Interventions: Worked on developing a therapeutic relationship with the patient using active listening and reflective statements. Provided emotional support using  empathy and validation. Used reflective statements. Provided emotional support to the patient. Praised the patient for doing better and explored what has assisted the patient. Processed how the patient was feeling about upcoming appointment with [facility]. Provided psychoeducation a bout psychodelic medication. Encouraged the patient to still meet with the provider at [facility]. Reflected on the intent of taking psychodelic medication. Identified not wanting to experience emotions. Validated concerns. Processed some of those fears. Explored the idea of sitting with emotions and processing what those emotions may be trying to tell the patient. Discussed next steps for counseling. Assessed for suicidal and homicidal ideation.   Homework: NA  Next Session: Emotional support. Process emotions after sitting with them.   Diagnosis: F33.1 major depressive affective disorder, recurrent, moderate and F41.1 generalized anxiety disorder  Plan:   Client Abilities: Friendly and easy to develop rapport  Client Preferences: ACT and CBT  Client statement of Needs: Coping skills and emotional support  Treatment Level:  Goals Alleviate depressive symptoms Recognize, accept, and cope with depressive feelings Develop healthy thinking patterns Develop healthy interpersonal relationships Reduce overall frequency, intensity, and duration of anxiety Stabilize anxiety level wile increasing ability to function Enhance ability to effectively cope with full variety of stressors Learn and implement coping skills that result in a reduction of anxiety   Objectives target date for all objectives is 04/07/2022 Verbalize an understanding of the cognitive, physiological, and behavioral components of anxiety Learning and implement calming skills to reduce overall anxiety Verbalize an understanding of the role that cognitive biases play in excessive irrational worry and persistent anxiety symptoms Identify, challenge,  and replace based fearful talk Learn  and implement problem solving strategies Identify and engage in pleasant activities Learning and implement personal and interpersonal skills to reduce anxiety and improve interpersonal relationships Learn to accept limitations in life and commit to tolerating, rather than avoiding, unpleasant emotions while accomplishing meaningful goals Identify major life conflicts from the past and present that form the basis for present anxiety Maintain involvement in work, family, and social activities Reestablish a consistent sleep-wake cycle Cooperate with a medical evaluation  Cooperate with a medication evaluation by a physician Verbalize an accurate understanding of depression Verbalize an understanding of the treatment Identify and replace thoughts that support depression Learn and implement behavioral strategies Verbalize an understanding and resolution of current interpersonal problems Learn and implement problem solving and decision making skills Learn and implement conflict resolution skills to resolve interpersonal problems Verbalize an understanding of healthy and unhealthy emotions verbalize insight into how past relationships may be influence current experiences with depression Use mindfulness and acceptance strategies and increase value based behavior  Increase hopeful statements about the future.  Interventions Engage the patient in behavioral activation Use instruction, modeling, and role-playing to build the client's general social, communication, and/or conflict resolution skills Use Acceptance and Commitment Therapy to help client accept uncomfortable realities in order to accomplish value-consistent goals Reinforce the client's insight into the role of his/her past emotional pain and present anxiety  Support the client in following through with work, family, and social activities Teach and implement sleep hygiene practices  Refer the patient to  a physician for a psychotropic medication consultation Monito the clint's psychotropic medication compliance Discuss how anxiety typically involves excessive worry, various bodily expressions of tension, and avoidance of what is threatening that interact to maintain the problem  Teach the patient relaxation skills Assign the patient homework Discuss examples demonstrating that unrealistic worry overestimates the probability of threats and underestimates patient's ability  Assist the patient in analyzing his or her worries Help patient understand that avoidance is reinforcing  Consistent with treatment model, discuss how change in cognitive, behavioral, and interpersonal can help client alleviate depression CBT Behavioral activation help the client explore the relationship, nature of the dispute,  Help the client develop new interpersonal skills and relationships Conduct Problem solving therapy Teach conflict resolution skills Use a process-experiential approach Conduct TLDP Conduct ACT Evaluate need for psychotropic medication Monitor adherence to medication   The patient and clinician reviewed the treatment plan on 04/26/2021. The patient approved of the treatment.  Conception Chancy, PsyD                              Conception Chancy, PsyD

## 2022-03-09 ENCOUNTER — Other Ambulatory Visit (HOSPITAL_COMMUNITY): Payer: Self-pay

## 2022-03-16 ENCOUNTER — Other Ambulatory Visit (HOSPITAL_COMMUNITY): Payer: Self-pay

## 2022-03-23 ENCOUNTER — Encounter (HOSPITAL_COMMUNITY): Payer: Self-pay | Admitting: Hematology

## 2022-03-23 ENCOUNTER — Ambulatory Visit: Payer: Medicare Other | Admitting: Behavioral Health

## 2022-03-23 ENCOUNTER — Other Ambulatory Visit (HOSPITAL_COMMUNITY): Payer: Self-pay

## 2022-04-04 ENCOUNTER — Other Ambulatory Visit (HOSPITAL_COMMUNITY): Payer: Self-pay | Admitting: Hematology

## 2022-04-04 ENCOUNTER — Other Ambulatory Visit (HOSPITAL_COMMUNITY): Payer: Self-pay

## 2022-04-04 MED ORDER — ENZALUTAMIDE 80 MG PO TABS
80.0000 mg | ORAL_TABLET | Freq: Every day | ORAL | 3 refills | Status: DC
  Filled 2022-04-12: qty 30, 30d supply, fill #0
  Filled 2022-05-04: qty 30, 30d supply, fill #1
  Filled 2022-05-30: qty 30, 30d supply, fill #2
  Filled 2022-06-30: qty 30, 30d supply, fill #3

## 2022-04-06 ENCOUNTER — Other Ambulatory Visit (HOSPITAL_COMMUNITY): Payer: Self-pay

## 2022-04-12 ENCOUNTER — Other Ambulatory Visit (HOSPITAL_COMMUNITY): Payer: Self-pay

## 2022-04-18 ENCOUNTER — Ambulatory Visit (INDEPENDENT_AMBULATORY_CARE_PROVIDER_SITE_OTHER): Payer: Medicare Other | Admitting: Psychologist

## 2022-04-18 DIAGNOSIS — F331 Major depressive disorder, recurrent, moderate: Secondary | ICD-10-CM | POA: Diagnosis not present

## 2022-04-18 DIAGNOSIS — F411 Generalized anxiety disorder: Secondary | ICD-10-CM | POA: Diagnosis not present

## 2022-04-18 NOTE — Progress Notes (Signed)
[place] Counselor/Therapist Progress Note  Patient ID: Nathan Smith, MRN: [id number],    Date: 04/18/2022  Time Spent: 3:05 pm to 3:43 pm; total time: 38 minutes  This session was held via phone teletherapy due to the coronavirus risk at this time. The patient consented to phone teletherapy and was located at his home during this session. He is aware it is the responsibility of the patient to secure confidentiality on his end of the session. The provider was in a private home office for the duration of this session. Limits of confidentiality were discussed with the patient.   Treatment Type: Individual Therapy  Reported Symptoms: Doing better  Mental Status Exam: Appearance:  Well Groomed     Behavior: Appropriate  Motor: Normal  Speech/Language:  Normal Rate  Affect: Appropriate  Mood: normal  Thought process: normal  Thought content:   WNL  Sensory/Perceptual disturbances:   WNL  Orientation: oriented to person, place, time/date, and situation  Attention: Good  Concentration: Good  Memory: WNL  Fund of knowledge:  Good  Insight:   Fair  Judgment:  Fair  Impulse Control: Good   Risk Assessment: Danger to Self:  No Self-injurious Behavior: No Danger to Others: No Duty to Warn:no Physical Aggression / Violence:No  Access to Firearms a concern: No  Gang Involvement:No   Subjective: Patient described himself as doing well and voiced that he canceled the appointment with [facility]. From there, he spent the session reflecting on the different emotions he experiences. He reflected on the idea of sitting with those emotions and what they may be trying to tell him. He was agreeable to homework and following up. He denied suicidal and homicidal ideation.    Interventions: Worked on developing a therapeutic relationship with the patient using active listening and reflective statements. Provided emotional support using empathy and validation. Used reflective  statements. Provided emotional support to the patient. Reflected on events since the last session. Normalized and validated thoughts and emotions. Reviewed reasons for canceling the appointment. Processed thoughts. Used socratic questions to assist the patient gain insight into self. Challenged some of the thoughts expressed. Reflected on the idea of sitting with emotions. Began to introduce the idea that emotions may be telling the patient something. Provided psychoeducation about podcast episode. Assigned homework. Assessed for suicidal and homicidal ideation.   Homework: Listen to podcast episode  Next Session: Review homework and emotional support.   Diagnosis: F33.1 major depressive affective disorder, recurrent, moderate and F41.1 generalized anxiety disorder  Plan:   Client Abilities: Friendly and easy to develop rapport  Client Preferences: ACT and CBT  Client statement of Needs: Coping skills and emotional support  Treatment Level:  Goals Alleviate depressive symptoms Recognize, accept, and cope with depressive feelings Develop healthy thinking patterns Develop healthy interpersonal relationships Reduce overall frequency, intensity, and duration of anxiety Stabilize anxiety level wile increasing ability to function Enhance ability to effectively cope with full variety of stressors Learn and implement coping skills that result in a reduction of anxiety   Objectives target date for all objectives is 04/08/2023 Verbalize an understanding of the cognitive, physiological, and behavioral components of anxiety Learning and implement calming skills to reduce overall anxiety Verbalize an understanding of the role that cognitive biases play in excessive irrational worry and persistent anxiety symptoms Identify, challenge, and replace based fearful talk Learn and implement problem solving strategies Identify and engage in pleasant activities Learning and implement personal and  interpersonal skills to reduce anxiety and improve interpersonal  relationships Learn to accept limitations in life and commit to tolerating, rather than avoiding, unpleasant emotions while accomplishing meaningful goals Identify major life conflicts from the past and present that form the basis for present anxiety Maintain involvement in work, family, and social activities Reestablish a consistent sleep-wake cycle Cooperate with a medical evaluation  Cooperate with a medication evaluation by a physician Verbalize an accurate understanding of depression Verbalize an understanding of the treatment Identify and replace thoughts that support depression Learn and implement behavioral strategies Verbalize an understanding and resolution of current interpersonal problems Learn and implement problem solving and decision making skills Learn and implement conflict resolution skills to resolve interpersonal problems Verbalize an understanding of healthy and unhealthy emotions verbalize insight into how past relationships may be influence current experiences with depression Use mindfulness and acceptance strategies and increase value based behavior  Increase hopeful statements about the future.  Interventions Engage the patient in behavioral activation Use instruction, modeling, and role-playing to build the client's general social, communication, and/or conflict resolution skills Use Acceptance and Commitment Therapy to help client accept uncomfortable realities in order to accomplish value-consistent goals Reinforce the client's insight into the role of his/her past emotional pain and present anxiety  Support the client in following through with work, family, and social activities Teach and implement sleep hygiene practices  Refer the patient to a physician for a psychotropic medication consultation Monito the clint's psychotropic medication compliance Discuss how anxiety typically involves excessive  worry, various bodily expressions of tension, and avoidance of what is threatening that interact to maintain the problem  Teach the patient relaxation skills Assign the patient homework Discuss examples demonstrating that unrealistic worry overestimates the probability of threats and underestimates patient's ability  Assist the patient in analyzing his or her worries Help patient understand that avoidance is reinforcing  Consistent with treatment model, discuss how change in cognitive, behavioral, and interpersonal can help client alleviate depression CBT Behavioral activation help the client explore the relationship, nature of the dispute,  Help the client develop new interpersonal skills and relationships Conduct Problem solving therapy Teach conflict resolution skills Use a process-experiential approach Conduct TLDP Conduct ACT Evaluate need for psychotropic medication Monitor adherence to medication   The patient and clinician reviewed the treatment plan on 04/26/2021. The patient and clinician reevaluated on 9.12.2023. The patient approved of the treatment.  Conception Chancy, PsyD

## 2022-05-04 ENCOUNTER — Other Ambulatory Visit (HOSPITAL_COMMUNITY): Payer: Self-pay

## 2022-05-09 ENCOUNTER — Inpatient Hospital Stay: Payer: Medicare Other | Attending: Hematology

## 2022-05-09 DIAGNOSIS — Z1509 Genetic susceptibility to other malignant neoplasm: Secondary | ICD-10-CM | POA: Diagnosis not present

## 2022-05-09 DIAGNOSIS — C61 Malignant neoplasm of prostate: Secondary | ICD-10-CM | POA: Diagnosis present

## 2022-05-09 DIAGNOSIS — Z923 Personal history of irradiation: Secondary | ICD-10-CM | POA: Insufficient documentation

## 2022-05-09 DIAGNOSIS — I10 Essential (primary) hypertension: Secondary | ICD-10-CM | POA: Insufficient documentation

## 2022-05-09 DIAGNOSIS — R5383 Other fatigue: Secondary | ICD-10-CM | POA: Insufficient documentation

## 2022-05-09 DIAGNOSIS — Z79899 Other long term (current) drug therapy: Secondary | ICD-10-CM | POA: Insufficient documentation

## 2022-05-09 DIAGNOSIS — C7951 Secondary malignant neoplasm of bone: Secondary | ICD-10-CM | POA: Insufficient documentation

## 2022-05-09 DIAGNOSIS — E559 Vitamin D deficiency, unspecified: Secondary | ICD-10-CM

## 2022-05-09 LAB — COMPREHENSIVE METABOLIC PANEL
ALT: 14 U/L (ref 0–44)
AST: 22 U/L (ref 15–41)
Albumin: 3.9 g/dL (ref 3.5–5.0)
Alkaline Phosphatase: 48 U/L (ref 38–126)
Anion gap: 8 (ref 5–15)
BUN: 13 mg/dL (ref 8–23)
CO2: 29 mmol/L (ref 22–32)
Calcium: 9.2 mg/dL (ref 8.9–10.3)
Chloride: 103 mmol/L (ref 98–111)
Creatinine, Ser: 0.95 mg/dL (ref 0.61–1.24)
GFR, Estimated: >60 mL/min (ref >60)
Glucose, Bld: 100 mg/dL — ABNORMAL HIGH (ref 70–99)
Potassium: 4.3 mmol/L (ref 3.5–5.1)
Sodium: 140 mmol/L (ref 135–145)
Total Bilirubin: 1 mg/dL (ref 0.3–1.2)
Total Protein: 6.7 g/dL (ref 6.5–8.1)

## 2022-05-09 LAB — CBC WITH DIFFERENTIAL/PLATELET
Abs Immature Granulocytes: 0.02 10*3/uL (ref 0.00–0.07)
Basophils Absolute: 0 10*3/uL (ref 0.0–0.1)
Basophils Relative: 1 %
Eosinophils Absolute: 0.1 10*3/uL (ref 0.0–0.5)
Eosinophils Relative: 3 %
HCT: 42.4 % (ref 39.0–52.0)
Hemoglobin: 14.1 g/dL (ref 13.0–17.0)
Immature Granulocytes: 1 %
Lymphocytes Relative: 23 %
Lymphs Abs: 0.9 10*3/uL (ref 0.7–4.0)
MCH: 33.4 pg (ref 26.0–34.0)
MCHC: 33.3 g/dL (ref 30.0–36.0)
MCV: 100.5 fL — ABNORMAL HIGH (ref 80.0–100.0)
Monocytes Absolute: 0.5 10*3/uL (ref 0.1–1.0)
Monocytes Relative: 11 %
Neutro Abs: 2.5 10*3/uL (ref 1.7–7.7)
Neutrophils Relative %: 61 %
Platelets: 250 10*3/uL (ref 150–400)
RBC: 4.22 MIL/uL (ref 4.22–5.81)
RDW: 13.3 % (ref 11.5–15.5)
WBC: 4.1 10*3/uL (ref 4.0–10.5)
nRBC: 0 % (ref 0.0–0.2)

## 2022-05-09 LAB — PSA: Prostatic Specific Antigen: 0.65 ng/mL (ref 0.00–4.00)

## 2022-05-10 ENCOUNTER — Other Ambulatory Visit (HOSPITAL_COMMUNITY): Payer: Self-pay

## 2022-05-11 ENCOUNTER — Ambulatory Visit (INDEPENDENT_AMBULATORY_CARE_PROVIDER_SITE_OTHER): Payer: Medicare Other | Admitting: Psychologist

## 2022-05-11 DIAGNOSIS — F411 Generalized anxiety disorder: Secondary | ICD-10-CM

## 2022-05-11 DIAGNOSIS — F331 Major depressive disorder, recurrent, moderate: Secondary | ICD-10-CM | POA: Diagnosis not present

## 2022-05-11 LAB — TESTOSTERONE: Testosterone: 27 ng/dL — ABNORMAL LOW (ref 264–916)

## 2022-05-11 NOTE — Progress Notes (Addendum)
[facility] Counselor/Therapist Progress Note  Patient ID: Nathan Smith, MRN: [id number],    Date: 05/11/2022  Time Spent: 3:05 pm to 3:43 pm; total time: 38 minutes  This session was held via in person. The patient consented to in-person therapy and was in the clinician's office. Limits of confidentiality were discussed with the patient  Treatment Type: Individual Therapy  Reported Symptoms: Doing better  Mental Status Exam: Appearance:  Well Groomed     Behavior: Appropriate  Motor: Normal  Speech/Language:  Normal Rate  Affect: Appropriate  Mood: normal  Thought process: normal  Thought content:   WNL  Sensory/Perceptual disturbances:   WNL  Orientation: oriented to person, place, time/date, and situation  Attention: Good  Concentration: Good  Memory: WNL  Fund of knowledge:  Good  Insight:   Fair  Judgment:  Fair  Impulse Control: Good   Risk Assessment: Danger to Self:  No Self-injurious Behavior: No Danger to Others: No Duty to Warn:no Physical Aggression / Violence:No  Access to Firearms a concern: No  Gang Involvement:No   Subjective: Patient described himself as doing well and indicated that he had completed the homework. He spent time reflecting on the homework. From there, he disclosed that his PSA levels have slightly increased and that he sees the oncologist next week. He then spent time reflecting on the idea of not engaging with his thoughts and just letting them be present. He ended the session reflecting on the idea of whether or not he has forgiven himself. He was agreeable to following up. He denied suicidal and homicidal ideation.    Interventions: Worked on developing a therapeutic relationship with the patient using active listening and reflective statements. Provided emotional support using empathy and validation. Praised the patient for completing the homework assignment. Normalized patient's response to the podcast. Challenged some  of the thoughts expressed. Processed thoughts and emotions. Reviewed concerns related to PSA levels increasing. Normalized some of the concerns. Validated the idea that patient does not want to suffer. Completed an exercise with the intent of sitting with emotions and not fighting them. Processed thoughts. Identified the theme of forgiveness and began to explore what this looks like to the patient. Assigned homework. Assessed for suicidal and homicidal ideation.   Homework: Reflect on the idea of forgiving self  Next Session: Review homework and emotional support.   Diagnosis: F33.1 major depressive affective disorder, recurrent, moderate and F41.1 generalized anxiety disorder  Plan:    Goals Alleviate depressive symptoms Recognize, accept, and cope with depressive feelings Develop healthy thinking patterns Develop healthy interpersonal relationships Reduce overall frequency, intensity, and duration of anxiety Stabilize anxiety level wile increasing ability to function Enhance ability to effectively cope with full variety of stressors Learn and implement coping skills that result in a reduction of anxiety   Objectives target date for all objectives is 04/08/2023 Verbalize an understanding of the cognitive, physiological, and behavioral components of anxiety Learning and implement calming skills to reduce overall anxiety Verbalize an understanding of the role that cognitive biases play in excessive irrational worry and persistent anxiety symptoms Identify, challenge, and replace based fearful talk Learn and implement problem solving strategies Identify and engage in pleasant activities Learning and implement personal and interpersonal skills to reduce anxiety and improve interpersonal relationships Learn to accept limitations in life and commit to tolerating, rather than avoiding, unpleasant emotions while accomplishing meaningful goals Identify major life conflicts from the past and  present that form the basis for present anxiety Maintain  involvement in work, family, and social activities Reestablish a consistent sleep-wake cycle Cooperate with a medical evaluation  Cooperate with a medication evaluation by a physician Verbalize an accurate understanding of depression Verbalize an understanding of the treatment Identify and replace thoughts that support depression Learn and implement behavioral strategies Verbalize an understanding and resolution of current interpersonal problems Learn and implement problem solving and decision making skills Learn and implement conflict resolution skills to resolve interpersonal problems Verbalize an understanding of healthy and unhealthy emotions verbalize insight into how past relationships may be influence current experiences with depression Use mindfulness and acceptance strategies and increase value based behavior  Increase hopeful statements about the future.  Interventions Engage the patient in behavioral activation Use instruction, modeling, and role-playing to build the client's general social, communication, and/or conflict resolution skills Use Acceptance and Commitment Therapy to help client accept uncomfortable realities in order to accomplish value-consistent goals Reinforce the client's insight into the role of his/her past emotional pain and present anxiety  Support the client in following through with work, family, and social activities Teach and implement sleep hygiene practices  Refer the patient to a physician for a psychotropic medication consultation Monito the clint's psychotropic medication compliance Discuss how anxiety typically involves excessive worry, various bodily expressions of tension, and avoidance of what is threatening that interact to maintain the problem  Teach the patient relaxation skills Assign the patient homework Discuss examples demonstrating that unrealistic worry overestimates the  probability of threats and underestimates patient's ability  Assist the patient in analyzing his or her worries Help patient understand that avoidance is reinforcing  Consistent with treatment model, discuss how change in cognitive, behavioral, and interpersonal can help client alleviate depression CBT Behavioral activation help the client explore the relationship, nature of the dispute,  Help the client develop new interpersonal skills and relationships Conduct Problem solving therapy Teach conflict resolution skills Use a process-experiential approach Conduct TLDP Conduct ACT Evaluate need for psychotropic medication Monitor adherence to medication   The patient and clinician reviewed the treatment plan on 04/26/2021. The patient and clinician reevaluated on 9.12.2023. The patient approved of the treatment.  Conception Chancy, PsyD

## 2022-05-16 ENCOUNTER — Inpatient Hospital Stay (HOSPITAL_BASED_OUTPATIENT_CLINIC_OR_DEPARTMENT_OTHER): Payer: Medicare Other | Admitting: Hematology

## 2022-05-16 ENCOUNTER — Encounter: Payer: Self-pay | Admitting: Hematology

## 2022-05-16 VITALS — BP 161/99 | HR 68 | Temp 98.5°F | Resp 16 | Wt 149.3 lb

## 2022-05-16 DIAGNOSIS — C61 Malignant neoplasm of prostate: Secondary | ICD-10-CM | POA: Diagnosis not present

## 2022-05-16 NOTE — Progress Notes (Signed)
[place], [place] [address]   CLINIC:  Medical Oncology/Hematology  PCP:  Patient, No Pcp Per None None   REASON FOR VISIT:  Follow-up for castration resistant prostate cancer  PRIOR THERAPY: External beam radiation from 05/27/2013 to 07/28/2013  NGS Results: not done  CURRENT THERAPY: Lupron every 6 months; enzalutamide 80 mg QD  BRIEF ONCOLOGIC HISTORY:  Oncology History  Prostate cancer ([place])  09/24/2012 Initial Diagnosis   Prostate cancer ([place])   12/26/2019 Cancer Staging   Staging form: Prostate, AJCC 7th Edition - Clinical stage from 12/26/2019: Stage IV (yTX, N1, M1b, PSA: 10 to 19, Gleason 8-10) - Signed by Derek Jack, MD on 12/26/2019   08/24/2020 Genetic Testing   Positive genetic testing:  A single, 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 Testing was completed through the Common Hereditary Cancers panel and Prostate Cancer HRR panel offered by [facility] laboratories. The report date is 08/24/2020.   The Common Hereditary Cancers Panel offered by [facility] includes sequencing and/or deletion duplication testing of the following 47 genes: APC, ATM, AXIN2, BARD1, BMPR1A, BRCA1, BRCA2, BRIP1, CDH1, CDK4, CDKN2A (p14ARF), CDKN2A (p16INK4a), CHEK2, CTNNA1, DICER1, EPCAM (Deletion/duplication testing only), GREM1 (promoter region deletion/duplication testing only), KIT, MEN1, MLH1, MSH2, MSH3, MSH6, MUTYH, NBN, NF1, NTHL1, PALB2, PDGFRA, PMS2, POLD1, POLE, PTEN, RAD50, RAD51C, RAD51D, SDHB, SDHC, SDHD, SMAD4, SMARCA4. STK11, TP53, TSC1, TSC2, and VHL.  The following genes were evaluated for sequence changes only: SDHA and HOXB13 c.251G>A variant only. The Prostate Cancer HRR Panel offered by [facility] includes sequencing and/or deletion/duplication analysis of the following 10 genes: ATM, BARD1, BRCA1, BRCA2, BRIP1, CHEK2, FANCL, PALB2, RAD51C, and RAD51D.     CANCER STAGING:  Cancer Staging   Prostate cancer [facility]) Staging form: Prostate, AJCC 7th Edition - Clinical stage from 12/26/2019: Stage IV (yTX, N1, M1b, PSA: 10 to 19, Gleason 8-10) - Signed by Derek Jack, MD on 12/26/2019   INTERVAL HISTORY:  [name], a 71 y.o. male, seen for follow-up of prostate cancer.  He reports fatigue, anxiety and irritability which lasts about few hours every day.  Denies any falls or seizure activity.  He quit taking Ritalin about 2 months ago.  Appetite is good.  He gained about 4 pounds.  Hot flashes are stable.  REVIEW OF SYSTEMS:  Review of Systems  Gastrointestinal:  Positive for diarrhea. Negative for blood in stool and nausea.  Endocrine: Positive for hot flashes.  Psychiatric/Behavioral:  The patient is nervous/anxious.   All other systems reviewed and are negative.   PAST MEDICAL/SURGICAL HISTORY:  Past Medical History:  Diagnosis Date   Anxiety    Bilateral hydronephrosis    Bladder incontinence    night time,  past 2 months   BPH (benign prostatic hypertrophy) with urinary obstruction    Depression    ED (erectile dysfunction)    H/O ascites    trace   Hypertension    Prostate cancer ([place]) 09/24/12   gleason 4+3=7., & 4+4=8,PSA=67.30, volume=33.7cc   Past Surgical History:  Procedure Laterality Date   PROSTATE BIOPSY  09/24/12   Adenocarcinoma   PROSTATE BIOPSY N/A 12/20/2017   Procedure: BIOPSY TRANSRECTAL ULTRASONIC PROSTATE (TUBP);  Surgeon: Franchot Gallo, MD;  Location: [facility];  Service: Urology;  Laterality: N/A;   TONSILLECTOMY      SOCIAL HISTORY:  Social History   Socioeconomic History   Marital status: Single    Spouse name: Not on file  Number of children: 0   Years of education: Not on file   Highest education level: Not on file  Occupational History   Occupation: [name]    Employer: [employer]  Tobacco Use   Smoking status: Never   Smokeless tobacco: Never  Vaping Use   Vaping Use: Never used   Substance and Sexual Activity   Alcohol use: Yes    Alcohol/week: 2.0 standard drinks of alcohol    Types: 2 Cans of beer per week    Comment: 3 per day, beer/wine , hx etoh abuse years ago   Drug use: No    Comment: hx   Sexual activity: Yes    Birth control/protection: Condom  Other Topics Concern   Not on file  Social History Narrative   Not on file   Social Determinants of Health   Financial Resource Strain: Low Risk  (12/08/2019)   Overall Financial Resource Strain (CARDIA)    Difficulty of Paying Living Expenses: Not very hard  Food Insecurity: No Food Insecurity (12/08/2019)   Hunger Vital Sign    Worried About Running Out of Food in the Last Year: Never true    Ran Out of Food in the Last Year: Never true  Transportation Needs: No Transportation Needs (12/08/2019)   PRAPARE - Hydrologist (Medical): No    Lack of Transportation (Non-Medical): No  Physical Activity: Sufficiently Active (12/08/2019)   Exercise Vital Sign    Days of Exercise per Week: 6 days    Minutes of Exercise per Session: 60 min  Stress: Stress Concern Present (12/08/2019)   [place]    Feeling of Stress : To some extent  Social Connections: Unknown (12/08/2019)   Social Connection and Isolation Panel [NHANES]    Frequency of Communication with Friends and Family: Twice a week    Frequency of Social Gatherings with Friends and Family: Once a week    Attends Religious Services: More than 4 times per year    Active Member of [employer] or Organizations: No    Attends Music therapist: More than 4 times per year    Marital Status: Not on file  Intimate Partner Violence: Not At Risk (12/08/2019)   Humiliation, Afraid, Rape, and Kick questionnaire    Fear of Current or Ex-Partner: No    Emotionally Abused: No    Physically Abused: No    Sexually Abused: No    FAMILY HISTORY:  Family History  Problem  Relation Age of Onset   Depression Mother    Dementia Mother    Anxiety disorder Mother    Prostate cancer Father        unconfirmed diagnosis    CURRENT MEDICATIONS:  Current Outpatient Medications  Medication Sig Dispense Refill   Ascorbic Acid (VITAMIN C) 1000 MG tablet Take 1,000 mg by mouth daily.     b complex vitamins capsule Take 1 capsule by mouth daily.     Bacillus Coagulans-Inulin (PROBIOTIC) 1-250 BILLION-MG CAPS Take by mouth daily.      Cholecalciferol 125 MCG (5000 UT) capsule Take 5,000 Units by mouth daily.      enzalutamide (XTANDI) 80 MG tablet Take 1 tablet (80 mg total) by mouth daily. 30 tablet 3   Ginseng 100 MG CAPS Take by mouth daily.      L-ARGININE PO Take 1,000 mg by mouth daily.      losartan (COZAAR) 25 MG tablet  TAKE 1 TABLET(25 MG) BY MOUTH DAILY 90 tablet 3   melatonin 1 MG TABS tablet Take 2 mg by mouth at bedtime.      methylphenidate (RITALIN) 5 MG tablet Take 0.5 tablets (2.5 mg total) by mouth daily as needed. 10 tablet 0   Probiotic Product (ALIGN) 4 MG CAPS Take 1 capsule by mouth daily.      tamsulosin (FLOMAX) 0.4 MG CAPS capsule TAKE 1 CAPSULE(0.4 MG) BY MOUTH DAILY 90 capsule 3   zinc gluconate 50 MG tablet Take 50 mg by mouth daily.     megestrol (MEGACE) 20 MG tablet Take 1 tablet (20 mg total) by mouth 2 (two) times daily. 60 tablet 11   No current facility-administered medications for this visit.   Facility-Administered Medications Ordered in Other Visits  Medication Dose Route Frequency Provider Last Rate Last Admin   fulvestrant (FASLODEX) 250 MG/5ML injection             ALLERGIES:  No Known Allergies  PHYSICAL EXAM:  Performance status (ECOG): 0 - Asymptomatic  Vitals:   05/16/22 1533  BP: (!) 161/99  Pulse: 68  Resp: 16  Temp: 98.5 F (36.9 C)  SpO2: 98%   Wt Readings from Last 3 Encounters:  05/16/22 149 lb 4.8 oz (67.7 kg)  02/27/22 146 lb 6.4 oz (66.4 kg)  01/23/22 145 lb 8 oz (66 kg)   Physical  Exam Vitals reviewed.  Constitutional:      Appearance: Normal appearance.  Cardiovascular:     Rate and Rhythm: Normal rate and regular rhythm.     Pulses: Normal pulses.     Heart sounds: Normal heart sounds.  Pulmonary:     Effort: Pulmonary effort is normal.     Breath sounds: Normal breath sounds.  Musculoskeletal:     Right lower leg: No edema.     Left lower leg: No edema.  Neurological:     General: No focal deficit present.     Mental Status: He is alert and oriented to person, place, and time.  Psychiatric:        Mood and Affect: Mood normal.        Behavior: Behavior normal.      LABORATORY DATA:  I have reviewed the labs as listed.     Latest Ref Rng & Units 05/09/2022    2:13 PM 01/16/2022    1:38 PM 10/10/2021    3:24 PM  CBC  WBC 4.0 - 10.5 K/uL 4.1  4.2  5.0   Hemoglobin 13.0 - 17.0 g/dL 14.1  15.5  14.7   Hematocrit 39.0 - 52.0 % 42.4  47.4  44.1   Platelets 150 - 400 K/uL 250  264  255       Latest Ref Rng & Units 05/09/2022    2:13 PM 01/16/2022    1:38 PM 10/10/2021    3:24 PM  CMP  Glucose 70 - 99 mg/dL 100  98  102   BUN 8 - 23 mg/dL '13  12  12   ' Creatinine 0.61 - 1.24 mg/dL 0.95  1.03  0.80   Sodium 135 - 145 mmol/L 140  139  139   Potassium 3.5 - 5.1 mmol/L 4.3  4.0  4.1   Chloride 98 - 111 mmol/L 103  105  103   CO2 22 - 32 mmol/L '29  29  26   ' Calcium 8.9 - 10.3 mg/dL 9.2  9.3  9.2   Total Protein 6.5 - 8.1  g/dL 6.7  7.1  6.9   Total Bilirubin 0.3 - 1.2 mg/dL 1.0  0.9  0.7   Alkaline Phos 38 - 126 U/L 48  53  57   AST 15 - 41 U/L '22  25  22   ' ALT 0 - 44 U/L '14  15  13     ' DIAGNOSTIC IMAGING:  I have independently reviewed the scans and discussed with the patient. No results found.   ASSESSMENT:  1.  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 -External beam radiation with 2 years of Lupron from 05/27/2013 through 07/28/2013.  Received Lupron  until 09/28/2014. -Rising PSA levels in December 2018, of 2.8.  Local recurrence was confirmed with biopsy. -Bone scan on 04/12/2018 did not show any evidence of metastatic disease. -He was evaluated by Dr. Iona Beard at [facility].  Bone scan and CT scan at [facility] on 07/02/2018 did not show any evidence of metastatic disease.  Left para-aortic lymph node measures 8 mm.  Lupron started for nonmetastatic HSPC in November 2019 for rapid PSA doubling time of less than 3 months.  He had difficulty tolerating Lupron with irritability, low energy and fatigue. -PSA 5.7 (07/08/2019), PSA 11.1 (11/26/2019) with testosterone 18. -Fluciclovine PET CT scan on 12/23/2019 showed T11 meta stasis.  Asymmetric hypermetabolism within the prostate suspicious for residual disease.  Mild enlargement of 2 adjacent lower left para-aortic nodes, largest 8 mm with SUV of 3.4.  These nodes measured maximally 5 mm on PET scan on 02/25/2018. -Abiraterone 500 mg daily and prednisone from 01/03/2020 through 02/16/2020, discontinued secondary to elevated LFTs. -We will consider checking germline and somatic mutation testing. -Lupron 45 mg on 02/17/2020 at Dr. Alan Ripper office. -Enzalutamide 80 mg daily started on 03/10/2020.   PLAN:  1.  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 - Reports fatigue, anxiety and irritability lasting few hours every day. - Last Lupron injection on 02/27/2022. - Reviewed labs from 05/09/2022 which showed normal LFTs, creatinine and calcium.  CBC was grossly normal.  MCV was slightly elevated and stable. - Testosterone was 27.  PSA was 0.65, increased from 0.41 on 01/16/2022 and 0.3 on 10/10/2021. - We have discussed increasing the dose of enzalutamide to 120 mg daily.  He is reluctant to consider it at this time.  We will check him back in 3 months and repeat PSA.  If PSA goes beyond 1, will increase his the dose to 120 mg daily.   2.  Bone metastasis: -  Continue calcium and D supplements.  Last vitamin D was normal.   3.  Heterozygosity for MUTYH mutation: - He has refused Cologuard test.   4.  Hypertension: - Continue losartan 25 mg daily.  Blood pressure is slightly high in the office today as he was rushing.   5.  Fatigue: - He has not taken Ritalin in the last 2 months.  Take Ritalin 1/5 of 5 mg tablet when needed.   Orders placed this encounter:  Orders Placed This Encounter  Procedures   CBC with Differential/Platelet   Comprehensive metabolic panel   PSA     Derek Jack, MD [place] [phone]

## 2022-05-16 NOTE — Patient Instructions (Addendum)
[facility]  Discharge Instructions  You were seen and examined today by Dr. Delton Coombes.  Dr. Delton Coombes discussed your most recent lab work which revealed that everything looks good other than your PSA has went up just slightly.   Continue taking Xtandi as prescribed and if the PSA continues to go up Dr. Delton Coombes will increase the dose at your next appointment.  Follow-up as scheduled in 3 months with labs.    Thank you for choosing [facility] to provide your oncology and hematology care.   To afford each patient quality time with our provider, please arrive at least 15 minutes before your scheduled appointment time. You may need to reschedule your appointment if you arrive late (10 or more minutes). Arriving late affects you and other patients whose appointments are after yours.  Also, if you miss three or more appointments without notifying the office, you may be dismissed from the clinic at the provider's discretion.    Again, thank you for choosing [facility].  Our hope is that these requests will decrease the amount of time that you wait before being seen by our physicians.   If you have a lab appointment with the [facility] please come in thru the Main Entrance and check in at the main information desk.           _____________________________________________________________  Should you have questions after your visit to [facility], please contact our office at [phone] and follow the prompts.  Our office hours are 8:00 a.m. to 4:30 p.m. Monday - Thursday and 8:00 a.m. to 2:30 p.m. Friday.  Please note that voicemails left after 4:00 p.m. may not be returned until the following business day.  We are closed weekends and all major holidays.  You do have access to a nurse 24-7, just call the main number to the clinic [phone] and do not press any options, hold on the line and a nurse will  answer the phone.    For prescription refill requests, have your pharmacy contact our office and allow 72 hours.    Masks are optional in the cancer centers. If you would like for your care team to wear a mask while they are taking care of you, please let them know. You may have one support person who is at least 69 years old accompany you for your appointments.

## 2022-05-23 ENCOUNTER — Other Ambulatory Visit (HOSPITAL_COMMUNITY): Payer: Self-pay

## 2022-05-30 ENCOUNTER — Other Ambulatory Visit (HOSPITAL_COMMUNITY): Payer: Self-pay

## 2022-06-06 ENCOUNTER — Ambulatory Visit (INDEPENDENT_AMBULATORY_CARE_PROVIDER_SITE_OTHER): Payer: Medicare Other | Admitting: Psychologist

## 2022-06-06 DIAGNOSIS — F411 Generalized anxiety disorder: Secondary | ICD-10-CM | POA: Diagnosis not present

## 2022-06-06 DIAGNOSIS — F331 Major depressive disorder, recurrent, moderate: Secondary | ICD-10-CM | POA: Diagnosis not present

## 2022-06-06 NOTE — Progress Notes (Signed)
[facility] Counselor/Therapist Progress Note  Patient ID: Nathan Smith, MRN: [id number],    Date: 06/06/2022  Time Spent: 02:04 pm to 02:45 pm; total time: 41 minutes  This session was held via in person. The patient consented to in-person therapy and was in the clinician's office. Limits of confidentiality were discussed with the patient  Treatment Type: Individual Therapy  Reported Symptoms: Doing better  Mental Status Exam: Appearance:  Well Groomed     Behavior: Appropriate  Motor: Normal  Speech/Language:  Normal Rate  Affect: Appropriate  Mood: normal  Thought process: normal  Thought content:   WNL  Sensory/Perceptual disturbances:   WNL  Orientation: oriented to person, place, time/date, and situation  Attention: Good  Concentration: Good  Memory: WNL  Fund of knowledge:  Good  Insight:   Fair  Judgment:  Fair  Impulse Control: Good   Risk Assessment: Danger to Self:  No Self-injurious Behavior: No Danger to Others: No Duty to Warn:no Physical Aggression / Violence:No  Access to Firearms a concern: No  Gang Involvement:No   Subjective: Patient described himself as doing well while reflecting on his most recent appointment with his oncologist. He indicated that if his PSA continues to go up that he will have to increase his chemotherapy medication. From there, he indicated that his relationship with the pastor has improved. He then spent the rest of the session processing the idea of forgiveness of self. He was agreeable to homework and following up. He denied suicidal and homicidal ideation.    Interventions: Worked on developing a therapeutic relationship with the patient using active listening and reflective statements. Provided emotional support using empathy and validation. Praised the patient for completing the homework assignment. Praised the patient for doing well. Reviewed the appointment with the oncologist. Used socratic questions to  assist the patient gain insight into self. Challenged some of the thoughts expressed by the patient. Validated expressed thoughts and emotions. Spent the session reflecting on the concept of forgiving self. Explored the idea of writing lyrics regarding forgiving self. Validated patient's response. Provided empathic statements. Assigned homework. Assessed for suicidal and homicidal ideation.   Homework: Write lyrics regarding forgiving self  Next Session: Review homework and emotional support.   Diagnosis: F33.1 major depressive affective disorder, recurrent, moderate and F41.1 generalized anxiety disorder  Plan:    Goals Alleviate depressive symptoms Recognize, accept, and cope with depressive feelings Develop healthy thinking patterns Develop healthy interpersonal relationships Reduce overall frequency, intensity, and duration of anxiety Stabilize anxiety level wile increasing ability to function Enhance ability to effectively cope with full variety of stressors Learn and implement coping skills that result in a reduction of anxiety   Objectives target date for all objectives is 04/08/2023 Verbalize an understanding of the cognitive, physiological, and behavioral components of anxiety Learning and implement calming skills to reduce overall anxiety Verbalize an understanding of the role that cognitive biases play in excessive irrational worry and persistent anxiety symptoms Identify, challenge, and replace based fearful talk Learn and implement problem solving strategies Identify and engage in pleasant activities Learning and implement personal and interpersonal skills to reduce anxiety and improve interpersonal relationships Learn to accept limitations in life and commit to tolerating, rather than avoiding, unpleasant emotions while accomplishing meaningful goals Identify major life conflicts from the past and present that form the basis for present anxiety Maintain involvement in work,  family, and social activities Reestablish a consistent sleep-wake cycle Cooperate with a medical evaluation  Cooperate with a medication  evaluation by a physician Verbalize an accurate understanding of depression Verbalize an understanding of the treatment Identify and replace thoughts that support depression Learn and implement behavioral strategies Verbalize an understanding and resolution of current interpersonal problems Learn and implement problem solving and decision making skills Learn and implement conflict resolution skills to resolve interpersonal problems Verbalize an understanding of healthy and unhealthy emotions verbalize insight into how past relationships may be influence current experiences with depression Use mindfulness and acceptance strategies and increase value based behavior  Increase hopeful statements about the future.  Interventions Engage the patient in behavioral activation Use instruction, modeling, and role-playing to build the client's general social, communication, and/or conflict resolution skills Use Acceptance and Commitment Therapy to help client accept uncomfortable realities in order to accomplish value-consistent goals Reinforce the client's insight into the role of his/her past emotional pain and present anxiety  Support the client in following through with work, family, and social activities Teach and implement sleep hygiene practices  Refer the patient to a physician for a psychotropic medication consultation Monito the clint's psychotropic medication compliance Discuss how anxiety typically involves excessive worry, various bodily expressions of tension, and avoidance of what is threatening that interact to maintain the problem  Teach the patient relaxation skills Assign the patient homework Discuss examples demonstrating that unrealistic worry overestimates the probability of threats and underestimates patient's ability  Assist the patient in  analyzing his or her worries Help patient understand that avoidance is reinforcing  Consistent with treatment model, discuss how change in cognitive, behavioral, and interpersonal can help client alleviate depression CBT Behavioral activation help the client explore the relationship, nature of the dispute,  Help the client develop new interpersonal skills and relationships Conduct Problem solving therapy Teach conflict resolution skills Use a process-experiential approach Conduct TLDP Conduct ACT Evaluate need for psychotropic medication Monitor adherence to medication   The patient and clinician reviewed the treatment plan on 04/26/2021. The patient and clinician reevaluated on 9.12.2023. The patient approved of the treatment.  Nathan Chancy, PsyD

## 2022-06-12 ENCOUNTER — Other Ambulatory Visit (HOSPITAL_COMMUNITY): Payer: Self-pay

## 2022-06-23 ENCOUNTER — Other Ambulatory Visit (HOSPITAL_COMMUNITY): Payer: Self-pay

## 2022-06-30 ENCOUNTER — Other Ambulatory Visit (HOSPITAL_COMMUNITY): Payer: Self-pay

## 2022-07-06 ENCOUNTER — Ambulatory Visit (INDEPENDENT_AMBULATORY_CARE_PROVIDER_SITE_OTHER): Payer: Medicare Other | Admitting: Psychologist

## 2022-07-06 ENCOUNTER — Other Ambulatory Visit (HOSPITAL_COMMUNITY): Payer: Self-pay

## 2022-07-06 DIAGNOSIS — F411 Generalized anxiety disorder: Secondary | ICD-10-CM

## 2022-07-06 DIAGNOSIS — F331 Major depressive disorder, recurrent, moderate: Secondary | ICD-10-CM

## 2022-07-06 NOTE — Progress Notes (Signed)
[facility] Counselor/Therapist Progress Note  Patient ID: Nathan Smith, MRN: [id number],    Date: 07/06/2022  Time Spent: 02:10 pm to 02:50 pm; total time: 40 minutes  This session was held via in person. The patient consented to in-person therapy and was in the clinician's office. Limits of confidentiality were discussed with the patient  Treatment Type: Individual Therapy  Reported Symptoms: Doing better  Mental Status Exam: Appearance:  Well Groomed     Behavior: Appropriate  Motor: Normal  Speech/Language:  Normal Rate  Affect: Appropriate  Mood: normal  Thought process: normal  Thought content:   WNL  Sensory/Perceptual disturbances:   WNL  Orientation: oriented to person, place, time/date, and situation  Attention: Good  Concentration: Good  Memory: WNL  Fund of knowledge:  Good  Insight:   Fair  Judgment:  Fair  Impulse Control: Good   Risk Assessment: Danger to Self:  No Self-injurious Behavior: No Danger to Others: No Duty to Warn:no Physical Aggression / Violence:No  Access to Firearms a concern: No  Gang Involvement:No   Subjective: Patient described himself as doing well. He spent the session reflecting on forgiving himself including looking at forgiveness spiritually. He was agreeable to homework and following up. He denied suicidal and homicidal ideation.    Interventions: Worked on developing a therapeutic relationship with the patient using active listening and reflective statements. Provided emotional support using empathy and validation. Used summary and reflective statements. Reviewed events. Spent the session reflecting on forgiving self. Used socratic questions to assist the patient. Challenged some of the thoughts expressed. Explored the need to hold onto guilt. Provided empathic statements. Assigned homework. Assessed for suicidal and homicidal ideation.   Homework: Reflect on forgiving self  Next Session: Review homework and  emotional support.   Diagnosis: F33.1 major depressive affective disorder, recurrent, moderate and F41.1 generalized anxiety disorder  Plan:    Goals Alleviate depressive symptoms Recognize, accept, and cope with depressive feelings Develop healthy thinking patterns Develop healthy interpersonal relationships Reduce overall frequency, intensity, and duration of anxiety Stabilize anxiety level wile increasing ability to function Enhance ability to effectively cope with full variety of stressors Learn and implement coping skills that result in a reduction of anxiety   Objectives target date for all objectives is 04/08/2023 Verbalize an understanding of the cognitive, physiological, and behavioral components of anxiety Learning and implement calming skills to reduce overall anxiety Verbalize an understanding of the role that cognitive biases play in excessive irrational worry and persistent anxiety symptoms Identify, challenge, and replace based fearful talk Learn and implement problem solving strategies Identify and engage in pleasant activities Learning and implement personal and interpersonal skills to reduce anxiety and improve interpersonal relationships Learn to accept limitations in life and commit to tolerating, rather than avoiding, unpleasant emotions while accomplishing meaningful goals Identify major life conflicts from the past and present that form the basis for present anxiety Maintain involvement in work, family, and social activities Reestablish a consistent sleep-wake cycle Cooperate with a medical evaluation  Cooperate with a medication evaluation by a physician Verbalize an accurate understanding of depression Verbalize an understanding of the treatment Identify and replace thoughts that support depression Learn and implement behavioral strategies Verbalize an understanding and resolution of current interpersonal problems Learn and implement problem solving and  decision making skills Learn and implement conflict resolution skills to resolve interpersonal problems Verbalize an understanding of healthy and unhealthy emotions verbalize insight into how past relationships may be influence current experiences with depression  Use mindfulness and acceptance strategies and increase value based behavior  Increase hopeful statements about the future.  Interventions Engage the patient in behavioral activation Use instruction, modeling, and role-playing to build the client's general social, communication, and/or conflict resolution skills Use Acceptance and Commitment Therapy to help client accept uncomfortable realities in order to accomplish value-consistent goals Reinforce the client's insight into the role of his/her past emotional pain and present anxiety  Support the client in following through with work, family, and social activities Teach and implement sleep hygiene practices  Refer the patient to a physician for a psychotropic medication consultation Monito the clint's psychotropic medication compliance Discuss how anxiety typically involves excessive worry, various bodily expressions of tension, and avoidance of what is threatening that interact to maintain the problem  Teach the patient relaxation skills Assign the patient homework Discuss examples demonstrating that unrealistic worry overestimates the probability of threats and underestimates patient's ability  Assist the patient in analyzing his or her worries Help patient understand that avoidance is reinforcing  Consistent with treatment model, discuss how change in cognitive, behavioral, and interpersonal can help client alleviate depression CBT Behavioral activation help the client explore the relationship, nature of the dispute,  Help the client develop new interpersonal skills and relationships Conduct Problem solving therapy Teach conflict resolution skills Use a process-experiential  approach Conduct TLDP Conduct ACT Evaluate need for psychotropic medication Monitor adherence to medication   The patient and clinician reviewed the treatment plan on 04/26/2021. The patient and clinician reevaluated on 9.12.2023. The patient approved of the treatment.  Conception Chancy, PsyD

## 2022-07-25 ENCOUNTER — Other Ambulatory Visit: Payer: Self-pay | Admitting: *Deleted

## 2022-07-25 ENCOUNTER — Other Ambulatory Visit (HOSPITAL_COMMUNITY): Payer: Self-pay

## 2022-07-25 ENCOUNTER — Other Ambulatory Visit (HOSPITAL_COMMUNITY): Payer: Self-pay | Admitting: Hematology

## 2022-07-25 MED ORDER — ENZALUTAMIDE 80 MG PO TABS
80.0000 mg | ORAL_TABLET | Freq: Every day | ORAL | 3 refills | Status: DC
  Filled 2022-07-25 – 2022-08-14 (×2): qty 30, 30d supply, fill #0

## 2022-07-25 NOTE — Telephone Encounter (Signed)
Xtandi refill approved for '80mg'$ .  Per last note will consider increasing to 120 mg after next PSA check if elevated.  Due for labs on 1/19 and will reassess at that time, per Dr. Delton Coombes.

## 2022-07-28 ENCOUNTER — Telehealth: Payer: Self-pay

## 2022-07-28 NOTE — Telephone Encounter (Signed)
Oral Oncology Patient Advocate Encounter   Received notification that Prostate Fund for PANF opened up for renewal for patient. I have submitted to PANF and will receive a response with 4 business days notifying me of final determination. I will continue to follow and update.   Berdine Addison, [facility] Patient [place]  [phone] (phone) [phone] (fax) 07/28/2022 12:33 PM

## 2022-08-02 ENCOUNTER — Other Ambulatory Visit: Payer: Self-pay

## 2022-08-03 ENCOUNTER — Telehealth: Payer: Self-pay

## 2022-08-03 ENCOUNTER — Other Ambulatory Visit (HOSPITAL_COMMUNITY): Payer: Self-pay

## 2022-08-03 NOTE — Telephone Encounter (Signed)
Oral Oncology Patient Advocate Encounter  Reached out and spoke with patient regarding PAP paperwork, explained that I would send it to their preferred email via DocuSign.   Confirmed email address: [email or web address].    Patient expressed understanding and consent.  Will follow up once paperwork has been signed and returned.   Berdine Addison, [facility] Patient [place]  [phone] (phone) [phone] (fax) 08/03/2022 1:14 PM

## 2022-08-03 NOTE — Telephone Encounter (Signed)
Fund ran out before [employer] could process request. Proceeding with Psychologist, prison and probation services.   Berdine Addison, [facility] Patient [place]  [phone] (phone) [phone] (fax) 08/03/2022 2:27 PM

## 2022-08-03 NOTE — Telephone Encounter (Signed)
Oral Oncology Patient Advocate Encounter   Began application for assistance for Xtandi through [place].   Application will be submitted upon completion of necessary supporting documentation.   XSS's phone number [phone].   I will continue to check the status until final determination.   Berdine Addison, [facility] Patient [place]  [phone] (phone) [phone] (fax) 08/03/2022 11:52 AM

## 2022-08-03 NOTE — Telephone Encounter (Signed)
Received patient signatures. I will submit once I have received back MD signatures.  Berdine Addison, [facility] Patient [place]  [phone] (phone) [phone] (fax) 08/03/2022 1:46 PM

## 2022-08-04 NOTE — Telephone Encounter (Signed)
Oral Oncology Patient Advocate Encounter   Submitted application for assistance for Xtandi to [employer].   Application submitted via e-fax to [phone]   Xtandi's phone number [phone].   I will continue to check the status until final determination.   Berdine Addison, [facility] Patient [place]  [phone] (phone) [phone] (fax) 08/04/2022 11:14 AM

## 2022-08-08 ENCOUNTER — Other Ambulatory Visit: Payer: Self-pay

## 2022-08-10 NOTE — Telephone Encounter (Signed)
Called to check status of application. Informed by representative Harmon Pier that it was currently in review by the Case Manager. I will continue to update and follow until final determination.  Berdine Addison, [facility] Patient [place]  [phone] (phone) [phone] (fax) 08/10/2022 8:23 AM  .

## 2022-08-14 ENCOUNTER — Other Ambulatory Visit (HOSPITAL_COMMUNITY): Payer: Self-pay

## 2022-08-14 ENCOUNTER — Telehealth: Payer: Self-pay

## 2022-08-14 ENCOUNTER — Encounter (HOSPITAL_COMMUNITY): Payer: Self-pay | Admitting: Hematology

## 2022-08-14 NOTE — Telephone Encounter (Signed)
Called and left VM for patient to call me back to set up shipment of medication since we were able to secure a grant. I will continue to follow and update until patient has been reached.   Berdine Addison, [facility] Patient Nathan Smith  [phone] (phone) [phone] (fax) 08/14/2022 9:35 AM

## 2022-08-14 NOTE — Telephone Encounter (Signed)
Patient called back and delivery set up through [facility] while we finish waiting for [place] to process.   Berdine Addison, [facility] Patient [place]  [phone] (phone) [phone] (fax) 08/14/2022 12:48 PM

## 2022-08-14 NOTE — Telephone Encounter (Signed)
Oral Oncology Patient Advocate Encounter   Was successful in securing patient an $3,250 grant from Patient [place] [facility]) to provide copayment coverage for Xtandi.  This will keep the out of pocket expense at $0.     I have spoken with the patient.    The billing information is as follows and has been shared with [place].   Member ID: [id number] Group ID: [id number] RxBin: [id number] Dates of Eligibility: 07/09/22 through 07/09/23  Fund:  [place] Patient [place]  [phone] (phone) [phone] (fax) 08/14/2022 9:30 AM

## 2022-08-15 ENCOUNTER — Other Ambulatory Visit: Payer: Self-pay

## 2022-08-15 ENCOUNTER — Inpatient Hospital Stay: Payer: Medicare Other | Attending: Hematology

## 2022-08-15 ENCOUNTER — Telehealth: Payer: Self-pay

## 2022-08-15 ENCOUNTER — Encounter (HOSPITAL_COMMUNITY): Payer: Self-pay | Admitting: Hematology

## 2022-08-15 ENCOUNTER — Other Ambulatory Visit (HOSPITAL_COMMUNITY): Payer: Self-pay

## 2022-08-15 DIAGNOSIS — Z79899 Other long term (current) drug therapy: Secondary | ICD-10-CM | POA: Diagnosis not present

## 2022-08-15 DIAGNOSIS — I10 Essential (primary) hypertension: Secondary | ICD-10-CM | POA: Diagnosis not present

## 2022-08-15 DIAGNOSIS — R110 Nausea: Secondary | ICD-10-CM | POA: Diagnosis not present

## 2022-08-15 DIAGNOSIS — Z923 Personal history of irradiation: Secondary | ICD-10-CM | POA: Insufficient documentation

## 2022-08-15 DIAGNOSIS — R5383 Other fatigue: Secondary | ICD-10-CM | POA: Insufficient documentation

## 2022-08-15 DIAGNOSIS — Z1509 Genetic susceptibility to other malignant neoplasm: Secondary | ICD-10-CM | POA: Diagnosis not present

## 2022-08-15 DIAGNOSIS — C7951 Secondary malignant neoplasm of bone: Secondary | ICD-10-CM | POA: Diagnosis present

## 2022-08-15 DIAGNOSIS — R197 Diarrhea, unspecified: Secondary | ICD-10-CM | POA: Diagnosis not present

## 2022-08-15 DIAGNOSIS — C61 Malignant neoplasm of prostate: Secondary | ICD-10-CM | POA: Diagnosis present

## 2022-08-15 LAB — CBC WITH DIFFERENTIAL/PLATELET
Abs Immature Granulocytes: 0.01 10*3/uL (ref 0.00–0.07)
Basophils Absolute: 0 10*3/uL (ref 0.0–0.1)
Basophils Relative: 1 %
Eosinophils Absolute: 0.1 10*3/uL (ref 0.0–0.5)
Eosinophils Relative: 2 %
HCT: 44.2 % (ref 39.0–52.0)
Hemoglobin: 14.6 g/dL (ref 13.0–17.0)
Immature Granulocytes: 0 %
Lymphocytes Relative: 15 %
Lymphs Abs: 0.8 10*3/uL (ref 0.7–4.0)
MCH: 33.2 pg (ref 26.0–34.0)
MCHC: 33 g/dL (ref 30.0–36.0)
MCV: 100.5 fL — ABNORMAL HIGH (ref 80.0–100.0)
Monocytes Absolute: 0.5 10*3/uL (ref 0.1–1.0)
Monocytes Relative: 9 %
Neutro Abs: 3.9 10*3/uL (ref 1.7–7.7)
Neutrophils Relative %: 73 %
Platelets: 284 10*3/uL (ref 150–400)
RBC: 4.4 MIL/uL (ref 4.22–5.81)
RDW: 13.9 % (ref 11.5–15.5)
WBC: 5.2 10*3/uL (ref 4.0–10.5)
nRBC: 0 % (ref 0.0–0.2)

## 2022-08-15 LAB — PSA: Prostatic Specific Antigen: 1.28 ng/mL (ref 0.00–4.00)

## 2022-08-15 LAB — COMPREHENSIVE METABOLIC PANEL
ALT: 13 U/L (ref 0–44)
AST: 23 U/L (ref 15–41)
Albumin: 4.1 g/dL (ref 3.5–5.0)
Alkaline Phosphatase: 53 U/L (ref 38–126)
Anion gap: 9 (ref 5–15)
BUN: 15 mg/dL (ref 8–23)
CO2: 27 mmol/L (ref 22–32)
Calcium: 9.3 mg/dL (ref 8.9–10.3)
Chloride: 100 mmol/L (ref 98–111)
Creatinine, Ser: 0.92 mg/dL (ref 0.61–1.24)
GFR, Estimated: >60 mL/min (ref >60)
Glucose, Bld: 100 mg/dL — ABNORMAL HIGH (ref 70–99)
Potassium: 4.1 mmol/L (ref 3.5–5.1)
Sodium: 136 mmol/L (ref 135–145)
Total Bilirubin: 0.7 mg/dL (ref 0.3–1.2)
Total Protein: 7 g/dL (ref 6.5–8.1)

## 2022-08-15 NOTE — Telephone Encounter (Addendum)
Oral Oncology Patient Advocate Encounter   Was successful in securing patient a $7,500 grant from [place] to provide copayment coverage for Xtandi.  This will keep the out of pocket expense at $0.    I have spoken with the patient.   The billing information is as follows and has been shared with [facility].   Member ID: [id number] Group ID: [facility] RxBin: [id number] PCN: [id number] Dates of Eligibility: 08/14/22 through 08/15/23  Fund name:  Metastatic Prostate Cancer.   Berdine Addison, [facility] Patient [place]  [phone] (phone) [phone] (fax) 08/15/2022 12:16 PM

## 2022-08-16 ENCOUNTER — Other Ambulatory Visit (HOSPITAL_COMMUNITY): Payer: Self-pay

## 2022-08-16 ENCOUNTER — Telehealth: Payer: Self-pay

## 2022-08-16 LAB — TESTOSTERONE: Testosterone: 24 ng/dL — ABNORMAL LOW (ref 264–916)

## 2022-08-16 NOTE — Telephone Encounter (Signed)
Oral Oncology Patient Advocate Encounter   Was successful in securing patient an $3,250 grant from Patient [place] [facility]) to provide copayment coverage for Xtandi.  This will keep the out of pocket expense at $0.     I have spoken with the patient.    The billing information is as follows and has been shared with [place].   Member ID: [id number] Group ID: [id number] RxBin: [id number] Dates of Eligibility: 07/09/22 through 07/09/23  Fund:  [place], [place] Patient [place]  [phone] (phone) [phone] (fax) 08/16/2022 1:06 PM

## 2022-08-17 NOTE — Telephone Encounter (Signed)
We were able to obtain a grant for the time being for this patient. We will revisit Xtandi Patient Assistance if/ when grant funds are exhausted/ no other grants available. Patient is aware.   Berdine Addison, [facility] Patient [place]  [phone] (phone) [phone] (fax) 08/17/2022 10:09 AM

## 2022-08-22 ENCOUNTER — Encounter: Payer: Self-pay | Admitting: Hematology

## 2022-08-22 ENCOUNTER — Inpatient Hospital Stay: Payer: Medicare Other

## 2022-08-22 ENCOUNTER — Inpatient Hospital Stay (HOSPITAL_BASED_OUTPATIENT_CLINIC_OR_DEPARTMENT_OTHER): Payer: Medicare Other | Admitting: Hematology

## 2022-08-22 ENCOUNTER — Other Ambulatory Visit (HOSPITAL_COMMUNITY): Payer: Self-pay

## 2022-08-22 VITALS — BP 166/94 | HR 65 | Temp 97.8°F | Resp 18 | Wt 144.9 lb

## 2022-08-22 DIAGNOSIS — C61 Malignant neoplasm of prostate: Secondary | ICD-10-CM

## 2022-08-22 MED ORDER — ENZALUTAMIDE 40 MG PO CAPS
120.0000 mg | ORAL_CAPSULE | Freq: Every day | ORAL | 3 refills | Status: DC
  Filled 2022-08-22: qty 90, 30d supply, fill #0

## 2022-08-22 MED ORDER — LEUPROLIDE ACETATE (6 MONTH) 45 MG ~~LOC~~ KIT
45.0000 mg | PACK | Freq: Once | SUBCUTANEOUS | Status: AC
  Administered 2022-08-22: 45 mg via SUBCUTANEOUS
  Filled 2022-08-22: qty 45

## 2022-08-22 NOTE — Patient Instructions (Addendum)
[facility]  Discharge Instructions  You were seen and examined today by Dr. Delton Coombes.  Your PSA has doubled, please increase Xtandi to '120mg'$  daily. A new prescription has been sent in.  Follow-up as scheduled.  Thank you for choosing [facility] to provide your oncology and hematology care.   To afford each patient quality time with our provider, please arrive at least 15 minutes before your scheduled appointment time. You may need to reschedule your appointment if you arrive late (10 or more minutes). Arriving late affects you and other patients whose appointments are after yours.  Also, if you miss three or more appointments without notifying the office, you may be dismissed from the clinic at the provider's discretion.    Again, thank you for choosing [facility].  Our hope is that these requests will decrease the amount of time that you wait before being seen by our physicians.   If you have a lab appointment with the [facility] please come in thru the Main Entrance and check in at the main information desk.           _____________________________________________________________  Should you have questions after your visit to [facility], please contact our office at [phone] and follow the prompts.  Our office hours are 8:00 a.m. to 4:30 p.m. Monday - Thursday and 8:00 a.m. to 2:30 p.m. Friday.  Please note that voicemails left after 4:00 p.m. may not be returned until the following business day.  We are closed weekends and all major holidays.  You do have access to a nurse 24-7, just call the main number to the clinic [phone] and do not press any options, hold on the line and a nurse will answer the phone.    For prescription refill requests, have your pharmacy contact our office and allow 72 hours.    Masks are optional in the cancer centers. If you would like for your care team to wear a  mask while they are taking care of you, please let them know. You may have one support person who is at least 70 years old accompany you for your appointments.

## 2022-08-22 NOTE — Progress Notes (Signed)
Patient has been assessed, vital signs and labs have been reviewed by Dr. Delton Coombes. ANC, Creatinine, LFTs, and Platelets are within treatment parameters per Dr. Delton Coombes. The patient is good to proceed with Lupron treatment at this time.  Primary RN and pharmacy aware.

## 2022-08-22 NOTE — Progress Notes (Signed)
Nathan Smith, Nathan Smith [id number]   CLINIC:  Medical Oncology/Hematology  PCP:  Patient, No Pcp Per None None   REASON FOR VISIT:  Follow-up for castration resistant prostate cancer  PRIOR THERAPY: External beam radiation from 05/27/2013 to 07/28/2013  NGS Results: not done  CURRENT THERAPY: Lupron every 6 months; enzalutamide 80 mg QD  BRIEF ONCOLOGIC HISTORY:  Oncology History  Prostate cancer ([place])  09/24/2012 Initial Diagnosis   Prostate cancer ([place])   12/26/2019 Cancer Staging   Staging form: Prostate, AJCC 7th Edition - Clinical stage from 12/26/2019: Stage IV (yTX, N1, M1b, PSA: 10 to 19, Gleason 8-10) - Signed by Derek Jack, MD on 12/26/2019   08/24/2020 Genetic Testing   Positive genetic testing:  A single, 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 Testing was completed through the Common Hereditary Cancers panel and Prostate Cancer HRR panel offered by [facility] laboratories. The report date is 08/24/2020.   The Common Hereditary Cancers Panel offered by [facility] includes sequencing and/or deletion duplication testing of the following 47 genes: APC, ATM, AXIN2, BARD1, BMPR1A, BRCA1, BRCA2, BRIP1, CDH1, CDK4, CDKN2A (p14ARF), CDKN2A (p16INK4a), CHEK2, CTNNA1, DICER1, EPCAM (Deletion/duplication testing only), GREM1 (promoter region deletion/duplication testing only), KIT, MEN1, MLH1, MSH2, MSH3, MSH6, MUTYH, NBN, NF1, NTHL1, PALB2, PDGFRA, PMS2, POLD1, POLE, PTEN, RAD50, RAD51C, RAD51D, SDHB, SDHC, SDHD, SMAD4, SMARCA4. STK11, TP53, TSC1, TSC2, and VHL.  The following genes were evaluated for sequence changes only: SDHA and HOXB13 c.251G>A variant only. The Prostate Cancer HRR Panel offered by [facility] includes sequencing and/or deletion/duplication analysis of the following 10 genes: ATM, BARD1, BRCA1, BRCA2, BRIP1, CHEK2, FANCL, PALB2, RAD51C, and RAD51D.     CANCER STAGING:  Cancer Staging   Prostate cancer [facility]) Staging form: Prostate, AJCC 7th Edition - Clinical stage from 12/26/2019: Stage IV (yTX, N1, M1b, PSA: 10 to 19, Gleason 8-10) - Signed by Derek Jack, MD on 12/26/2019   INTERVAL HISTORY:  [name], a 72 y.o. male, seen for follow-up of prostate cancer.  He reports roller coaster of emotions including fatigue, anxiety and irritability.  For the last few days he has been feeling very well.  Energy levels are reported as 75%.  Occasional nausea and diarrhea is stable.  He is currently taking 80 mg of enzalutamide daily.  REVIEW OF SYSTEMS:  Review of Systems  Gastrointestinal:  Positive for diarrhea. Negative for blood in stool and nausea.  Endocrine: Positive for hot flashes.  Psychiatric/Behavioral:  The patient is nervous/anxious.   All other systems reviewed and are negative.   PAST MEDICAL/SURGICAL HISTORY:  Past Medical History:  Diagnosis Date   Anxiety    Bilateral hydronephrosis    Bladder incontinence    night time,  past 2 months   BPH (benign prostatic hypertrophy) with urinary obstruction    Depression    ED (erectile dysfunction)    H/O ascites    trace   Hypertension    Prostate cancer ([place]) 09/24/12   gleason 4+3=7., & 4+4=8,PSA=67.30, volume=33.7cc   Past Surgical History:  Procedure Laterality Date   PROSTATE BIOPSY  09/24/12   Adenocarcinoma   PROSTATE BIOPSY N/A 12/20/2017   Procedure: BIOPSY TRANSRECTAL ULTRASONIC PROSTATE (TUBP);  Surgeon: Franchot Gallo, MD;  Location: [facility];  Service: Urology;  Laterality: N/A;   TONSILLECTOMY      SOCIAL HISTORY:  Social History   Socioeconomic History   Marital status: Single    Spouse name: Not on  file   Number of children: 0   Years of education: Not on file   Highest education level: Not on file  Occupational History   Occupation: [name]    Employer: [employer]  Tobacco Use   Smoking status: Never   Smokeless tobacco: Never  Vaping  Use   Vaping Use: Never used  Substance and Sexual Activity   Alcohol use: Yes    Alcohol/week: 2.0 standard drinks of alcohol    Types: 2 Cans of beer per week    Comment: 3 per day, beer/wine , hx etoh abuse years ago   Drug use: No    Comment: hx   Sexual activity: Yes    Birth control/protection: Condom  Other Topics Concern   Not on file  Social History Narrative   Not on file   Social Determinants of Health   Financial Resource Strain: Low Risk  (12/08/2019)   Overall Financial Resource Strain (CARDIA)    Difficulty of Paying Living Expenses: Not very hard  Food Insecurity: No Food Insecurity (12/08/2019)   Hunger Vital Sign    Worried About Running Out of Food in the Last Year: Never true    Ran Out of Food in the Last Year: Never true  Transportation Needs: No Transportation Needs (12/08/2019)   PRAPARE - Hydrologist (Medical): No    Lack of Transportation (Non-Medical): No  Physical Activity: Sufficiently Active (12/08/2019)   Exercise Vital Sign    Days of Exercise per Week: 6 days    Minutes of Exercise per Session: 60 min  Stress: Stress Concern Present (12/08/2019)   [place]    Feeling of Stress : To some extent  Social Connections: Unknown (12/08/2019)   Social Connection and Isolation Panel [NHANES]    Frequency of Communication with Friends and Family: Twice a week    Frequency of Social Gatherings with Friends and Family: Once a week    Attends Religious Services: More than 4 times per year    Active Member of [employer] or Organizations: No    Attends Music therapist: More than 4 times per year    Marital Status: Not on file  Intimate Partner Violence: Not At Risk (12/08/2019)   Humiliation, Afraid, Rape, and Kick questionnaire    Fear of Current or Ex-Partner: No    Emotionally Abused: No    Physically Abused: No    Sexually Abused: No    FAMILY HISTORY:   Family History  Problem Relation Age of Onset   Depression Mother    Dementia Mother    Anxiety disorder Mother    Prostate cancer Father        unconfirmed diagnosis    CURRENT MEDICATIONS:  Current Outpatient Medications  Medication Sig Dispense Refill   Ascorbic Acid (VITAMIN C) 1000 MG tablet Take 1,000 mg by mouth daily.     b complex vitamins capsule Take 1 capsule by mouth daily.     Bacillus Coagulans-Inulin (PROBIOTIC) 1-250 BILLION-MG CAPS Take by mouth daily.      Cholecalciferol 125 MCG (5000 UT) capsule Take 5,000 Units by mouth daily.      enzalutamide (XTANDI) 40 MG capsule Take 3 capsules (120 mg total) by mouth daily. 90 capsule 3   Ginseng 100 MG CAPS Take by mouth daily.      L-ARGININE PO Take 1,000 mg by mouth daily.      losartan (COZAAR)  25 MG tablet TAKE 1 TABLET(25 MG) BY MOUTH DAILY 90 tablet 3   melatonin 1 MG TABS tablet Take 2 mg by mouth at bedtime.      methylphenidate (RITALIN) 5 MG tablet Take 0.5 tablets (2.5 mg total) by mouth daily as needed. 10 tablet 0   Probiotic Product (ALIGN) 4 MG CAPS Take 1 capsule by mouth daily.      tamsulosin (FLOMAX) 0.4 MG CAPS capsule TAKE 1 CAPSULE(0.4 MG) BY MOUTH DAILY 90 capsule 3   zinc gluconate 50 MG tablet Take 50 mg by mouth daily.     No current facility-administered medications for this visit.   Facility-Administered Medications Ordered in Other Visits  Medication Dose Route Frequency Provider Last Rate Last Admin   fulvestrant (FASLODEX) 250 MG/5ML injection             ALLERGIES:  No Known Allergies  PHYSICAL EXAM:  Performance status (ECOG): 0 - Asymptomatic  Vitals:   08/22/22 1541  BP: (!) 166/94  Pulse: 65  Resp: 18  Temp: 97.8 F (36.6 C)  SpO2: 99%   Wt Readings from Last 3 Encounters:  08/22/22 144 lb 14.4 oz (65.7 kg)  05/16/22 149 lb 4.8 oz (67.7 kg)  02/27/22 146 lb 6.4 oz (66.4 kg)   Physical Exam Vitals reviewed.  Constitutional:      Appearance: Normal appearance.   Cardiovascular:     Rate and Rhythm: Normal rate and regular rhythm.     Pulses: Normal pulses.     Heart sounds: Normal heart sounds.  Pulmonary:     Effort: Pulmonary effort is normal.     Breath sounds: Normal breath sounds.  Musculoskeletal:     Right lower leg: No edema.     Left lower leg: No edema.  Neurological:     General: No focal deficit present.     Mental Status: He is alert and oriented to person, place, and time.  Psychiatric:        Mood and Affect: Mood normal.        Behavior: Behavior normal.      LABORATORY DATA:  I have reviewed the labs as listed.     Latest Ref Rng & Units 08/15/2022    3:41 PM 05/09/2022    2:13 PM 01/16/2022    1:38 PM  CBC  WBC 4.0 - 10.5 K/uL 5.2  4.1  4.2   Hemoglobin 13.0 - 17.0 g/dL 14.6  14.1  15.5   Hematocrit 39.0 - 52.0 % 44.2  42.4  47.4   Platelets 150 - 400 K/uL 284  250  264       Latest Ref Rng & Units 08/15/2022    3:41 PM 05/09/2022    2:13 PM 01/16/2022    1:38 PM  CMP  Glucose 70 - 99 mg/dL 100  100  98   BUN 8 - 23 mg/dL '15  13  12   '$ Creatinine 0.61 - 1.24 mg/dL 0.92  0.95  1.03   Sodium 135 - 145 mmol/L 136  140  139   Potassium 3.5 - 5.1 mmol/L 4.1  4.3  4.0   Chloride 98 - 111 mmol/L 100  103  105   CO2 22 - 32 mmol/L '27  29  29   '$ Calcium 8.9 - 10.3 mg/dL 9.3  9.2  9.3   Total Protein 6.5 - 8.1 g/dL 7.0  6.7  7.1   Total Bilirubin 0.3 - 1.2 mg/dL 0.7  1.0  0.9  Alkaline Phos 38 - 126 U/L 53  48  53   AST 15 - 41 U/L '23  22  25   '$ ALT 0 - 44 U/L '13  14  15     '$ DIAGNOSTIC IMAGING:  I have independently reviewed the scans and discussed with the patient. No results found.   ASSESSMENT:  1.  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 -External beam radiation with 2 years of Lupron from 05/27/2013 through 07/28/2013.  Received Lupron until 09/28/2014. -Rising PSA levels in December 2018, of 2.8.  Local recurrence  was confirmed with biopsy. -Bone scan on 04/12/2018 did not show any evidence of metastatic disease. -He was evaluated by Dr. Iona Beard at [facility].  Bone scan and CT scan at [facility] on 07/02/2018 did not show any evidence of metastatic disease.  Left para-aortic lymph node measures 8 mm.  Lupron started for nonmetastatic HSPC in November 2019 for rapid PSA doubling time of less than 3 months.  He had difficulty tolerating Lupron with irritability, low energy and fatigue. -PSA 5.7 (07/08/2019), PSA 11.1 (11/26/2019) with testosterone 18. -Fluciclovine PET CT scan on 12/23/2019 showed T11 meta stasis.  Asymmetric hypermetabolism within the prostate suspicious for residual disease.  Mild enlargement of 2 adjacent lower left para-aortic nodes, largest 8 mm with SUV of 3.4.  These nodes measured maximally 5 mm on PET scan on 02/25/2018. -Abiraterone 500 mg daily and prednisone from 01/03/2020 through 02/16/2020, discontinued secondary to elevated LFTs. -We will consider checking germline and somatic mutation testing. -Lupron 45 mg on 02/17/2020 at Dr. Alan Ripper office. -Enzalutamide 80 mg daily started on 03/10/2020.   PLAN:  1.  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 - Continues to have fatigue, anxiety and irritability in a roller coaster pattern. - Reviewed labs from 08/15/2022 which showed normal creatinine, LFTs and calcium.  CBC was grossly normal.  PSA is 1.28, up from 0.65.  Testosterone is 24. - PSA has doubled in last 3 months.  We talked about various options including increasing the dose of enzalutamide versus of treatment with docetaxel. - He is willing to try enzalutamide at 120 mg daily and see if it helps.  We have sent a prescription to his pharmacy.  He will receive Lupron injection today. - RTC 8 weeks for follow-up with repeat PSA and CBC and CMP.   2.  Bone metastasis: - Continue calcium and vitamin D  supplements.  Last vitamin D was normal.   3.  Heterozygosity for MUTYH mutation: - He has refused Cologuard test.   4.  Hypertension: - Continue losartan 25 mg daily.  Blood pressure is 160/94.  His blood pressure is usually high in the office.   5.  Fatigue: - He is not requiring Ritalin on a consistent basis.  He usually takes Ritalin 1/5 of 5 mg tablet when needed.   Orders placed this encounter:  Orders Placed This Encounter  Procedures   CBC with Differential/Platelet   Comprehensive metabolic panel   PSA     Derek Jack, MD [facility] [phone]

## 2022-08-23 ENCOUNTER — Inpatient Hospital Stay: Payer: Medicare Other

## 2022-08-24 ENCOUNTER — Ambulatory Visit (INDEPENDENT_AMBULATORY_CARE_PROVIDER_SITE_OTHER): Payer: Medicare Other | Admitting: Psychologist

## 2022-08-24 DIAGNOSIS — F331 Major depressive disorder, recurrent, moderate: Secondary | ICD-10-CM | POA: Diagnosis not present

## 2022-08-24 DIAGNOSIS — F411 Generalized anxiety disorder: Secondary | ICD-10-CM

## 2022-08-24 NOTE — Progress Notes (Signed)
[facility] Counselor/Therapist Progress Note  Patient ID: Nathan Smith, MRN: [id number],    Date: 08/24/2022  Time Spent: 02:07 pm to 02:45 pm; total time: 38 minutes  This session was held via in person. The patient consented to in-person therapy and was in the clinician's office. Limits of confidentiality were discussed with the patient  Treatment Type: Individual Therapy  Reported Symptoms: Some fears related to progression of cancer  Mental Status Exam: Appearance:  Well Groomed     Behavior: Appropriate  Motor: Normal  Speech/Language:  Normal Rate  Affect: Appropriate  Mood: normal  Thought process: normal  Thought content:   WNL  Sensory/Perceptual disturbances:   WNL  Orientation: oriented to person, place, time/date, and situation  Attention: Good  Concentration: Good  Memory: WNL  Fund of knowledge:  Good  Insight:   Fair  Judgment:  Fair  Impulse Control: Good   Risk Assessment: Danger to Self:  No Self-injurious Behavior: No Danger to Others: No Duty to Warn:no Physical Aggression / Violence:No  Access to Firearms a concern: No  Gang Involvement:No   Subjective: Patient described himself as doing okay while reviewing the holidays. From there, he reflected on his most recent oncology visit, which indicated that the cancer might be progressing. He processed thoughts and emotions about this. From there, he reflected on different fears he experiences and ways he attempts to cope with fears. He was agreeable to following up. He denied suicidal and homicidal ideation.    Interventions: Worked on developing a therapeutic relationship with the patient using active listening and reflective statements. Provided emotional support using empathy and validation. Used summary and reflective statements. Reviewed  how the holidays went. Normalized and validated thoughts and emotions. Processed how the oncology appointment went. Identified goals for the session.  Began exploring the idea of forgiving self. Then processed some of the fears patient expressed. Normalized and validated fears. Began to explore how to respond to fears. Discussed next step for counseling. Assessed for suicidal and homicidal ideation.   Homework: Reflect on fears  Next Session: Continue to process fears and emotional support  Diagnosis: F33.1 major depressive affective disorder, recurrent, moderate and F41.1 generalized anxiety disorder  Plan:    Goals Alleviate depressive symptoms Recognize, accept, and cope with depressive feelings Develop healthy thinking patterns Develop healthy interpersonal relationships Reduce overall frequency, intensity, and duration of anxiety Stabilize anxiety level wile increasing ability to function Enhance ability to effectively cope with full variety of stressors Learn and implement coping skills that result in a reduction of anxiety   Objectives target date for all objectives is 04/08/2023 Verbalize an understanding of the cognitive, physiological, and behavioral components of anxiety Learning and implement calming skills to reduce overall anxiety Verbalize an understanding of the role that cognitive biases play in excessive irrational worry and persistent anxiety symptoms Identify, challenge, and replace based fearful talk Learn and implement problem solving strategies Identify and engage in pleasant activities Learning and implement personal and interpersonal skills to reduce anxiety and improve interpersonal relationships Learn to accept limitations in life and commit to tolerating, rather than avoiding, unpleasant emotions while accomplishing meaningful goals Identify major life conflicts from the past and present that form the basis for present anxiety Maintain involvement in work, family, and social activities Reestablish a consistent sleep-wake cycle Cooperate with a medical evaluation  Cooperate with a medication evaluation by  a physician Verbalize an accurate understanding of depression Verbalize an understanding of the treatment Identify and replace thoughts  that support depression Learn and implement behavioral strategies Verbalize an understanding and resolution of current interpersonal problems Learn and implement problem solving and decision making skills Learn and implement conflict resolution skills to resolve interpersonal problems Verbalize an understanding of healthy and unhealthy emotions verbalize insight into how past relationships may be influence current experiences with depression Use mindfulness and acceptance strategies and increase value based behavior  Increase hopeful statements about the future.  Interventions Engage the patient in behavioral activation Use instruction, modeling, and role-playing to build the client's general social, communication, and/or conflict resolution skills Use Acceptance and Commitment Therapy to help client accept uncomfortable realities in order to accomplish value-consistent goals Reinforce the client's insight into the role of his/her past emotional pain and present anxiety  Support the client in following through with work, family, and social activities Teach and implement sleep hygiene practices  Refer the patient to a physician for a psychotropic medication consultation Monito the clint's psychotropic medication compliance Discuss how anxiety typically involves excessive worry, various bodily expressions of tension, and avoidance of what is threatening that interact to maintain the problem  Teach the patient relaxation skills Assign the patient homework Discuss examples demonstrating that unrealistic worry overestimates the probability of threats and underestimates patient's ability  Assist the patient in analyzing his or her worries Help patient understand that avoidance is reinforcing  Consistent with treatment model, discuss how change in cognitive,  behavioral, and interpersonal can help client alleviate depression CBT Behavioral activation help the client explore the relationship, nature of the dispute,  Help the client develop new interpersonal skills and relationships Conduct Problem solving therapy Teach conflict resolution skills Use a process-experiential approach Conduct TLDP Conduct ACT Evaluate need for psychotropic medication Monitor adherence to medication   The patient and clinician reviewed the treatment plan on 04/26/2021. The patient and clinician reevaluated on 9.12.2023. The patient approved of the treatment.  Conception Chancy, PsyD

## 2022-08-28 ENCOUNTER — Other Ambulatory Visit (HOSPITAL_COMMUNITY): Payer: Self-pay

## 2022-08-28 ENCOUNTER — Other Ambulatory Visit: Payer: Self-pay | Admitting: Pharmacist

## 2022-08-28 DIAGNOSIS — C61 Malignant neoplasm of prostate: Secondary | ICD-10-CM

## 2022-08-28 MED ORDER — ENZALUTAMIDE 40 MG PO TABS
120.0000 mg | ORAL_TABLET | Freq: Every day | ORAL | 2 refills | Status: DC
  Filled 2022-08-28 (×2): qty 90, 30d supply, fill #0
  Filled 2022-09-20: qty 90, 30d supply, fill #1

## 2022-08-28 NOTE — Progress Notes (Signed)
Order changes from Xtandi capsules to tablets

## 2022-08-29 ENCOUNTER — Ambulatory Visit: Payer: Medicare Other | Admitting: Psychologist

## 2022-09-18 ENCOUNTER — Other Ambulatory Visit (HOSPITAL_COMMUNITY): Payer: Self-pay

## 2022-09-20 ENCOUNTER — Other Ambulatory Visit (HOSPITAL_COMMUNITY): Payer: Self-pay

## 2022-09-21 ENCOUNTER — Ambulatory Visit (INDEPENDENT_AMBULATORY_CARE_PROVIDER_SITE_OTHER): Payer: Medicare Other | Admitting: Psychologist

## 2022-09-21 DIAGNOSIS — F331 Major depressive disorder, recurrent, moderate: Secondary | ICD-10-CM | POA: Diagnosis not present

## 2022-09-21 DIAGNOSIS — F411 Generalized anxiety disorder: Secondary | ICD-10-CM

## 2022-09-21 NOTE — Progress Notes (Signed)
[facility] Counselor/Therapist Progress Note  Patient ID: Nathan Smith, MRN: [id number],    Date: 09/21/2022  Time Spent: 02:06 pm to 02:46 pm; total time: 40 minutes  This session was held via in person. The patient consented to in-person therapy and was in the clinician's office. Limits of confidentiality were discussed with the patient  Treatment Type: Individual Therapy  Reported Symptoms: Some fears due to a variety of concerns  Mental Status Exam: Appearance:  Well Groomed     Behavior: Appropriate  Motor: Normal  Speech/Language:  Normal Rate  Affect: Appropriate  Mood: normal  Thought process: normal  Thought content:   WNL  Sensory/Perceptual disturbances:   WNL  Orientation: oriented to person, place, time/date, and situation  Attention: Good  Concentration: Good  Memory: WNL  Fund of knowledge:  Good  Insight:   Fair  Judgment:  Fair  Impulse Control: Good   Risk Assessment: Danger to Self:  No Self-injurious Behavior: No Danger to Others: No Duty to Warn:no Physical Aggression / Violence:No  Access to Firearms a concern: No  Gang Involvement:No   Subjective: Patient described himself as doing okay reflecting on events since the last session. He voiced that he has experienced some stomach pain and other pains that were a little challenging for him. From there, he voiced that he is working on managing his diet. He reflected on fears related to living arrangements, loss of independence, etc. He voiced that he is watching the news less. He also is focused on doing a positive thing daily for others in his life. He was agreeable to following up. He denied suicidal and homicidal ideation.    Interventions: Worked on developing a therapeutic relationship with the patient using active listening and reflective statements. Provided emotional support using empathy and validation. Used summary and reflective statements. Used summary statements. Praised the  patient for doing okay. Reviewed points that were discussed in previous session. Identified goals for the session. Used socractic questions to assist the patient. Validated the concerns. Processed some of the fears related to housing. Explored potential options. Praised patient for watching the news less frequently. Reflected on how patient has implemented his own tools to assist him. Processed the thoughts and emotions. Provided empathic statements. Assessed for suicidal and homicidal ideation.   Homework: Identify ways to continue doing positive acts for others  Next Session: Emotional support  Diagnosis: F33.1 major depressive affective disorder, recurrent, moderate and F41.1 generalized anxiety disorder  Plan:    Goals Alleviate depressive symptoms Recognize, accept, and cope with depressive feelings Develop healthy thinking patterns Develop healthy interpersonal relationships Reduce overall frequency, intensity, and duration of anxiety Stabilize anxiety level wile increasing ability to function Enhance ability to effectively cope with full variety of stressors Learn and implement coping skills that result in a reduction of anxiety   Objectives target date for all objectives is 04/08/2023 Verbalize an understanding of the cognitive, physiological, and behavioral components of anxiety Learning and implement calming skills to reduce overall anxiety Verbalize an understanding of the role that cognitive biases play in excessive irrational worry and persistent anxiety symptoms Identify, challenge, and replace based fearful talk Learn and implement problem solving strategies Identify and engage in pleasant activities Learning and implement personal and interpersonal skills to reduce anxiety and improve interpersonal relationships Learn to accept limitations in life and commit to tolerating, rather than avoiding, unpleasant emotions while accomplishing meaningful goals Identify major life  conflicts from the past and present that form the basis  for present anxiety Maintain involvement in work, family, and social activities Reestablish a consistent sleep-wake cycle Cooperate with a medical evaluation  Cooperate with a medication evaluation by a physician Verbalize an accurate understanding of depression Verbalize an understanding of the treatment Identify and replace thoughts that support depression Learn and implement behavioral strategies Verbalize an understanding and resolution of current interpersonal problems Learn and implement problem solving and decision making skills Learn and implement conflict resolution skills to resolve interpersonal problems Verbalize an understanding of healthy and unhealthy emotions verbalize insight into how past relationships may be influence current experiences with depression Use mindfulness and acceptance strategies and increase value based behavior  Increase hopeful statements about the future.  Interventions Engage the patient in behavioral activation Use instruction, modeling, and role-playing to build the client's general social, communication, and/or conflict resolution skills Use Acceptance and Commitment Therapy to help client accept uncomfortable realities in order to accomplish value-consistent goals Reinforce the client's insight into the role of his/her past emotional pain and present anxiety  Support the client in following through with work, family, and social activities Teach and implement sleep hygiene practices  Refer the patient to a physician for a psychotropic medication consultation Monito the clint's psychotropic medication compliance Discuss how anxiety typically involves excessive worry, various bodily expressions of tension, and avoidance of what is threatening that interact to maintain the problem  Teach the patient relaxation skills Assign the patient homework Discuss examples demonstrating that unrealistic  worry overestimates the probability of threats and underestimates patient's ability  Assist the patient in analyzing his or her worries Help patient understand that avoidance is reinforcing  Consistent with treatment model, discuss how change in cognitive, behavioral, and interpersonal can help client alleviate depression CBT Behavioral activation help the client explore the relationship, nature of the dispute,  Help the client develop new interpersonal skills and relationships Conduct Problem solving therapy Teach conflict resolution skills Use a process-experiential approach Conduct TLDP Conduct ACT Evaluate need for psychotropic medication Monitor adherence to medication   The patient and clinician reviewed the treatment plan on 04/26/2021. The patient and clinician reevaluated on 9.12.2023. The patient approved of the treatment.  Conception Chancy, PsyD

## 2022-09-26 ENCOUNTER — Other Ambulatory Visit (HOSPITAL_COMMUNITY): Payer: Self-pay

## 2022-10-03 ENCOUNTER — Ambulatory Visit (INDEPENDENT_AMBULATORY_CARE_PROVIDER_SITE_OTHER): Payer: Medicare Other | Admitting: Psychologist

## 2022-10-03 DIAGNOSIS — F331 Major depressive disorder, recurrent, moderate: Secondary | ICD-10-CM | POA: Diagnosis not present

## 2022-10-03 DIAGNOSIS — F411 Generalized anxiety disorder: Secondary | ICD-10-CM

## 2022-10-03 NOTE — Progress Notes (Signed)
[facility] Counselor/Therapist Progress Note  Patient ID: Nathan Smith, MRN: [id number],    Date: 10/03/2022  Time Spent: 03:07 pm to 03:45 pm; total time: 38 minutes  This session was held via in person. The patient consented to in-person therapy and was in the clinician's office. Limits of confidentiality were discussed with the patient  Treatment Type: Individual Therapy  Reported Symptoms: Denied distress  Mental Status Exam: Appearance:  Well Groomed     Behavior: Appropriate  Motor: Normal  Speech/Language:  Normal Rate  Affect: Appropriate  Mood: normal  Thought process: normal  Thought content:   WNL  Sensory/Perceptual disturbances:   WNL  Orientation: oriented to person, place, time/date, and situation  Attention: Good  Concentration: Good  Memory: WNL  Fund of knowledge:  Good  Insight:   Fair  Judgment:  Fair  Impulse Control: Good   Risk Assessment: Danger to Self:  No Self-injurious Behavior: No Danger to Others: No Duty to Warn:no Physical Aggression / Violence:No  Access to Firearms a concern: No  Gang Involvement:No   Subjective: Patient described himself as doing well stating that he is not experiencing distress. Per the patient, he voiced he is attempting to understand why, so that he can have a repeat of the same experience. Patient processed thoughts and emotions. He reflected on the idea of not chasing the experience. Patient stated appreciation related to being informed of the transition. He was agreeable to following up. He denied suicidal and homicidal ideation.    Interventions: Worked on developing a therapeutic relationship with the patient using active listening and reflective statements. Provided emotional support using empathy and validation. Used summary and reflective statements. Praised the patient for doing well. Identified goals for the session. Processed the thoughts and emotions patient was experiencing related to  current state. Used socratic questions to assist the patient. Challenged some of the thoughts expressed by the patient. Explored the idea of just enjoying the moment. Disclosed to patient regarding the transition. Processed thoughts and emotions. Provided empathic statements. Assessed for suicidal and homicidal ideation.   Homework: NA  Next Session: Emotional support. Review results from labs and medical appointment  Diagnosis: F33.1 major depressive affective disorder, recurrent, moderate and F41.1 generalized anxiety disorder  Plan:    Goals Alleviate depressive symptoms Recognize, accept, and cope with depressive feelings Develop healthy thinking patterns Develop healthy interpersonal relationships Reduce overall frequency, intensity, and duration of anxiety Stabilize anxiety level wile increasing ability to function Enhance ability to effectively cope with full variety of stressors Learn and implement coping skills that result in a reduction of anxiety   Objectives target date for all objectives is 04/08/2023 Verbalize an understanding of the cognitive, physiological, and behavioral components of anxiety Learning and implement calming skills to reduce overall anxiety Verbalize an understanding of the role that cognitive biases play in excessive irrational worry and persistent anxiety symptoms Identify, challenge, and replace based fearful talk Learn and implement problem solving strategies Identify and engage in pleasant activities Learning and implement personal and interpersonal skills to reduce anxiety and improve interpersonal relationships Learn to accept limitations in life and commit to tolerating, rather than avoiding, unpleasant emotions while accomplishing meaningful goals Identify major life conflicts from the past and present that form the basis for present anxiety Maintain involvement in work, family, and social activities Reestablish a consistent sleep-wake  cycle Cooperate with a medical evaluation  Cooperate with a medication evaluation by a physician Verbalize an accurate understanding of depression Verbalize an  understanding of the treatment Identify and replace thoughts that support depression Learn and implement behavioral strategies Verbalize an understanding and resolution of current interpersonal problems Learn and implement problem solving and decision making skills Learn and implement conflict resolution skills to resolve interpersonal problems Verbalize an understanding of healthy and unhealthy emotions verbalize insight into how past relationships may be influence current experiences with depression Use mindfulness and acceptance strategies and increase value based behavior  Increase hopeful statements about the future.  Interventions Engage the patient in behavioral activation Use instruction, modeling, and role-playing to build the client's general social, communication, and/or conflict resolution skills Use Acceptance and Commitment Therapy to help client accept uncomfortable realities in order to accomplish value-consistent goals Reinforce the client's insight into the role of his/her past emotional pain and present anxiety  Support the client in following through with work, family, and social activities Teach and implement sleep hygiene practices  Refer the patient to a physician for a psychotropic medication consultation Monito the clint's psychotropic medication compliance Discuss how anxiety typically involves excessive worry, various bodily expressions of tension, and avoidance of what is threatening that interact to maintain the problem  Teach the patient relaxation skills Assign the patient homework Discuss examples demonstrating that unrealistic worry overestimates the probability of threats and underestimates patient's ability  Assist the patient in analyzing his or her worries Help patient understand that avoidance  is reinforcing  Consistent with treatment model, discuss how change in cognitive, behavioral, and interpersonal can help client alleviate depression CBT Behavioral activation help the client explore the relationship, nature of the dispute,  Help the client develop new interpersonal skills and relationships Conduct Problem solving therapy Teach conflict resolution skills Use a process-experiential approach Conduct TLDP Conduct ACT Evaluate need for psychotropic medication Monitor adherence to medication   The patient and clinician reviewed the treatment plan on 04/26/2021. The patient and clinician reevaluated on 9.12.2023. The patient approved of the treatment.  Conception Chancy, PsyD

## 2022-10-10 ENCOUNTER — Inpatient Hospital Stay: Payer: Medicare Other | Attending: Hematology

## 2022-10-10 DIAGNOSIS — C7951 Secondary malignant neoplasm of bone: Secondary | ICD-10-CM | POA: Diagnosis present

## 2022-10-10 DIAGNOSIS — R5383 Other fatigue: Secondary | ICD-10-CM | POA: Diagnosis not present

## 2022-10-10 DIAGNOSIS — R110 Nausea: Secondary | ICD-10-CM | POA: Diagnosis not present

## 2022-10-10 DIAGNOSIS — Z923 Personal history of irradiation: Secondary | ICD-10-CM | POA: Diagnosis not present

## 2022-10-10 DIAGNOSIS — Z1509 Genetic susceptibility to other malignant neoplasm: Secondary | ICD-10-CM | POA: Insufficient documentation

## 2022-10-10 DIAGNOSIS — C61 Malignant neoplasm of prostate: Secondary | ICD-10-CM | POA: Insufficient documentation

## 2022-10-10 DIAGNOSIS — R197 Diarrhea, unspecified: Secondary | ICD-10-CM | POA: Diagnosis not present

## 2022-10-10 DIAGNOSIS — Z79899 Other long term (current) drug therapy: Secondary | ICD-10-CM | POA: Diagnosis not present

## 2022-10-10 DIAGNOSIS — I10 Essential (primary) hypertension: Secondary | ICD-10-CM | POA: Diagnosis not present

## 2022-10-10 LAB — COMPREHENSIVE METABOLIC PANEL
ALT: 12 U/L (ref 0–44)
AST: 20 U/L (ref 15–41)
Albumin: 3.9 g/dL (ref 3.5–5.0)
Alkaline Phosphatase: 54 U/L (ref 38–126)
Anion gap: 8 (ref 5–15)
BUN: 13 mg/dL (ref 8–23)
CO2: 28 mmol/L (ref 22–32)
Calcium: 9.1 mg/dL (ref 8.9–10.3)
Chloride: 100 mmol/L (ref 98–111)
Creatinine, Ser: 0.9 mg/dL (ref 0.61–1.24)
GFR, Estimated: >60 mL/min (ref >60)
Glucose, Bld: 96 mg/dL (ref 70–99)
Potassium: 4.1 mmol/L (ref 3.5–5.1)
Sodium: 136 mmol/L (ref 135–145)
Total Bilirubin: 0.7 mg/dL (ref 0.3–1.2)
Total Protein: 6.9 g/dL (ref 6.5–8.1)

## 2022-10-10 LAB — CBC WITH DIFFERENTIAL/PLATELET
Abs Immature Granulocytes: 0.02 10*3/uL (ref 0.00–0.07)
Basophils Absolute: 0 10*3/uL (ref 0.0–0.1)
Basophils Relative: 1 %
Eosinophils Absolute: 0.1 10*3/uL (ref 0.0–0.5)
Eosinophils Relative: 2 %
HCT: 45.2 % (ref 39.0–52.0)
Hemoglobin: 14.9 g/dL (ref 13.0–17.0)
Immature Granulocytes: 0 %
Lymphocytes Relative: 17 %
Lymphs Abs: 0.8 10*3/uL (ref 0.7–4.0)
MCH: 33.2 pg (ref 26.0–34.0)
MCHC: 33 g/dL (ref 30.0–36.0)
MCV: 100.7 fL — ABNORMAL HIGH (ref 80.0–100.0)
Monocytes Absolute: 0.5 10*3/uL (ref 0.1–1.0)
Monocytes Relative: 10 %
Neutro Abs: 3.4 10*3/uL (ref 1.7–7.7)
Neutrophils Relative %: 70 %
Platelets: 251 10*3/uL (ref 150–400)
RBC: 4.49 MIL/uL (ref 4.22–5.81)
RDW: 13.6 % (ref 11.5–15.5)
WBC: 4.9 10*3/uL (ref 4.0–10.5)
nRBC: 0 % (ref 0.0–0.2)

## 2022-10-10 LAB — PSA: Prostatic Specific Antigen: 2.19 ng/mL (ref 0.00–4.00)

## 2022-10-17 ENCOUNTER — Inpatient Hospital Stay (HOSPITAL_BASED_OUTPATIENT_CLINIC_OR_DEPARTMENT_OTHER): Payer: Medicare Other | Admitting: Hematology

## 2022-10-17 ENCOUNTER — Other Ambulatory Visit: Payer: Medicare Other

## 2022-10-17 VITALS — BP 145/89 | HR 66 | Temp 98.9°F | Resp 18 | Ht 72.44 in | Wt 164.0 lb

## 2022-10-17 DIAGNOSIS — C61 Malignant neoplasm of prostate: Secondary | ICD-10-CM

## 2022-10-17 NOTE — Progress Notes (Signed)
[place] [address] [place] [address]    Clinic Day:  10/17/2022  Referring physician: Derek Jack, MD  Patient Care Team: Patient, No Pcp Per as PCP - General (General Practice) Derek Jack, MD as Consulting Physician (Oncology) Donetta Potts, RN as Oncology Nurse Navigator (Oncology)   ASSESSMENT & PLAN:   Assessment: 1.  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 -External beam radiation with 2 years of Lupron from 05/27/2013 through 07/28/2013.  Received Lupron until 09/28/2014. -Rising PSA levels in December 2018, of 2.8.  Local recurrence was confirmed with biopsy. -Bone scan on 04/12/2018 did not show any evidence of metastatic disease. -He was evaluated by Dr. Iona Beard at [facility].  Bone scan and CT scan at [facility] on 07/02/2018 did not show any evidence of metastatic disease.  Left para-aortic lymph node measures 8 mm.  Lupron started for nonmetastatic HSPC in November 2019 for rapid PSA doubling time of less than 3 months.  He had difficulty tolerating Lupron with irritability, low energy and fatigue. -PSA 5.7 (07/08/2019), PSA 11.1 (11/26/2019) with testosterone 18. -Fluciclovine PET CT scan on 12/23/2019 showed T11 meta stasis.  Asymmetric hypermetabolism within the prostate suspicious for residual disease.  Mild enlargement of 2 adjacent lower left para-aortic nodes, largest 8 mm with SUV of 3.4.  These nodes measured maximally 5 mm on PET scan on 02/25/2018. -Abiraterone 500 mg daily and prednisone from 01/03/2020 through 02/16/2020, discontinued secondary to elevated LFTs. -We will consider checking germline and somatic mutation testing. -Lupron 45 mg on 02/17/2020 at Dr. Alan Ripper office. -Enzalutamide from 03/10/2020 through 10/17/2022 with progression  Plan: 1.  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 - He experienced more fatigue and sleepiness after the dose increased. - Reviewed labs today which showed normal LFTs and CBC.  PSA increased to 2.19 from 1.28 on 08/15/2022.  PSA was 0.65 on 05/09/2022. - I have recommended that he stop enzalutamide. - Germline mutation testing was negative for targetable mutations. - I have recommended Guardant360 for somatic mutation testing. - I have recommended PSMA PET CT scan to evaluate the current status of his malignancy. - We have discussed various options including docetaxel, olaparib if BRCA mutation found on Guardant360 testing, pembrolizumab if MSI high.  RTC after the PET scan.   2.  Bone metastasis: - Continue calcium and vitamin D supplements.   3.  Heterozygosity for MUTYH mutation: - Cologuard test was refused previously.   4.  Hypertension: - Continue losartan 25 mg daily.  Blood pressure today is 145/89.   5.  Fatigue: - He is not requiring Ritalin on a consistent basis.  Usually takes Ritalin 1/5 of 5 mg tablet when needed.  Orders Placed This Encounter  Procedures   NM PET (PSMA) SKULL TO MID THIGH    Next available    Standing Status:   Future    Standing Expiration Date:   10/17/2023    Order Specific Question:   If indicated for the ordered procedure, I authorize the administration of a radiopharmaceutical per Radiology protocol    Answer:   Yes    Order Specific Question:   Preferred imaging location?    Answer:   [facility]    Order Specific Question:   Release to patient    Answer:  Immediate      I,[name],acting as a scribe for Derek Jack, MD.,have documented all relevant documentation on the behalf of Derek Jack, MD,as directed by  Derek Jack, MD while in the presence of Derek Jack, MD.   I, Derek Jack MD, have reviewed the above documentation for accuracy and completeness, and I agree with the above.   Derek Jack, MD   3/12/20244:46 PM  CHIEF COMPLAINT:   Diagnosis: castration resistant prostate cancer    Cancer Staging  Prostate cancer [facility]) Staging form: Prostate, AJCC 7th Edition - Clinical stage from 12/26/2019: Stage IV (yTX, N1, M1b, PSA: 10 to 19, Gleason 8-10) - Signed by Derek Jack, MD on 12/26/2019    Prior Therapy: External beam radiation from 05/27/2013 to 07/28/2013   Current Therapy:  Lupron every 6 months; enzalutamide 80 mg QD    HISTORY OF PRESENT ILLNESS:   Oncology History  Prostate cancer ([place])  09/24/2012 Initial Diagnosis   Prostate cancer ([place])   12/26/2019 Cancer Staging   Staging form: Prostate, AJCC 7th Edition - Clinical stage from 12/26/2019: Stage IV (yTX, N1, M1b, PSA: 10 to 19, Gleason 8-10) - Signed by Derek Jack, MD on 12/26/2019   08/24/2020 Genetic Testing   Positive genetic testing:  A single, 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 Testing was completed through the Common Hereditary Cancers panel and Prostate Cancer HRR panel offered by [facility] laboratories. The report date is 08/24/2020.   The Common Hereditary Cancers Panel offered by [facility] includes sequencing and/or deletion duplication testing of the following 47 genes: APC, ATM, AXIN2, BARD1, BMPR1A, BRCA1, BRCA2, BRIP1, CDH1, CDK4, CDKN2A (p14ARF), CDKN2A (p16INK4a), CHEK2, CTNNA1, DICER1, EPCAM (Deletion/duplication testing only), GREM1 (promoter region deletion/duplication testing only), KIT, MEN1, MLH1, MSH2, MSH3, MSH6, MUTYH, NBN, NF1, NTHL1, PALB2, PDGFRA, PMS2, POLD1, POLE, PTEN, RAD50, RAD51C, RAD51D, SDHB, SDHC, SDHD, SMAD4, SMARCA4. STK11, TP53, TSC1, TSC2, and VHL.  The following genes were evaluated for sequence changes only: SDHA and HOXB13 c.251G>A variant only. The Prostate Cancer HRR Panel offered by [facility] includes sequencing and/or deletion/duplication analysis of the following 10 genes: ATM, BARD1, BRCA1, BRCA2,  BRIP1, CHEK2, FANCL, PALB2, RAD51C, and RAD51D.      INTERVAL HISTORY:   Nathan Smith is a 72 y.o. male presenting to clinic today for follow up of castration resistant prostate cancer. He was last seen by me on 08/22/22.  Today, he states that he is doing well overall. His appetite level is at 100%. His energy level is at 70%. He denies any new onset pains.  However he has noted increased tiredness and sleepiness since the dose of Xtandi increased to 120 mg daily.   PAST MEDICAL HISTORY:   Past Medical History: Past Medical History:  Diagnosis Date   Anxiety    Bilateral hydronephrosis    Bladder incontinence    night time,  past 2 months   BPH (benign prostatic hypertrophy) with urinary obstruction    Depression    ED (erectile dysfunction)    H/O ascites    trace   Hypertension    Prostate cancer ([place]) 09/24/12   gleason 4+3=7., & 4+4=8,PSA=67.30, volume=33.7cc    Surgical History: Past Surgical History:  Procedure Laterality Date   PROSTATE BIOPSY  09/24/12   Adenocarcinoma   PROSTATE BIOPSY N/A 12/20/2017   Procedure: BIOPSY TRANSRECTAL ULTRASONIC PROSTATE (TUBP);  Surgeon: Franchot Gallo, MD;  Location: [facility];  Service: Urology;  Laterality: N/A;   TONSILLECTOMY  Social History: Social History   Socioeconomic History   Marital status: Single    Spouse name: Not on file   Number of children: 0   Years of education: Not on file   Highest education level: Not on file  Occupational History   Occupation: [name]    Employer: [employer]  Tobacco Use   Smoking status: Never   Smokeless tobacco: Never  Vaping Use   Vaping Use: Never used  Substance and Sexual Activity   Alcohol use: Yes    Alcohol/week: 2.0 standard drinks of alcohol    Types: 2 Cans of beer per week    Comment: 3 per day, beer/wine , hx etoh abuse years ago   Drug use: No    Comment: hx   Sexual activity: Yes    Birth control/protection: Condom  Other Topics  Concern   Not on file  Social History Narrative   Not on file   Social Determinants of Health   Financial Resource Strain: Low Risk  (12/08/2019)   Overall Financial Resource Strain (CARDIA)    Difficulty of Paying Living Expenses: Not very hard  Food Insecurity: No Food Insecurity (12/08/2019)   Hunger Vital Sign    Worried About Running Out of Food in the Last Year: Never true    Ran Out of Food in the Last Year: Never true  Transportation Needs: No Transportation Needs (12/08/2019)   PRAPARE - Hydrologist (Medical): No    Lack of Transportation (Non-Medical): No  Physical Activity: Sufficiently Active (12/08/2019)   Exercise Vital Sign    Days of Exercise per Week: 6 days    Minutes of Exercise per Session: 60 min  Stress: Stress Concern Present (12/08/2019)   [place]    Feeling of Stress : To some extent  Social Connections: Unknown (12/08/2019)   Social Connection and Isolation Panel [NHANES]    Frequency of Communication with Friends and Family: Twice a week    Frequency of Social Gatherings with Friends and Family: Once a week    Attends Religious Services: More than 4 times per year    Active Member of [employer] or Organizations: No    Attends Music therapist: More than 4 times per year    Marital Status: Not on file  Intimate Partner Violence: Not At Risk (12/08/2019)   Humiliation, Afraid, Rape, and Kick questionnaire    Fear of Current or Ex-Partner: No    Emotionally Abused: No    Physically Abused: No    Sexually Abused: No    Family History: Family History  Problem Relation Age of Onset   Depression Mother    Dementia Mother    Anxiety disorder Mother    Prostate cancer Father        unconfirmed diagnosis    Current Medications:  Current Outpatient Medications:    Ascorbic Acid (VITAMIN C) 1000 MG tablet, Take 1,000 mg by mouth daily., Disp: , Rfl:    b  complex vitamins capsule, Take 1 capsule by mouth daily., Disp: , Rfl:    Bacillus Coagulans-Inulin (PROBIOTIC) 1-250 BILLION-MG CAPS, Take by mouth daily. , Disp: , Rfl:    Cholecalciferol 125 MCG (5000 UT) capsule, Take 5,000 Units by mouth daily. , Disp: , Rfl:    enzalutamide (XTANDI) 40 MG tablet, Take 3 tablets (120 mg total) by mouth daily., Disp: 90 tablet, Rfl: 2   Ginseng 100 MG  CAPS, Take by mouth daily. , Disp: , Rfl:    L-ARGININE PO, Take 1,000 mg by mouth daily. , Disp: , Rfl:    losartan (COZAAR) 25 MG tablet, TAKE 1 TABLET(25 MG) BY MOUTH DAILY, Disp: 90 tablet, Rfl: 3   melatonin 1 MG TABS tablet, Take 2 mg by mouth at bedtime. , Disp: , Rfl:    methylphenidate (RITALIN) 5 MG tablet, Take 0.5 tablets (2.5 mg total) by mouth daily as needed., Disp: 10 tablet, Rfl: 0   Probiotic Product (ALIGN) 4 MG CAPS, Take 1 capsule by mouth daily. , Disp: , Rfl:    tamsulosin (FLOMAX) 0.4 MG CAPS capsule, TAKE 1 CAPSULE(0.4 MG) BY MOUTH DAILY, Disp: 90 capsule, Rfl: 3   zinc gluconate 50 MG tablet, Take 50 mg by mouth daily., Disp: , Rfl:  No current facility-administered medications for this visit.  Facility-Administered Medications Ordered in Other Visits:    fulvestrant (FASLODEX) 250 MG/5ML injection, , , ,    Allergies: No Known Allergies  REVIEW OF SYSTEMS:   Review of Systems  Constitutional:  Positive for fatigue. Negative for chills and fever.  HENT:   Negative for lump/mass, mouth sores, nosebleeds, sore throat and trouble swallowing.   Eyes:  Negative for eye problems.  Respiratory:  Negative for cough and shortness of breath.   Cardiovascular:  Negative for chest pain, leg swelling and palpitations.  Gastrointestinal:  Positive for nausea. Negative for abdominal pain, constipation, diarrhea and vomiting.  Genitourinary:  Negative for bladder incontinence, difficulty urinating, dysuria, frequency, hematuria and nocturia.   Musculoskeletal:  Negative for arthralgias, back  pain, flank pain, myalgias and neck pain.  Skin:  Negative for itching and rash.  Neurological:  Negative for dizziness, headaches and numbness.  Hematological:  Does not bruise/bleed easily.  Psychiatric/Behavioral:  Positive for depression. Negative for sleep disturbance and suicidal ideas. The patient is nervous/anxious.   All other systems reviewed and are negative.    VITALS:   Blood pressure (!) 145/89, pulse 66, temperature 98.9 F (37.2 C), temperature source Tympanic, resp. rate 18, height 6' 0.44" (1.84 m), weight 164 lb (74.4 kg), SpO2 98 %.  Wt Readings from Last 3 Encounters:  10/17/22 164 lb (74.4 kg)  08/22/22 144 lb 14.4 oz (65.7 kg)  05/16/22 149 lb 4.8 oz (67.7 kg)    Body mass index is 21.97 kg/m.  Performance status (ECOG): 1 - Symptomatic but completely ambulatory  PHYSICAL EXAM:   Physical Exam Vitals and nursing note reviewed. Exam conducted with a chaperone present.  Constitutional:      Appearance: Normal appearance.  Cardiovascular:     Rate and Rhythm: Normal rate and regular rhythm.     Pulses: Normal pulses.     Heart sounds: Normal heart sounds.  Pulmonary:     Effort: Pulmonary effort is normal.     Breath sounds: Normal breath sounds.  Abdominal:     Palpations: Abdomen is soft. There is no hepatomegaly, splenomegaly or mass.     Tenderness: There is no abdominal tenderness.  Musculoskeletal:     Right lower leg: No edema.     Left lower leg: No edema.  Lymphadenopathy:     Cervical: No cervical adenopathy.     Right cervical: No superficial, deep or posterior cervical adenopathy.    Left cervical: No superficial, deep or posterior cervical adenopathy.     Upper Body:     Right upper body: No supraclavicular or axillary adenopathy.     Left  upper body: No supraclavicular or axillary adenopathy.  Neurological:     General: No focal deficit present.     Mental Status: He is alert and oriented to person, place, and time.  Psychiatric:         Mood and Affect: Mood normal.        Behavior: Behavior normal.     LABS:      Latest Ref Rng & Units 10/10/2022    2:33 PM 08/15/2022    3:41 PM 05/09/2022    2:13 PM  CBC  WBC 4.0 - 10.5 K/uL 4.9  5.2  4.1   Hemoglobin 13.0 - 17.0 g/dL 14.9  14.6  14.1   Hematocrit 39.0 - 52.0 % 45.2  44.2  42.4   Platelets 150 - 400 K/uL 251  284  250       Latest Ref Rng & Units 10/10/2022    2:33 PM 08/15/2022    3:41 PM 05/09/2022    2:13 PM  CMP  Glucose 70 - 99 mg/dL 96  100  100   BUN 8 - 23 mg/dL '13  15  13   '$ Creatinine 0.61 - 1.24 mg/dL 0.90  0.92  0.95   Sodium 135 - 145 mmol/L 136  136  140   Potassium 3.5 - 5.1 mmol/L 4.1  4.1  4.3   Chloride 98 - 111 mmol/L 100  100  103   CO2 22 - 32 mmol/L '28  27  29   '$ Calcium 8.9 - 10.3 mg/dL 9.1  9.3  9.2   Total Protein 6.5 - 8.1 g/dL 6.9  7.0  6.7   Total Bilirubin 0.3 - 1.2 mg/dL 0.7  0.7  1.0   Alkaline Phos 38 - 126 U/L 54  53  48   AST 15 - 41 U/L '20  23  22   '$ ALT 0 - 44 U/L '12  13  14      '$ No results found for: "CEA1", "CEA" / No results found for: "CEA1", "CEA" Lab Results  Component Value Date   PSA1 0.3 08/30/2021   No results found for: "[id number]" No results found for: "CAN125"  No results found for: "TOTALPROTELP", "ALBUMINELP", "A1GS", "A2GS", "BETS", "BETA2SER", "GAMS", "MSPIKE", "SPEI" No results found for: "TIBC", "FERRITIN", "IRONPCTSAT" Lab Results  Component Value Date   LDH 129 03/22/2020     STUDIES:   No results found.

## 2022-10-17 NOTE — Patient Instructions (Addendum)
[facility]  Discharge Instructions  You were seen and examined today by Dr. Delton Coombes.  Dr. Delton Coombes discussed your most recent lab work which revealed that your PSA is rising.   Dr. Delton Coombes is going to draw Guardant 360 and also set you up for a PSMA PET scan.  Stop taking Xstandi.   Follow-up as scheduled after scan.    Thank you for choosing [facility] to provide your oncology and hematology care.   To afford each patient quality time with our provider, please arrive at least 15 minutes before your scheduled appointment time. You may need to reschedule your appointment if you arrive late (10 or more minutes). Arriving late affects you and other patients whose appointments are after yours.  Also, if you miss three or more appointments without notifying the office, you may be dismissed from the clinic at the provider's discretion.    Again, thank you for choosing [facility].  Our hope is that these requests will decrease the amount of time that you wait before being seen by our physicians.   If you have a lab appointment with the [facility] please come in thru the Main Entrance and check in at the main information desk.           _____________________________________________________________  Should you have questions after your visit to [facility], please contact our office at [phone] and follow the prompts.  Our office hours are 8:00 a.m. to 4:30 p.m. Monday - Thursday and 8:00 a.m. to 2:30 p.m. Friday.  Please note that voicemails left after 4:00 p.m. may not be returned until the following business day.  We are closed weekends and all major holidays.  You do have access to a nurse 24-7, just call the main number to the clinic [phone] and do not press any options, hold on the line and a nurse will answer the phone.    For prescription refill requests, have your pharmacy contact our  office and allow 72 hours.    Masks are optional in the cancer centers. If you would like for your care team to wear a mask while they are taking care of you, please let them know. You may have one support person who is at least 70 years old accompany you for your appointments.

## 2022-10-19 ENCOUNTER — Ambulatory Visit (INDEPENDENT_AMBULATORY_CARE_PROVIDER_SITE_OTHER): Payer: Medicare Other | Admitting: Psychologist

## 2022-10-19 DIAGNOSIS — F411 Generalized anxiety disorder: Secondary | ICD-10-CM | POA: Diagnosis not present

## 2022-10-19 DIAGNOSIS — F331 Major depressive disorder, recurrent, moderate: Secondary | ICD-10-CM

## 2022-10-19 NOTE — Progress Notes (Signed)
[facility] Counselor/Therapist Progress Note  Patient ID: Nathan Smith, MRN: [id number],    Date: 10/19/2022  Time Spent: 02:07 pm to 02:45 pm; total time: 38 minutes  This session was held via in person. The patient consented to in-person therapy and was in the clinician's office. Limits of confidentiality were discussed with the patient  Treatment Type: Individual Therapy  Reported Symptoms: Little distress  Mental Status Exam: Appearance:  Well Groomed     Behavior: Appropriate  Motor: Normal  Speech/Language:  Normal Rate  Affect: Appropriate  Mood: normal  Thought process: normal  Thought content:   WNL  Sensory/Perceptual disturbances:   WNL  Orientation: oriented to person, place, time/date, and situation  Attention: Good  Concentration: Good  Memory: WNL  Fund of knowledge:  Good  Insight:   Fair  Judgment:  Fair  Impulse Control: Good   Risk Assessment: Danger to Self:  No Self-injurious Behavior: No Danger to Others: No Duty to Warn:no Physical Aggression / Violence:No  Access to Firearms a concern: No  Gang Involvement:No   Subjective: Patient described himself as doing well while talking about previous events. He disclosed that recent labs have indicated that medications are not being as effective managing his cancer. Patient is to undergo further testing before deciding on possible other treatment options. He processed his thoughts about this development. He also talked about not wanting to live in a care facility, if he loses his independence. He discussed possible options available. He was agreeable to following up. He denied suicidal and homicidal ideation.    Interventions: Worked on developing a therapeutic relationship with the patient using active listening and reflective statements. Provided emotional support using empathy and validation. Used summary and reflective statements. Explored upcoming events in the life of the patient.  Praised the patient for doing well. Validated how behavioral steps patient has taken may be helping the patient. Identified goals for the session. Reflected on most recent lab results and what it means for him moving forward for further cancer treatment. Challenged some of the thoughts expressed. Identified concern of loss of independence. Validated the concern. Processed what this means for the patient. Processed different options for the patient related to living arrangements. Processed thoughts and emotions. Discussed next steps Provided empathic statements. Assessed for suicidal and homicidal ideation.   Homework: NA  Next Session: Emotional support. Review results. Process relationship and transition to new therapist. Terminate therapist relationship  Diagnosis: F33.1 major depressive affective disorder, recurrent, moderate and F41.1 generalized anxiety disorder  Plan:    Goals Alleviate depressive symptoms Recognize, accept, and cope with depressive feelings Develop healthy thinking patterns Develop healthy interpersonal relationships Reduce overall frequency, intensity, and duration of anxiety Stabilize anxiety level wile increasing ability to function Enhance ability to effectively cope with full variety of stressors Learn and implement coping skills that result in a reduction of anxiety   Objectives target date for all objectives is 04/08/2023 Verbalize an understanding of the cognitive, physiological, and behavioral components of anxiety Learning and implement calming skills to reduce overall anxiety Verbalize an understanding of the role that cognitive biases play in excessive irrational worry and persistent anxiety symptoms Identify, challenge, and replace based fearful talk Learn and implement problem solving strategies Identify and engage in pleasant activities Learning and implement personal and interpersonal skills to reduce anxiety and improve interpersonal  relationships Learn to accept limitations in life and commit to tolerating, rather than avoiding, unpleasant emotions while accomplishing meaningful goals Identify major life conflicts  from the past and present that form the basis for present anxiety Maintain involvement in work, family, and social activities Reestablish a consistent sleep-wake cycle Cooperate with a medical evaluation  Cooperate with a medication evaluation by a physician Verbalize an accurate understanding of depression Verbalize an understanding of the treatment Identify and replace thoughts that support depression Learn and implement behavioral strategies Verbalize an understanding and resolution of current interpersonal problems Learn and implement problem solving and decision making skills Learn and implement conflict resolution skills to resolve interpersonal problems Verbalize an understanding of healthy and unhealthy emotions verbalize insight into how past relationships may be influence current experiences with depression Use mindfulness and acceptance strategies and increase value based behavior  Increase hopeful statements about the future.  Interventions Engage the patient in behavioral activation Use instruction, modeling, and role-playing to build the client's general social, communication, and/or conflict resolution skills Use Acceptance and Commitment Therapy to help client accept uncomfortable realities in order to accomplish value-consistent goals Reinforce the client's insight into the role of his/her past emotional pain and present anxiety  Support the client in following through with work, family, and social activities Teach and implement sleep hygiene practices  Refer the patient to a physician for a psychotropic medication consultation Monito the clint's psychotropic medication compliance Discuss how anxiety typically involves excessive worry, various bodily expressions of tension, and avoidance of  what is threatening that interact to maintain the problem  Teach the patient relaxation skills Assign the patient homework Discuss examples demonstrating that unrealistic worry overestimates the probability of threats and underestimates patient's ability  Assist the patient in analyzing his or her worries Help patient understand that avoidance is reinforcing  Consistent with treatment model, discuss how change in cognitive, behavioral, and interpersonal can help client alleviate depression CBT Behavioral activation help the client explore the relationship, nature of the dispute,  Help the client develop new interpersonal skills and relationships Conduct Problem solving therapy Teach conflict resolution skills Use a process-experiential approach Conduct TLDP Conduct ACT Evaluate need for psychotropic medication Monitor adherence to medication   The patient and clinician reviewed the treatment plan on 04/26/2021. The patient and clinician reevaluated on 9.12.2023. The patient approved of the treatment.  Conception Chancy, PsyD

## 2022-10-23 ENCOUNTER — Other Ambulatory Visit: Payer: Self-pay

## 2022-10-23 ENCOUNTER — Other Ambulatory Visit (HOSPITAL_COMMUNITY): Payer: Self-pay

## 2022-10-26 ENCOUNTER — Encounter (HOSPITAL_COMMUNITY)
Admission: RE | Admit: 2022-10-26 | Discharge: 2022-10-26 | Disposition: A | Discharge status: Home / Self Care | Payer: Medicare Other | Source: Ambulatory Visit | Attending: Hematology | Admitting: Hematology

## 2022-10-26 DIAGNOSIS — C61 Malignant neoplasm of prostate: Secondary | ICD-10-CM | POA: Insufficient documentation

## 2022-10-26 MED ORDER — PIFLIFOLASTAT F 18 (PYLARIFY) INJECTION
9.0000 | Freq: Once | INTRAVENOUS | Status: AC
  Administered 2022-10-26: 9.35 via INTRAVENOUS

## 2022-10-26 MED ORDER — FLUDEOXYGLUCOSE F - 18 (FDG) INJECTION: 9.3500 | Freq: Once | INTRAVENOUS | Status: DC | PRN

## 2022-11-01 ENCOUNTER — Other Ambulatory Visit (HOSPITAL_COMMUNITY): Payer: Self-pay

## 2022-11-02 ENCOUNTER — Inpatient Hospital Stay (HOSPITAL_BASED_OUTPATIENT_CLINIC_OR_DEPARTMENT_OTHER): Payer: Medicare Other | Admitting: Hematology

## 2022-11-02 ENCOUNTER — Encounter: Payer: Self-pay | Admitting: Hematology

## 2022-11-02 VITALS — BP 138/85 | HR 68 | Temp 97.3°F | Resp 16 | Wt 141.2 lb

## 2022-11-02 DIAGNOSIS — C61 Malignant neoplasm of prostate: Secondary | ICD-10-CM | POA: Diagnosis not present

## 2022-11-02 NOTE — Patient Instructions (Signed)
Nathan Smith  Discharge Instructions  You were seen and examined today by Dr. Delton Coombes.  Dr. Delton Coombes has reviewed your recent PET scan which revealed progression of the cancer. Dr. Delton Coombes has also reviewed your Guardant 360 results, which did not reveal any targetable mutations to be treated with different pills or immunotherapy.  Dr. Delton Coombes has recommended starting chemotherapy. Chemotherapy is given here in the [facility] once every 3 weeks.  Prior to the start of chemotherapy, you will need a Port-A-Cath place. You will also meet with our chemotherapy educator to discuss your chemotherapy regimen in detail.  Follow-up as scheduled.  Thank you for choosing [facility] to provide your oncology and hematology care.   To afford each patient quality time with our provider, please arrive at least 15 minutes before your scheduled appointment time. You may need to reschedule your appointment if you arrive late (10 or more minutes). Arriving late affects you and other patients whose appointments are after yours.  Also, if you miss three or more appointments without notifying the office, you may be dismissed from the clinic at the provider's discretion.    Again, thank you for choosing [facility].  Our hope is that these requests will decrease the amount of time that you wait before being seen by our physicians.   If you have a lab appointment with the [facility] please come in thru the Main Entrance and check in at the main information desk.           _____________________________________________________________  Should you have questions after your visit to [facility], please contact our office at [phone] and follow the prompts.  Our office hours are 8:00 a.m. to 4:30 p.m. Monday - Thursday and 8:00 a.m. to 2:30 p.m. Friday.  Please note that voicemails left after 4:00 p.m. may not be  returned until the following business day.  We are closed weekends and all major holidays.  You do have access to a nurse 24-7, just call the main number to the clinic [phone] and do not press any options, hold on the line and a nurse will answer the phone.    For prescription refill requests, have your pharmacy contact our office and allow 72 hours.    Masks are optional in the cancer centers. If you would like for your care team to wear a mask while they are taking care of you, please let them know. You may have one support person who is at least 70 years old accompany you for your appointments.

## 2022-11-02 NOTE — Progress Notes (Signed)
START ON PATHWAY REGIMEN - Prostate     A cycle is every 21 days.:     Cabazitaxel      Prednisone   **Always confirm dose/schedule in your pharmacy ordering system**  Patient Characteristics: Adenocarcinoma, Recurrent/New Systemic Disease (Including Biochemical Recurrence), Castration Resistant, M1, Prior Novel Hormonal Agent, No Molecular Alteration or Targeted Therapy Exhausted, Prior Docetaxel/Docetaxel Not Indicated Histology: Adenocarcinoma Therapeutic Status: Recurrent/New Systemic Disease (Including Biochemical Recurrence)  Intent of Therapy: Non-Curative / Palliative Intent, Discussed with Patient 

## 2022-11-02 NOTE — Progress Notes (Signed)
[place] [address], [place] [address]    Clinic Day:  11/02/2022  Referring physician: Derek Jack, MD  Patient Care Team: Patient, No Pcp Per as PCP - General (General Practice) Nathan Jack, MD as Consulting Physician (Oncology) Donetta Potts, RN as Oncology Nurse Navigator (Oncology)   ASSESSMENT & PLAN:   Assessment: 1.  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 -External beam radiation with 2 years of Lupron from 05/27/2013 through 07/28/2013.  Received Lupron until 09/28/2014. -Rising PSA levels in December 2018, of 2.8.  Local recurrence was confirmed with biopsy. -Bone scan on 04/12/2018 did not show any evidence of metastatic disease. -He was evaluated by Dr. Iona Beard at [facility].  Bone scan and CT scan at [facility] on 07/02/2018 did not show any evidence of metastatic disease.  Left para-aortic lymph node measures 8 mm.  Lupron started for nonmetastatic HSPC in November 2019 for rapid PSA doubling time of less than 3 months.  He had difficulty tolerating Lupron with irritability, low energy and fatigue. -PSA 5.7 (07/08/2019), PSA 11.1 (11/26/2019) with testosterone 18. -Fluciclovine PET CT scan on 12/23/2019 showed T11 meta stasis.  Asymmetric hypermetabolism within the prostate suspicious for residual disease.  Mild enlargement of 2 adjacent lower left para-aortic nodes, largest 8 mm with SUV of 3.4.  These nodes measured maximally 5 mm on PET scan on 02/25/2018. -Abiraterone 500 mg daily and prednisone from 01/03/2020 through 02/16/2020, discontinued secondary to elevated LFTs. -We will consider checking germline and somatic mutation testing. -Lupron 45 mg on 02/17/2020 at Dr. Alan Ripper office. -Enzalutamide from 03/10/2020 through 10/17/2022 with progression. - Guardant360: MSI high not detected. NOTCH1 mutation. - Recommended cabazitaxel to minimize  incidence of neuropathy as he is a Therapist, nutritional and plays guitar.  Plan: 1.  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 - PSMA PET scan (10/26/2022): T11 vertebral lesion 2.7 x 2.6 cm, previously 2.4 cm.  Nodular uptake in the right side of the prostate gland.  Left periaortic lymph node 5 mm.  No evidence of liver metastasis. - We reviewed Guardant360 results which did not show any targetable mutations. - He lost about 5 pounds in the last 2 weeks.  Appetite is good. 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  He is concerned about neuropathy when we discussed next treatment with docetaxel. - Hence I have recommended cabazitaxel as it has lower likelihood of neuropathy. - We discussed the cabazitaxel side effects in detail. - If he cannot tolerate chemotherapy, will consider Pluvicto. - We discussed need for port placement for chemotherapy.  Will schedule it by IR. - He will start chemo after the port placement.   2.  Bone metastasis: - Continue calcium and vitamin D supplements.   3.  Heterozygosity for MUTYH mutation: - Cologuard test was refused previously.   4.  Hypertension: - Continue losartan 25 mg daily.  Blood pressure is 138/85.   5.  Fatigue: - He has used 1/5 of 5 mg tablet of Ritalin when needed in the past. - We discussed possibility of worsening fatigue after starting chemotherapy. - We also discussed resuming Ritalin as needed. - He could not tolerate low-dose prednisone well.  Orders Placed This Encounter  Procedures   IR IMAGING GUIDED PORT INSERTION    Standing Status:   Future  Standing Expiration Date:   11/02/2023    Order Specific Question:   Reason for Exam (SYMPTOM  OR DIAGNOSIS REQUIRED)    Answer:   chemotherapy administration    Order Specific Question:   Preferred Imaging Location?    Answer:   [facility]    Order Specific Question:   Release to patient    Answer:    Immediate     I,Nathan Smith,acting as a scribe for Nathan Jack, MD.,have documented all relevant documentation on the behalf of Nathan Jack, MD,as directed by  Nathan Jack, MD while in the presence of Nathan Jack, MD.  I, Nathan Jack MD, have reviewed the above documentation for accuracy and completeness, and I agree with the above.    Nathan Jack, MD   3/28/20245:04 PM  CHIEF COMPLAINT:   Diagnosis: castration resistant prostate cancer    Cancer Staging  Prostate cancer [facility]) Staging form: Prostate, AJCC 7th Edition - Clinical stage from 12/26/2019: Stage IV (yTX, N1, M1b, PSA: 10 to 19, Gleason 8-10) - Signed by Nathan Jack, MD on 12/26/2019    Prior Therapy: External beam radiation from 05/27/2013 to 07/28/2013   Current Therapy:  Lupron every 6 months; enzalutamide 80 mg QD   HISTORY OF PRESENT ILLNESS:   Oncology History  Prostate cancer ([place])  09/24/2012 Initial Diagnosis   Prostate cancer ([place])   12/26/2019 Cancer Staging   Staging form: Prostate, AJCC 7th Edition - Clinical stage from 12/26/2019: Stage IV (yTX, N1, M1b, PSA: 10 to 19, Gleason 8-10) - Signed by Nathan Jack, MD on 12/26/2019   08/24/2020 Genetic Testing   Positive genetic testing:  A single, 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 Testing was completed through the Common Hereditary Cancers panel and Prostate Cancer HRR panel offered by [facility] laboratories. The report date is 08/24/2020.   The Common Hereditary Cancers Panel offered by [facility] includes sequencing and/or deletion duplication testing of the following 47 genes: APC, ATM, AXIN2, BARD1, BMPR1A, BRCA1, BRCA2, BRIP1, CDH1, CDK4, CDKN2A (p14ARF), CDKN2A (p16INK4a), CHEK2, CTNNA1, DICER1, EPCAM (Deletion/duplication testing only), GREM1 (promoter region deletion/duplication testing only), KIT, MEN1, MLH1, MSH2, MSH3, MSH6, MUTYH, NBN, NF1,  NTHL1, PALB2, PDGFRA, PMS2, POLD1, POLE, PTEN, RAD50, RAD51C, RAD51D, SDHB, SDHC, SDHD, SMAD4, SMARCA4. STK11, TP53, TSC1, TSC2, and VHL.  The following genes were evaluated for sequence changes only: SDHA and HOXB13 c.251G>A variant only. The Prostate Cancer HRR Panel offered by [facility] includes sequencing and/or deletion/duplication analysis of the following 10 genes: ATM, BARD1, BRCA1, BRCA2, BRIP1, CHEK2, FANCL, PALB2, RAD51C, and RAD51D.   11/16/2022 -  Chemotherapy   Patient is on Treatment Plan : PROSTATE Cabazitaxel (20) D1 + Prednisone D1-21 q21d        INTERVAL HISTORY:   Nathan Smith is a 72 y.o. male presenting to clinic today for follow up of castration resistant prostate cancer. He was last seen by me on 10/17/22.  Today, he states that he is doing well overall. His appetite level is at 100%. His energy level is at 75%. He states that when he was taking Ritalin he had improvement of his energy level but with some increased anxiety at times. He reports unintentional weight loss of 5lbs in the last x2 weeks. However, he states that he has a good appetite and is eating regularly. He notes some mild nausea intermittently but this does not prevent him from eating. He reports intermittent diarrhea and feels that this may be related to food allergies. He states  that he is tolerating the Lupron okay. He denies any new pains or back pain.  PAST MEDICAL HISTORY:   Past Medical History: Past Medical History:  Diagnosis Date   Anxiety    Bilateral hydronephrosis    Bladder incontinence    night time,  past 2 months   BPH (benign prostatic hypertrophy) with urinary obstruction    Depression    ED (erectile dysfunction)    H/O ascites    trace   Hypertension    Prostate cancer ([place]) 09/24/12   gleason 4+3=7., & 4+4=8,PSA=67.30, volume=33.7cc    Surgical History: Past Surgical History:  Procedure Laterality Date   PROSTATE BIOPSY  09/24/12   Adenocarcinoma   PROSTATE BIOPSY N/A  12/20/2017   Procedure: BIOPSY TRANSRECTAL ULTRASONIC PROSTATE (TUBP);  Surgeon: Franchot Gallo, MD;  Location: [facility];  Service: Urology;  Laterality: N/A;   TONSILLECTOMY      Social History: Social History   Socioeconomic History   Marital status: Single    Spouse name: Not on file   Number of children: 0   Years of education: Not on file   Highest education level: Not on file  Occupational History   Occupation: [name]    Employer: [employer]  Tobacco Use   Smoking status: Never   Smokeless tobacco: Never  Vaping Use   Vaping Use: Never used  Substance and Sexual Activity   Alcohol use: Yes    Alcohol/week: 2.0 standard drinks of alcohol    Types: 2 Cans of beer per week    Comment: 3 per day, beer/wine , hx etoh abuse years ago   Drug use: No    Comment: hx   Sexual activity: Yes    Birth control/protection: Condom  Other Topics Concern   Not on file  Social History Narrative   Not on file   Social Determinants of Health   Financial Resource Strain: Low Risk  (12/08/2019)   Overall Financial Resource Strain (CARDIA)    Difficulty of Paying Living Expenses: Not very hard  Food Insecurity: No Food Insecurity (12/08/2019)   Hunger Vital Sign    Worried About Running Out of Food in the Last Year: Never true    Ran Out of Food in the Last Year: Never true  Transportation Needs: No Transportation Needs (12/08/2019)   PRAPARE - Hydrologist (Medical): No    Lack of Transportation (Non-Medical): No  Physical Activity: Sufficiently Active (12/08/2019)   Exercise Vital Sign    Days of Exercise per Week: 6 days    Minutes of Exercise per Session: 60 min  Stress: Stress Concern Present (12/08/2019)   [place]    Feeling of Stress : To some extent  Social Connections: Unknown (12/08/2019)   Social Connection and Isolation Panel [NHANES]    Frequency of  Communication with Friends and Family: Twice a week    Frequency of Social Gatherings with Friends and Family: Once a week    Attends Religious Services: More than 4 times per year    Active Member of [employer] or Organizations: No    Attends Music therapist: More than 4 times per year    Marital Status: Not on file  Intimate Partner Violence: Not At Risk (12/08/2019)   Humiliation, Afraid, Rape, and Kick questionnaire    Fear of Current or Ex-Partner: No    Emotionally Abused: No    Physically Abused: No  Sexually Abused: No    Family History: Family History  Problem Relation Age of Onset   Depression Mother    Dementia Mother    Anxiety disorder Mother    Prostate cancer Father        unconfirmed diagnosis    Current Medications:  Current Outpatient Medications:    Ascorbic Acid (VITAMIN C) 1000 MG tablet, Take 1,000 mg by mouth daily., Disp: , Rfl:    b complex vitamins capsule, Take 1 capsule by mouth daily., Disp: , Rfl:    Bacillus Coagulans-Inulin (PROBIOTIC) 1-250 BILLION-MG CAPS, Take by mouth daily. , Disp: , Rfl:    Cholecalciferol 125 MCG (5000 UT) capsule, Take 5,000 Units by mouth daily. , Disp: , Rfl:    Ginseng 100 MG CAPS, Take by mouth daily. , Disp: , Rfl:    L-ARGININE PO, Take 1,000 mg by mouth daily. , Disp: , Rfl:    losartan (COZAAR) 25 MG tablet, TAKE 1 TABLET(25 MG) BY MOUTH DAILY, Disp: 90 tablet, Rfl: 3   melatonin 1 MG TABS tablet, Take 2 mg by mouth at bedtime. , Disp: , Rfl:    methylphenidate (RITALIN) 5 MG tablet, Take 0.5 tablets (2.5 mg total) by mouth daily as needed., Disp: 10 tablet, Rfl: 0   Probiotic Product (ALIGN) 4 MG CAPS, Take 1 capsule by mouth daily. , Disp: , Rfl:    tamsulosin (FLOMAX) 0.4 MG CAPS capsule, TAKE 1 CAPSULE(0.4 MG) BY MOUTH DAILY, Disp: 90 capsule, Rfl: 3   zinc gluconate 50 MG tablet, Take 50 mg by mouth daily., Disp: , Rfl:  No current facility-administered medications for this  visit.  Facility-Administered Medications Ordered in Other Visits:    fulvestrant (FASLODEX) 250 MG/5ML injection, , , ,    Allergies: No Known Allergies  REVIEW OF SYSTEMS:   Review of Systems  Constitutional:  Negative for chills, fatigue and fever.  HENT:   Negative for lump/mass, mouth sores, nosebleeds, sore throat and trouble swallowing.   Eyes:  Negative for eye problems.  Respiratory:  Negative for cough and shortness of breath.   Cardiovascular:  Negative for chest pain, leg swelling and palpitations.  Gastrointestinal:  Positive for nausea. Negative for abdominal pain, constipation, diarrhea and vomiting.  Endocrine: Positive for hot flashes.  Genitourinary:  Negative for bladder incontinence, difficulty urinating, dysuria, frequency, hematuria and nocturia.   Musculoskeletal:  Negative for arthralgias, back pain, flank pain, myalgias and neck pain.  Skin:  Negative for itching and rash.  Neurological:  Negative for dizziness, headaches and numbness.  Hematological:  Does not bruise/bleed easily.  Psychiatric/Behavioral:  Negative for depression, sleep disturbance and suicidal ideas. The patient is not nervous/anxious.   All other systems reviewed and are negative.    VITALS:   Blood pressure 138/85, pulse 68, temperature (!) 97.3 F (36.3 C), temperature source Oral, resp. rate 16, weight 141 lb 3.2 oz (64 kg), SpO2 100 %.  Wt Readings from Last 3 Encounters:  11/02/22 141 lb 3.2 oz (64 kg)  10/17/22 164 lb (74.4 kg)  08/22/22 144 lb 14.4 oz (65.7 kg)    Body mass index is 18.92 kg/m.  Performance status (ECOG): 1 - Symptomatic but completely ambulatory  PHYSICAL EXAM:   Physical Exam Vitals and nursing note reviewed. Exam conducted with a chaperone present.  Constitutional:      Appearance: Normal appearance.  Cardiovascular:     Rate and Rhythm: Normal rate and regular rhythm.     Pulses: Normal pulses.  Heart sounds: Normal heart sounds.  Pulmonary:      Effort: Pulmonary effort is normal.     Breath sounds: Normal breath sounds.  Abdominal:     Palpations: Abdomen is soft. There is no hepatomegaly, splenomegaly or mass.     Tenderness: There is no abdominal tenderness.  Musculoskeletal:     Right lower leg: No edema.     Left lower leg: No edema.  Lymphadenopathy:     Cervical: No cervical adenopathy.     Right cervical: No superficial, deep or posterior cervical adenopathy.    Left cervical: No superficial, deep or posterior cervical adenopathy.     Upper Body:     Right upper body: No supraclavicular or axillary adenopathy.     Left upper body: No supraclavicular or axillary adenopathy.  Neurological:     General: No focal deficit present.     Mental Status: He is alert and oriented to person, place, and time.  Psychiatric:        Mood and Affect: Mood normal.        Behavior: Behavior normal.     LABS:      Latest Ref Rng & Units 10/10/2022    2:33 PM 08/15/2022    3:41 PM 05/09/2022    2:13 PM  CBC  WBC 4.0 - 10.5 K/uL 4.9  5.2  4.1   Hemoglobin 13.0 - 17.0 g/dL 14.9  14.6  14.1   Hematocrit 39.0 - 52.0 % 45.2  44.2  42.4   Platelets 150 - 400 K/uL 251  284  250       Latest Ref Rng & Units 10/10/2022    2:33 PM 08/15/2022    3:41 PM 05/09/2022    2:13 PM  CMP  Glucose 70 - 99 mg/dL 96  100  100   BUN 8 - 23 mg/dL 13  15  13    Creatinine 0.61 - 1.24 mg/dL 0.90  0.92  0.95   Sodium 135 - 145 mmol/L 136  136  140   Potassium 3.5 - 5.1 mmol/L 4.1  4.1  4.3   Chloride 98 - 111 mmol/L 100  100  103   CO2 22 - 32 mmol/L 28  27  29    Calcium 8.9 - 10.3 mg/dL 9.1  9.3  9.2   Total Protein 6.5 - 8.1 g/dL 6.9  7.0  6.7   Total Bilirubin 0.3 - 1.2 mg/dL 0.7  0.7  1.0   Alkaline Phos 38 - 126 U/L 54  53  48   AST 15 - 41 U/L 20  23  22    ALT 0 - 44 U/L 12  13  14       No results found for: "CEA1", "CEA" / No results found for: "CEA1", "CEA" Lab Results  Component Value Date   PSA1 0.3 08/30/2021   No results found  for: "[id number]" No results found for: "CAN125"  No results found for: "TOTALPROTELP", "ALBUMINELP", "A1GS", "A2GS", "BETS", "BETA2SER", "GAMS", "MSPIKE", "SPEI" No results found for: "TIBC", "FERRITIN", "IRONPCTSAT" Lab Results  Component Value Date   LDH 129 03/22/2020     STUDIES:   NM PET (PSMA) SKULL TO MID THIGH  Result Date: 10/29/2022 CLINICAL DATA:  Prostate carcinoma restaging. EXAM: NUCLEAR MEDICINE PET SKULL BASE TO THIGH TECHNIQUE: 9.35 mCi F18 Piflufolastat (Pylarify) was injected intravenously. Full-ring PET imaging was performed from the skull base to thigh after the radiotracer. CT data was obtained and used for attenuation correction and anatomic  localization. COMPARISON:  Multiple priors including most recent Fluciclovine PET-CT Dec 23, 2019 FINDINGS: NECK No radiotracer activity in neck lymph nodes. Incidental CT finding: None. CHEST No radiotracer accumulation within mediastinal or hilar lymph nodes. No radiotracer avid pulmonary nodules or masses. Incidental CT finding: Aortic atherosclerosis. Coronary artery calcifications. Minimal biapical pleuroparenchymal scarring. Hypoventilatory change in the lung bases. Motion degraded examination of the lungs reveals no suspicious pulmonary nodules or masses. ABDOMEN/PELVIS Prostate: Nodular uptake along the right side of the prostate adjacent to a brachytherapy seeds with a max SUV of 6.9. Lymph nodes: Minimally radiotracer avid left periaortic lymph node measures 5 mm in short axis on image 153/3 with a max SUV of 4.1. Liver: No evidence of liver metastasis. Incidental CT finding: Symmetric wall thickening of the urinary bladder with bladder wall diverticula and trabeculations. Large right inguinal hernia contains fat and nonobstructed bowel. Aortic atherosclerosis. SKELETON Radiotracer avid mixed lytic and sclerotic lesion in the T11 vertebral body measures 2.7 x 2.6 cm on image 207/3 with a max SUV of 94.9. IMPRESSION: 1. Radiotracer avid  mixed lytic and sclerotic lesion in the T11 vertebral body consistent with osseous metastatic disease. 2. Nodular uptake along the right side of the prostate adjacent to a brachytherapy seeds is compatible with local recurrence. 3. Minimally radiotracer avid left periaortic lymph node measures 5 mm, suspicious for nodal metastatic disease. 4. No evidence of liver metastasis. 5. Large right inguinal hernia contains fat and nonobstructed bowel. 6. Symmetric wall thickening of the urinary bladder with bladder wall diverticula and trabeculations, consistent with chronic bladder outlet obstruction. 7.  Aortic Atherosclerosis (ICD10-I70.0). Electronically Signed   By: Dahlia Bailiff M.D.   On: 10/29/2022 09:58

## 2022-11-03 ENCOUNTER — Other Ambulatory Visit: Payer: Self-pay

## 2022-11-07 ENCOUNTER — Ambulatory Visit (INDEPENDENT_AMBULATORY_CARE_PROVIDER_SITE_OTHER): Payer: Medicare Other | Admitting: Psychologist

## 2022-11-07 ENCOUNTER — Other Ambulatory Visit: Payer: Self-pay

## 2022-11-07 DIAGNOSIS — F411 Generalized anxiety disorder: Secondary | ICD-10-CM

## 2022-11-07 DIAGNOSIS — F331 Major depressive disorder, recurrent, moderate: Secondary | ICD-10-CM | POA: Diagnosis not present

## 2022-11-07 NOTE — Progress Notes (Signed)
[facility] Counselor/Therapist Progress Note  Patient ID: Nathan Smith, MRN: [id number],    Date: 11/07/2022  Time Spent: 02:00 pm to 02:29 pm; total time: 29 minutes  This session was held via in person. The patient consented to in-person therapy and was in the clinician's office. Limits of confidentiality were discussed with the patient  Treatment Type: Individual Therapy  Reported Symptoms: Little distress  Mental Status Exam: Appearance:  Well Groomed     Behavior: Appropriate  Motor: Normal  Speech/Language:  Normal Rate  Affect: Appropriate  Mood: normal  Thought process: normal  Thought content:   WNL  Sensory/Perceptual disturbances:   WNL  Orientation: oriented to person, place, time/date, and situation  Attention: Good  Concentration: Good  Memory: WNL  Fund of knowledge:  Good  Insight:   Fair  Judgment:  Fair  Impulse Control: Good   Risk Assessment: Danger to Self:  No Self-injurious Behavior: No Danger to Others: No Duty to Warn:no Physical Aggression / Violence:No  Access to Firearms a concern: No  Gang Involvement:No   Subjective: Patient described himself as okay and disclosed that he is having to have a port place before he starts chemotherapy because oral chemotherapy is no longer working. He processed his thoughts and emotions regarding this development. Patient's biggest concern is fatigue. From there, he reflected on his experience in therapy. 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 He processed the therapeutic relationship and how it assisted him. He processed what he learned about himself in therapy. He processed thoughts and emotions. He denied suicidal and homicidal ideation.    Interventions: Worked on developing a therapeutic relationship with the patient using active listening and reflective statements. Provided emotional support using empathy and validation. Used  summary and reflective statements. Reviewed and processed the news regarding having to have a port placed. Normalized and validated expressed thoughts and emotions. Reflected on patient's therapy experience. Explored and processed what insights patient gained through the therapy experience. Processed how the therapeutic relationship assisted the patient. Identified the theme of music and processed how important that is to the patient. Reflected on specific examples from therapy that assisted the patient. Provided empathic statements. Assessed for suicidal and homicidal ideation.   Homework: NA  Next Session: NA. Patient will transition to new therapist as this clinician leaves the healthcare system.   Diagnosis: F33.1 major depressive affective disorder, recurrent, moderate and F41.1 generalized anxiety disorder  Plan:    Goals Alleviate depressive symptoms Recognize, accept, and cope with depressive feelings Develop healthy thinking patterns Develop healthy interpersonal relationships Reduce overall frequency, intensity, and duration of anxiety Stabilize anxiety level wile increasing ability to function Enhance ability to effectively cope with full variety of stressors Learn and implement coping skills that result in a reduction of anxiety   Objectives target date for all objectives is 04/08/2023 Verbalize an understanding of the cognitive, physiological, and behavioral components of anxiety Learning and implement calming skills to reduce overall anxiety Verbalize an understanding of the role that cognitive biases play in excessive irrational worry and persistent anxiety symptoms Identify, challenge, and replace based fearful talk Learn and implement problem solving strategies Identify and engage in pleasant activities Learning and implement personal and interpersonal skills to reduce anxiety and improve interpersonal relationships Learn to accept limitations in life and commit to  tolerating, rather than avoiding, unpleasant emotions while accomplishing meaningful goals Identify major life conflicts from the past  and present that form the basis for present anxiety Maintain involvement in work, family, and social activities Reestablish a consistent sleep-wake cycle Cooperate with a medical evaluation  Cooperate with a medication evaluation by a physician Verbalize an accurate understanding of depression Verbalize an understanding of the treatment Identify and replace thoughts that support depression Learn and implement behavioral strategies Verbalize an understanding and resolution of current interpersonal problems Learn and implement problem solving and decision making skills Learn and implement conflict resolution skills to resolve interpersonal problems Verbalize an understanding of healthy and unhealthy emotions verbalize insight into how past relationships may be influence current experiences with depression Use mindfulness and acceptance strategies and increase value based behavior  Increase hopeful statements about the future.  Interventions Engage the patient in behavioral activation Use instruction, modeling, and role-playing to build the client's general social, communication, and/or conflict resolution skills Use Acceptance and Commitment Therapy to help client accept uncomfortable realities in order to accomplish value-consistent goals Reinforce the client's insight into the role of his/her past emotional pain and present anxiety  Support the client in following through with work, family, and social activities Teach and implement sleep hygiene practices  Refer the patient to a physician for a psychotropic medication consultation Monito the clint's psychotropic medication compliance Discuss how anxiety typically involves excessive worry, various bodily expressions of tension, and avoidance of what is threatening that interact to maintain the problem  Teach  the patient relaxation skills Assign the patient homework Discuss examples demonstrating that unrealistic worry overestimates the probability of threats and underestimates patient's ability  Assist the patient in analyzing his or her worries Help patient understand that avoidance is reinforcing  Consistent with treatment model, discuss how change in cognitive, behavioral, and interpersonal can help client alleviate depression CBT Behavioral activation help the client explore the relationship, nature of the dispute,  Help the client develop new interpersonal skills and relationships Conduct Problem solving therapy Teach conflict resolution skills Use a process-experiential approach Conduct TLDP Conduct ACT Evaluate need for psychotropic medication Monitor adherence to medication   The patient and clinician reviewed the treatment plan on 04/26/2021. The patient and clinician reevaluated on 9.12.2023. The patient approved of the treatment.  Conception Chancy, PsyD

## 2022-11-09 ENCOUNTER — Inpatient Hospital Stay: Payer: Medicare Other | Attending: Hematology | Admitting: Dietician

## 2022-11-09 ENCOUNTER — Inpatient Hospital Stay: Payer: Medicare Other | Admitting: Licensed Clinical Social Worker

## 2022-11-09 DIAGNOSIS — R32 Unspecified urinary incontinence: Secondary | ICD-10-CM | POA: Insufficient documentation

## 2022-11-09 DIAGNOSIS — C7951 Secondary malignant neoplasm of bone: Secondary | ICD-10-CM | POA: Insufficient documentation

## 2022-11-09 DIAGNOSIS — Z452 Encounter for adjustment and management of vascular access device: Secondary | ICD-10-CM | POA: Insufficient documentation

## 2022-11-09 DIAGNOSIS — C61 Malignant neoplasm of prostate: Secondary | ICD-10-CM | POA: Insufficient documentation

## 2022-11-09 DIAGNOSIS — R110 Nausea: Secondary | ICD-10-CM | POA: Insufficient documentation

## 2022-11-09 DIAGNOSIS — R454 Irritability and anger: Secondary | ICD-10-CM | POA: Insufficient documentation

## 2022-11-09 DIAGNOSIS — R5383 Other fatigue: Secondary | ICD-10-CM | POA: Insufficient documentation

## 2022-11-09 DIAGNOSIS — F1099 Alcohol use, unspecified with unspecified alcohol-induced disorder: Secondary | ICD-10-CM | POA: Insufficient documentation

## 2022-11-09 DIAGNOSIS — Z5189 Encounter for other specified aftercare: Secondary | ICD-10-CM | POA: Insufficient documentation

## 2022-11-09 DIAGNOSIS — Z5111 Encounter for antineoplastic chemotherapy: Secondary | ICD-10-CM | POA: Insufficient documentation

## 2022-11-09 NOTE — Progress Notes (Signed)
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 Currently receiving Lupron q55m. He is planning to start palliative chemotherapy with cabazitaxel + prednisone (planned 4/11). Patient is under the care of Dr. Delton Coombes.   Past medical history includes anxiety, bilateral hydronephrosis, BPH, depression, HTN  Spoke with patient via telephone. Introduced self and services available at [facility]. Patient is appreciative of call. Patient endorses eating a healthy diet at baseline. 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 Patient has eliminated foods he has found to trigger diarrhea including cheese, milk, chocolate, beans, red cabbage. Patient recalls eating the same foods regularly. He is surprised at recent weight loss, reports 5 lbs in 2 weeks. For breakfast he has 7-8 grapes/banana high protein yogurt with blueberry and tree nuts and coffee. Patient recalls yellow squash, carrot, ginger root lightly boiled over red lentil pasta, avocado half, 3 slices of lean [place], pickle spear, beets, and an apple for dinner. He will occasionally snack on a few potato chips mid day. Sometimes will have a rice cake bedtime snack. Patient is concerned about feeling significant fatigue with chemotherapy.    Nutrition Focused Physical Exam: unable to complete - telephone visit    Medications: losartan, melatonin, ritalin, flomax   Labs: 3/5 reviewed    Anthropometrics:   Height: 6' Weight: 141 lb 3.2 oz UBW: 145-149 lb (2023) BMI: 18.92   NUTRITION DIAGNOSIS: Unintended weight loss related to metastatic cancer as evidenced by reported 5 lb wt decrease in 2 weeks    INTERVENTION:  Educated on increased calorie and protein energy needs to maintain weights/strength  Discussed adding high protein snacks in between meals and at bedtime  - suggestions provided  Contact information given  MONITORING, EVALUATION, GOAL: Patient will tolerate increased calories and protein energy intake to minimize further weight loss   Next Visit: Thursday April 25 via telephone

## 2022-11-13 ENCOUNTER — Encounter: Payer: Self-pay | Admitting: Hematology

## 2022-11-13 ENCOUNTER — Inpatient Hospital Stay: Payer: Medicare Other

## 2022-11-13 ENCOUNTER — Inpatient Hospital Stay: Payer: Medicare Other | Admitting: Licensed Clinical Social Worker

## 2022-11-13 DIAGNOSIS — Z95828 Presence of other vascular implants and grafts: Secondary | ICD-10-CM | POA: Insufficient documentation

## 2022-11-13 DIAGNOSIS — C61 Malignant neoplasm of prostate: Secondary | ICD-10-CM

## 2022-11-13 HISTORY — DX: Presence of other vascular implants and grafts: Z95.828

## 2022-11-13 MED ORDER — PROCHLORPERAZINE MALEATE 10 MG PO TABS: 10.0000 mg | ORAL_TABLET | Freq: Four times a day (QID) | ORAL | 1 refills | Status: DC | PRN

## 2022-11-13 MED ORDER — LIDOCAINE-PRILOCAINE 2.5-2.5 % EX CREA
TOPICAL_CREAM | CUTANEOUS | 3 refills | Status: DC
Start: 2022-11-13 — End: 2024-02-21

## 2022-11-13 MED ORDER — PREDNISONE 5 MG PO TABS: 5.0000 mg | ORAL_TABLET | Freq: Every day | ORAL | 4 refills | Status: DC

## 2022-11-13 NOTE — Progress Notes (Signed)
[facility] CSW Progress Note  Clinical Child psychotherapist  contacted pt by phone to check in on pt's support as he will be starting chemotherapy by infusion next week.  Pt has been undergoing treatment for prostate cancer for 10 years, but this will be his first chemotherapy.  CSW familiar with pt as he had contacted pt prior for counseling suggestions.  Pt has been seeing Dr Bosie Clos for several months which he has found to be extremely beneficial.  Pt is disappointed that Dr. Bosie Clos is leaving the practice, but confident that he will remain with an experienced therapist for continued support.  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  CSW ensured pt has contact information for CSW should any concerns arise and pt require additional support.  CSW to remain available throughout duration of treatment.        Rachel Moulds, LCSW

## 2022-11-13 NOTE — Patient Instructions (Signed)
[facility] Chemotherapy Teaching   You are diagnosed with metastatic (Stage IV) prostate cancer. We will treat you in the clinic every 3 weeks with a chemotherapy drug called Jevtana (cabazitaxel). You will also receive a white blood cell booster shot about 48 hours after each treatment. This is given to prevent your white blood cells (these blood cells help our bodies fight against infection, which the chemotherapy can cause to drop) from becoming too low. The intent of treatment is to control this disease, prevent it from spreading further, and to alleviate any symptoms you may be having related to this cancer. You will see the doctor regularly throughout treatment. We will obtain blood work from you prior to every treatment and monitor your results to make sure it is safe to give your treatment. The doctor monitors your response to treatment by the way you are feeling, your blood work, and by obtaining scans periodically. There will be wait times while you are here for treatment.  It will take about 30 minutes to 1 hour for your lab work to result.  Then there will be wait times while pharmacy mixes your medications.    Medications you will receive in the clinic prior to your chemotherapy medications:   Aloxi:  ALOXI is used in adults to help prevent the nausea and vomiting that happens with certain chemotherapy drugs.  Aloxi is a long acting medication, and will remain in your system for about 2 days.    Dexamethasone:  This is a steroid given prior to chemotherapy to help prevent allergic reactions; it may also help prevent and control nausea and diarrhea.    Pepcid:  This medication is a histamine blocker that helps prevent and allergic reaction to your chemotherapy.    Benadryl:  This is a histamine blocker (different from the Pepcid) that helps prevent allergic/infusion reactions to your chemotherapy. This medication may cause dizziness/drowsiness.    Cabazitaxel  (Jevtana)  Pronounced: [id number]  About: Cabazitaxel (Jevtana) Cabazitaxel (Jevtana) is given to treat certain types of cancer. It is given through the vein (IV). It will take 1 hour to infuse. The first infusion you receive will take longer (about 1.5 hours) because it is started at a very slow rate and increased every 15 minutes until the maximum rate is reached. This is to make sure you tolerate this medication without any adverse reactions. If you tolerate the first infusion without any issues, all subsequent infusions will be given over 1 hour.   *The blood levels of this medication can be affected by certain medications, so they should be avoided. These include: ketoconazole, itraconazole, posaconazole, voriconazole, clarithromycin, certain antiretroviral drugs used for HIV treatment, carbamazepine, phenytoin, phenobarbital, rifampin, and St. John's wort. Be sure to tell your healthcare provider about all medications and supplements you take.  Possible Side Effects There are a number of things you can do to manage the side effects of cabazitaxel. Talk to your care team about these recommendations. They can help you decide what will work best for you. These are some of the most common or important side effects:  Low White Blood Cell Count (Leukopenia or Neutropenia) White blood cells (WBC) are important for fighting infection. While receiving treatment, your WBC count can drop, putting you at a higher risk of getting an infection. You should let your doctor or nurse know right away if you have a fever (temperature greater than 100.4F or 38C), sore throat or cold, shortness of breath, cough, burning with urination,   or a sore that doesn't heal.  Neulasta (or similar) - this medication is not chemotherapy but is being given because you have had chemo. It is usually given 24-48 hours after the completion of chemotherapy. This medication works by boosting your bone marrow's supply of white  blood cells. White blood cells are what protect our bodies against infection. The medication is given in the form of a subcutaneous injection. It is given in the fatty tissue of your abdomen, or in the skin on the back of your arm . It is a short needle. The major side effect of this medication is bone or muscle pain. The drug of choice to relieve or lessen the pain is Aleve or Ibuprofen. If a physician has ever told you not to take Aleve or Ibuprofen - then don't take it. You should then take Tylenol/acetaminophen. Take either medication as the bottle directs you to.  The level of pain you experience as a result of this injection can range from none, to mild or moderate, or severe. Please let us know if you develop moderate or severe bone pain.   You can take Claritin 10 mg over the counter for a few days after receiving Neulasta to help with the bone aches and pains.      Tips to preventing infection:  - Washing hands, both yours and your visitors, is the best way to prevent the spread of infection.  - Avoid large crowds and people who are sick (i.e.: those who have a cold, fever or cough or live with someone with these symptoms).  - When working in your yard, wear protective clothing including long pants and gloves.  - Do not handle pet waste.  - Keep all cuts or scratches clean.  - Shower or bathe daily and perform frequent mouth care.  - Do not cut cuticles or ingrown nails. You may wear nail polish, but not fake nails.  - Ask your oncology care team before scheduling dental appointments or procedures.  - Ask your oncology care team before you, or someone you live with has any vaccinations.  Allergic Reactions In some cases, patients can have an allergic reaction to this medication. Signs of a reaction can include: shortness of breath or difficulty breathing, chest pain, rash, flushing or itching, chest or throat tightness, swelling of the face, or feeling dizzy or faint. If you notice  any changes in how you feel during the infusion, let your nurse know immediately.  Low Red Blood Cell Count (Anemia) Your red blood cells are responsible for carrying oxygen to the tissues in your body. When the red cell count is low, you may feel tired or weak. You should let your oncology care team know if you experience any shortness of breath, difficulty breathing, or pain in your chest. If the count gets too low, you may receive a blood transfusion.  Low Platelet Count (Thrombocytopenia) Platelets help your blood clot, so when the count is low you are at a higher risk of bleeding. Let your oncology care team know if you have any excess bruising or bleeding, including nose bleeds, bleeding gums or blood in your urine or stool. If the platelet count becomes too low, you may receive a transfusion of platelets.  - Do not use a razor (an electric razor is fine).  - Avoid contact sports and activities that can result in injury or bleeding.  - Do not take aspirin (salicylic acid), non-steroidal, anti-inflammatory medications (NSAIDs) such as Motrin/Advil (ibuprofen), Aleve (naproxen),   Celebrex (celecoxib) etc. as these can all increase the risk of bleeding. Please consult with your healthcare team regarding use of these agents and all over the counter medications/supplements while on therapy.  - Do not floss or use toothpicks and use a soft-bristle toothbrush to brush your teeth.  Diarrhea Your oncology care team can recommend medications to relieve diarrhea. Also, try eating low-fiber, bland foods, such as white rice and boiled or baked chicken. Avoid raw fruits, vegetables, whole grain breads, cereals and seeds. Soluble fiber is found in some foods and absorbs fluid, which can help relieve diarrhea. Foods high in soluble fiber include: applesauce, bananas (ripe), canned fruit, orange sections, boiled potatoes, white rice, products made with white flour, oatmeal, cream of rice, cream of wheat, and  farina. Drink 8-10 glasses of non-alcoholic, un-caffeinated fluid a day to prevent dehydration. Diarrhea can be a serious side effect that can lead to dehydration. Notify your healthcare provider if you develop diarrhea.  Nausea and/or Vomiting Talk to your oncology care team so they can prescribe medications to help you manage nausea and vomiting. In addition, dietary changes may help. Avoid things that may worsen the symptoms, such as heavy or greasy/fatty, spicy or acidic foods (lemons, tomatoes, oranges). Try saltines, or ginger ale to lessen symptoms.  Call your oncology care team if you are unable to keep fluids down for more than 12 hours or if you feel lightheaded or dizzy at any time.  Fatigue Fatigue is very common during cancer treatment and is an overwhelming feeling of exhaustion that is not usually relieved by rest. While on cancer treatment, and for a period after, you may need to adjust your schedule to manage fatigue. Plan times to rest during the day and conserve energy for more important activities. Exercise can help combat fatigue; a simple daily walk with a friend can help. Talk to your healthcare team for helpful tips on dealing with this side effect.  Constipation There are several things you can do to prevent or relieve constipation. Include fiber in your diet (fruits and vegetables), drink 8-10 glasses of non-alcoholic fluids a day, and keep active. A stool softener once or twice a day may prevent constipation. If you do not have a bowel movement for 2-3 days, you should contact your healthcare team for suggestions to relieve the constipation. This medication can cause a blockage in the bowel, so constipation should be taken seriously.  Less common, but important side effects can include:  Gastrointestinal (GI) Bleed & Tear: This medication can cause bleeding or a tear in the intestinal (bowel) wall. Signs of these problems include: unexpected bleeding, blood in the stool or  black stools, coughing up blood, vomiting blood, vomit that looks like coffee grounds, fever, severe pain in the abdomen, or new abdominal swelling. If you experience any of these, contact your healthcare provider immediately or go to the emergency room.  Kidney Problems: This medication can cause kidney failure. Your kidney function will be monitored throughout treatment. If you experience swelling of your face or body, a decrease in the amount of urine you are producing, or if your urine appears darker in color or if there is blood in it, notify your healthcare team immediately.  Lung Problems: This medication can cause respiratory disorders such as pneumonia, pneumonitis (inflammation), interstitial lung disease, and acute respiratory distress syndrome. If you develop any issues with breathing such as becoming short of breath, contact your care provider immediately.  Reproductive Concerns Exposure of an unborn child   to this medication could cause birth defects, so you should not father a child while on this medication. Effective birth control is necessary during treatment and for 3 months after your last dose.  SELF CARE ACTIVITIES WHILE ON CHEMOTHERAPY/IMMUNOTHERAPY:  Hydration Increase your fluid intake and drink at least 64 ounces (2 liters) of water/decaffeinated beverages per day after treatment. You can still have your cup of coffee or soda but these beverages do not count as part of the 64 ounces that you need to drink daily. Limit alcohol intake.  Medications Continue taking your normal prescription medication as prescribed.  If you start any new herbal or new supplements please let us know first to make sure it is safe.  Mouth Care Have teeth cleaned professionally before starting treatment. Keep dentures and partial plates clean. Use soft toothbrush and do not use mouthwashes that contain alcohol. Biotene is a good mouthwash that is available at most pharmacies or may be ordered by  calling [phone]. Use warm salt water gargles (1 teaspoon salt per 1 quart warm water) before and after meals and at bedtime. If you are still having problems with your mouth or sores in your mouth please call the clinic. If you need dental work, please let the doctor know before you go for your appointment so that we can coordinate the best possible time for you in regards to your chemo regimen. You need to also let your dentist know that you are actively taking chemo. We may need to do labs prior to your dental appointment.  Skin Care Always use sunscreen that has not expired and with SPF (Sun Protection Factor) of 50 or higher. Wear hats to protect your head from the sun. Remember to use sunscreen on your hands, ears, face, & feet.  Use good moisturizing lotions such as udder cream, eucerin, or even Vaseline. Some chemotherapies can cause dry skin, color changes in your skin and nails.    Avoid long, hot showers or baths. Use gentle, fragrance-free soaps and laundry detergent. Use moisturizers, preferably creams or ointments rather than lotions because the thicker consistency is better at preventing skin dehydration. Apply the cream or ointment within 15 minutes of showering. Reapply moisturizer at night, and moisturize your hands every time after you wash them.   Infection Prevention Please wash your hands for at least 30 seconds using warm soapy water. Handwashing is the #1 way to prevent the spread of germs. Stay away from sick people or people who are getting over a cold. If you develop respiratory systems such as green/yellow mucus production or productive cough or persistent cough let us know and we will see if you need an antibiotic. It is a good idea to keep a pair of gloves on when going into grocery stores/[employer] to decrease your risk of coming into contact with germs on the carts, etc. Carry alcohol hand gel with you at all times and use it frequently if out in public. If your  temperature reaches 100.5 or higher please call the clinic and let us know.  If it is after hours or on the weekend please go to the ER if your temperature is over 100.4.  Please have your own personal thermometer at home to use.    Sex and bodily fluids If you are going to have sex, a condom must be used to protect the person that isn't taking immunotherapy. For a few days after treatment, immunotherapy can be excreted through your bodily fluids.  When using   the toilet please close the lid and flush the toilet twice.  Do this for a few day after you have had immunotherapy.   Contraception It is not known for sure whether or not immunotherapy drugs can be passed on through semen or secretions from the vagina. Because of this some doctors advise people to use a barrier method if you have sex during treatment. This applies to vaginal, anal or oral sex.  Generally, doctors advise a barrier method only for the time you are actually having the treatment and for about a week after your treatment.  Advice like this can be worrying, but this does not mean that you have to avoid being intimate with your partner. You can still have close contact with your partner and continue to enjoy sex.  Animals If you have cats or birds we ask that you not change the litter or change the cage.  Please have someone else do this for you while you are on immunotherapy.   Food Safety During and After Cancer Treatment Food safety is important for people both during and after cancer treatment. Cancer and cancer treatments, such as chemotherapy, radiation therapy, and stem cell/bone marrow transplantation, often weaken the immune system. This makes it harder for your body to protect itself from foodborne illness, also called food poisoning. Foodborne illness is caused by eating food that contains harmful bacteria, parasites, or viruses.  Foods to avoid Some foods have a higher risk of becoming tainted with bacteria. These  include: Unwashed fresh fruit and vegetables, especially leafy vegetables that can hide dirt and other contaminants Raw sprouts, such as alfalfa sprouts Raw or undercooked beef, especially ground beef, or other raw or undercooked meat and poultry Fatty, fried, or spicy foods immediately before or after treatment.  These can sit heavy on your stomach and make you feel nauseous. Raw or undercooked shellfish, such as oysters. Sushi and sashimi, which often contain raw fish.  Unpasteurized beverages, such as unpasteurized fruit juices, raw milk, raw yogurt, or cider Undercooked eggs, such as soft boiled, over easy, and poached; raw, unpasteurized eggs; or foods made with raw egg, such as homemade raw cookie dough and homemade mayonnaise  Simple steps for food safety  Shop smart. Do not buy food stored or displayed in an unclean area. Do not buy bruised or damaged fruits or vegetables. Do not buy cans that have cracks, dents, or bulges. Pick up foods that can spoil at the end of your shopping trip and store them in a cooler on the way home.  Prepare and clean up foods carefully. Rinse all fresh fruits and vegetables under running water, and dry them with a clean towel or paper towel. Clean the top of cans before opening them. After preparing food, wash your hands for 20 seconds with hot water and soap. Pay special attention to areas between fingers and under nails. Clean your utensils and dishes with hot water and soap. Disinfect your kitchen and cutting boards using 1 teaspoon of liquid, unscented bleach mixed into 1 quart of water.    Dispose of old food. Eat canned and packaged food before its expiration date (the "use by" or "best before" date). Consume refrigerated leftovers within 3 to 4 days. After that time, throw out the food. Even if the food does not smell or look spoiled, it still may be unsafe. Some bacteria, such as Listeria, can grow even on foods stored in the refrigerator if  they are kept for too long.  Take   precautions when eating out. At restaurants, avoid buffets and salad bars where food sits out for a long time and comes in contact with many people. Food can become contaminated when someone with a virus, often a norovirus, or another "bug" handles it. Put any leftover food in a "to-go" container yourself, rather than having the server do it. And, refrigerate leftovers as soon as you get home. Choose restaurants that are clean and that are willing to prepare your food as you order it cooked.    SYMPTOMS TO REPORT AS SOON AS POSSIBLE AFTER TREATMENT:  FEVER GREATER THAN 100.4 F CHILLS WITH OR WITHOUT FEVER NAUSEA AND VOMITING THAT IS NOT CONTROLLED WITH YOUR NAUSEA MEDICATION UNUSUAL SHORTNESS OF BREATH UNUSUAL BRUISING OR BLEEDING TENDERNESS IN MOUTH AND THROAT WITH OR WITHOUT PRESENCE OF ULCERS URINARY PROBLEMS BOWEL PROBLEMS UNUSUAL RASH     Wear comfortable clothing and clothing appropriate for easy access to any Portacath or PICC line. Let us know if there is anything that we can do to make your therapy better!   What to do if you need assistance after hours or on the weekends: CALL [phone].  HOLD on the line, do not hang up.  You will hear multiple messages but at the end you will be connected with a nurse triage line.  They will contact the doctor if necessary.  Most of the time they will be able to assist you.  Do not call the hospital operator.    I have been informed and understand all of the instructions given to me and have received a copy. I have been instructed to call the clinic [phone] or my family physician as soon as possible for continued medical care, if indicated. I do not have any more questions at this time but understand that I may call the Cancer Center or the Patient Navigator at [phone] during office hours should I have questions or need assistance in obtaining follow-up care.   

## 2022-11-13 NOTE — Progress Notes (Signed)

## 2022-11-14 ENCOUNTER — Encounter: Payer: Self-pay | Admitting: *Deleted

## 2022-11-14 ENCOUNTER — Other Ambulatory Visit: Payer: Self-pay

## 2022-11-15 ENCOUNTER — Other Ambulatory Visit: Payer: Self-pay | Admitting: Student

## 2022-11-15 ENCOUNTER — Other Ambulatory Visit: Payer: Self-pay

## 2022-11-15 ENCOUNTER — Other Ambulatory Visit: Payer: Self-pay | Admitting: Radiology

## 2022-11-15 DIAGNOSIS — C61 Malignant neoplasm of prostate: Secondary | ICD-10-CM

## 2022-11-16 ENCOUNTER — Ambulatory Visit (HOSPITAL_COMMUNITY)
Admission: RE | Admit: 2022-11-16 | Discharge: 2022-11-16 | Disposition: A | Discharge status: Home / Self Care | Payer: Medicare Other | Source: Ambulatory Visit | Attending: Hematology | Admitting: Hematology

## 2022-11-16 ENCOUNTER — Other Ambulatory Visit: Payer: Self-pay

## 2022-11-16 DIAGNOSIS — C61 Malignant neoplasm of prostate: Secondary | ICD-10-CM | POA: Insufficient documentation

## 2022-11-16 DIAGNOSIS — F32A Depression, unspecified: Secondary | ICD-10-CM | POA: Diagnosis not present

## 2022-11-16 DIAGNOSIS — F419 Anxiety disorder, unspecified: Secondary | ICD-10-CM | POA: Diagnosis not present

## 2022-11-16 DIAGNOSIS — C7951 Secondary malignant neoplasm of bone: Secondary | ICD-10-CM | POA: Diagnosis not present

## 2022-11-16 DIAGNOSIS — I10 Essential (primary) hypertension: Secondary | ICD-10-CM | POA: Insufficient documentation

## 2022-11-16 HISTORY — PX: IR IMAGING GUIDED PORT INSERTION: IMG5740

## 2022-11-16 MED ORDER — HEPARIN SOD (PORK) LOCK FLUSH 100 UNIT/ML IV SOLN
INTRAVENOUS | Status: AC
  Filled 2022-11-16: qty 5

## 2022-11-16 MED ORDER — HEPARIN SOD (PORK) LOCK FLUSH 100 UNIT/ML IV SOLN: 500.0000 [IU] | Freq: Once | INTRAVENOUS | Status: DC

## 2022-11-16 MED ORDER — MIDAZOLAM HCL 2 MG/2ML IJ SOLN
INTRAMUSCULAR | Status: AC | PRN
  Administered 2022-11-16 (×3): 1 mg via INTRAVENOUS

## 2022-11-16 MED ORDER — LIDOCAINE-EPINEPHRINE 1 %-1:100000 IJ SOLN
INTRAMUSCULAR | Status: AC
  Filled 2022-11-16: qty 1

## 2022-11-16 MED ORDER — LIDOCAINE-EPINEPHRINE 1 %-1:100000 IJ SOLN
20.0000 mL | Freq: Once | INTRAMUSCULAR | Status: AC
  Administered 2022-11-16: 20 mL via INTRADERMAL

## 2022-11-16 MED ORDER — SODIUM CHLORIDE 0.9 % IV SOLN: INTRAVENOUS | Status: DC

## 2022-11-16 MED ORDER — MIDAZOLAM HCL 2 MG/2ML IJ SOLN
INTRAMUSCULAR | Status: AC
  Filled 2022-11-16: qty 2

## 2022-11-16 NOTE — H&P (Signed)
Chief Complaint: Patient was seen in consultation today for prostate cancer  Referring Physician(s): Katragadda,Sreedhar  Supervising Physician: Roanna Banning  Patient Status: [facility] - Out-pt  History of Present Illness: Nathan Smith is a 72 y.o. male with PMH significant for anxiety, depression, and hypertension being seen today in relation to prostate cancer. Patient was initially diagnosed with metastatic prostate cancer in 2014 and has been followed by Dr Ellin Saba. He has been found to have worsening PSA and PET scan from 10/26/22 showed metastasis to T11 vertebrae. Patient will be starting chemotherapy under Dr Ellin Saba and has been referred to IR for image-guided port placement.  Past Medical History:  Diagnosis Date   Anxiety    Bilateral hydronephrosis    Bladder incontinence    night time,  past 2 months   BPH (benign prostatic hypertrophy) with urinary obstruction    Depression    ED (erectile dysfunction)    H/O ascites    trace   Hypertension    Port-A-Cath in place 11/13/2022   Prostate cancer 09/24/2012   gleason 4+3=7., & 4+4=8,PSA=67.30, volume=33.7cc    Past Surgical History:  Procedure Laterality Date   PROSTATE BIOPSY  09/24/12   Adenocarcinoma   PROSTATE BIOPSY N/A 12/20/2017   Procedure: BIOPSY TRANSRECTAL ULTRASONIC PROSTATE (TUBP);  Surgeon: Marcine Matar, MD;  Location: [facility];  Service: Urology;  Laterality: N/A;   TONSILLECTOMY      Allergies: Patient has no known allergies.  Medications: Prior to Admission medications   Medication Sig Start Date End Date Taking? Authorizing Provider  Ascorbic Acid (VITAMIN C) 1000 MG tablet Take 1,000 mg by mouth daily.    [provider]  b complex vitamins capsule Take 1 capsule by mouth daily.    [provider]  Bacillus Coagulans-Inulin (PROBIOTIC) 1-250 BILLION-MG CAPS Take by mouth daily.     [provider]  Cabazitaxel (JEVTANA IV) Inject into the vein every 21 (  twenty-one) days. 11/21/22   [provider]  Cholecalciferol 125 MCG (5000 UT) capsule Take 5,000 Units by mouth daily.     [provider]  Ginseng 100 MG CAPS Take by mouth daily.     [provider]  L-ARGININE PO Take 1,000 mg by mouth daily.     [provider]  lidocaine-prilocaine (EMLA) cream Apply a small amount to port a cath site and cover with plastic wrap one hour prior to infusion appointments 11/13/22   Doreatha Massed, MD  losartan (COZAAR) 25 MG tablet TAKE 1 TABLET(25 MG) BY MOUTH DAILY 02/14/22   Doreatha Massed, MD  melatonin 1 MG TABS tablet Take 2 mg by mouth at bedtime.     [provider]  methylphenidate (RITALIN) 5 MG tablet Take 0.5 tablets (2.5 mg total) by mouth daily as needed. 07/12/21   Doreatha Massed, MD  predniSONE (DELTASONE) 5 MG tablet Take 1 tablet (5 mg total) by mouth daily with breakfast. 11/13/22   Doreatha Massed, MD  Probiotic Product (ALIGN) 4 MG CAPS Take 1 capsule by mouth daily.     [provider]  prochlorperazine (COMPAZINE) 10 MG tablet Take 1 tablet (10 mg total) by mouth every 6 (six) hours as needed for nausea or vomiting. 11/13/22   Doreatha Massed, MD  tamsulosin (FLOMAX) 0.4 MG CAPS capsule TAKE 1 CAPSULE(0.4 MG) BY MOUTH DAILY 02/14/22   Doreatha Massed, MD  zinc gluconate 50 MG tablet Take 50 mg by mouth daily.    [provider]  Family History  Problem Relation Age of Onset   Depression Mother    Dementia Mother    Anxiety disorder Mother    Prostate cancer Father        unconfirmed diagnosis    Social History   Socioeconomic History   Marital status: Single    Spouse name: Not on file   Number of children: 0   Years of education: Not on file   Highest education level: Not on file  Occupational History   Occupation: [name]    Employer: [employer]  Tobacco Use   Smoking status: Never   Smokeless tobacco: Never   Vaping Use   Vaping Use: Never used  Substance and Sexual Activity   Alcohol use: Yes    Alcohol/week: 2.0 standard drinks of alcohol    Types: 2 Cans of beer per week    Comment: 3 per day, beer/wine , hx etoh abuse years ago   Drug use: No    Comment: hx   Sexual activity: Yes    Birth control/protection: Condom  Other Topics Concern   Not on file  Social History Narrative   Not on file   Social Determinants of Health   Financial Resource Strain: Low Risk  (12/08/2019)   Overall Financial Resource Strain (CARDIA)    Difficulty of Paying Living Expenses: Not very hard  Food Insecurity: No Food Insecurity (12/08/2019)   Hunger Vital Sign    Worried About Running Out of Food in the Last Year: Never true    Ran Out of Food in the Last Year: Never true  Transportation Needs: No Transportation Needs (12/08/2019)   PRAPARE - Administrator, Civil Service (Medical): No    Lack of Transportation (Non-Medical): No  Physical Activity: Sufficiently Active (12/08/2019)   Exercise Vital Sign    Days of Exercise per Week: 6 days    Minutes of Exercise per Session: 60 min  Stress: Stress Concern Present (12/08/2019)   [employer] of Occupational Health - Occupational Stress Questionnaire    Feeling of Stress : To some extent  Social Connections: Unknown (12/08/2019)   Social Connection and Isolation Panel [NHANES]    Frequency of Communication with Friends and Family: Twice a week    Frequency of Social Gatherings with Friends and Family: Once a week    Attends Religious Services: More than 4 times per year    Active Member of [employer] or Organizations: No    Attends Engineer, structural: More than 4 times per year    Marital Status: Not on file    Code Status: Full Code  Review of Systems: A 12 point ROS discussed and pertinent positives are indicated in the HPI above.  All other systems are negative.  Review of Systems  Constitutional:  Negative for chills and  fever.  Respiratory:  Negative for chest tightness and shortness of breath.   Cardiovascular:  Negative for chest pain and leg swelling.  Gastrointestinal:  Positive for diarrhea. Negative for abdominal pain, nausea and vomiting.  Neurological:  Negative for dizziness and headaches.  Psychiatric/Behavioral:  Negative for confusion.     Vital Signs: BP (!) 157/93 (BP Location: Right Arm)   Pulse 69   Temp 98.2 F (36.8 C) (Oral)   Resp 16   Ht 6\' 1"  (1.854 m)   Wt 144 lb (65.3 kg)   SpO2 100%   BMI 19.00 kg/m      Physical Exam Vitals reviewed.  Constitutional:      General: He is not in acute distress.    Appearance: He is ill-appearing.  HENT:     Mouth/Throat:     Mouth: Mucous membranes are moist.  Cardiovascular:     Rate and Rhythm: Normal rate and regular rhythm.     Pulses: Normal pulses.     Heart sounds: Normal heart sounds.  Pulmonary:     Effort: Pulmonary effort is normal.     Breath sounds: Normal breath sounds.  Abdominal:     Palpations: Abdomen is soft.     Tenderness: There is abdominal tenderness.  Musculoskeletal:     Right lower leg: No edema.     Left lower leg: No edema.  Skin:    General: Skin is warm and dry.  Neurological:     Mental Status: He is alert and oriented to person, place, and time.  Psychiatric:        Mood and Affect: Mood normal.        Behavior: Behavior normal.        Thought Content: Thought content normal.        Judgment: Judgment normal.     Imaging: NM PET (PSMA) SKULL TO MID THIGH  Result Date: 10/29/2022 CLINICAL DATA:  Prostate carcinoma restaging. EXAM: NUCLEAR MEDICINE PET SKULL BASE TO THIGH TECHNIQUE: 9.35 mCi F18 Piflufolastat (Pylarify) was injected intravenously. Full-ring PET imaging was performed from the skull base to thigh after the radiotracer. CT data was obtained and used for attenuation correction and anatomic localization. COMPARISON:  Multiple priors including most recent Fluciclovine PET-CT  Dec 23, 2019 FINDINGS: NECK No radiotracer activity in neck lymph nodes. Incidental CT finding: None. CHEST No radiotracer accumulation within mediastinal or hilar lymph nodes. No radiotracer avid pulmonary nodules or masses. Incidental CT finding: Aortic atherosclerosis. Coronary artery calcifications. Minimal biapical pleuroparenchymal scarring. Hypoventilatory change in the lung bases. Motion degraded examination of the lungs reveals no suspicious pulmonary nodules or masses. ABDOMEN/PELVIS Prostate: Nodular uptake along the right side of the prostate adjacent to a brachytherapy seeds with a max SUV of 6.9. Lymph nodes: Minimally radiotracer avid left periaortic lymph node measures 5 mm in short axis on image 153/3 with a max SUV of 4.1. Liver: No evidence of liver metastasis. Incidental CT finding: Symmetric wall thickening of the urinary bladder with bladder wall diverticula and trabeculations. Large right inguinal hernia contains fat and nonobstructed bowel. Aortic atherosclerosis. SKELETON Radiotracer avid mixed lytic and sclerotic lesion in the T11 vertebral body measures 2.7 x 2.6 cm on image 207/3 with a max SUV of 94.9. IMPRESSION: 1. Radiotracer avid mixed lytic and sclerotic lesion in the T11 vertebral body consistent with osseous metastatic disease. 2. Nodular uptake along the right side of the prostate adjacent to a brachytherapy seeds is compatible with local recurrence. 3. Minimally radiotracer avid left periaortic lymph node measures 5 mm, suspicious for nodal metastatic disease. 4. No evidence of liver metastasis. 5. Large right inguinal hernia contains fat and nonobstructed bowel. 6. Symmetric wall thickening of the urinary bladder with bladder wall diverticula and trabeculations, consistent with chronic bladder outlet obstruction. 7.  Aortic Atherosclerosis (ICD10-I70.0). Electronically Signed   By: Maudry Mayhew M.D.   On: 10/29/2022 09:58    Labs:  CBC: Recent Labs    01/16/22 1338  05/09/22 1413 08/15/22 1541 10/10/22 1433  WBC 4.2 4.1 5.2 4.9  HGB 15.5 14.1 14.6 14.9  HCT 47.4 42.4 44.2 45.2  PLT 264 250 284 251  COAGS: No results for input(s): "INR", "APTT" in the last 8760 hours.  BMP: Recent Labs    01/16/22 1338 05/09/22 1413 08/15/22 1541 10/10/22 1433  NA 139 140 136 136  K 4.0 4.3 4.1 4.1  CL 105 103 100 100  CO2 29 29 27 28   GLUCOSE 98 100* 100* 96  BUN 12 13 15 13   CALCIUM 9.3 9.2 9.3 9.1  CREATININE 1.03 0.95 0.92 0.90  GFRNONAA >60 >60 >60 >60    LIVER FUNCTION TESTS: Recent Labs    01/16/22 1338 05/09/22 1413 08/15/22 1541 10/10/22 1433  BILITOT 0.9 1.0 0.7 0.7  AST 25 22 23 20   ALT 15 14 13 12   ALKPHOS 53 48 53 54  PROT 7.1 6.7 7.0 6.9  ALBUMIN 4.2 3.9 4.1 3.9    TUMOR MARKERS: No results for input(s): "AFPTM", "CEA", "CA199", "CHROMGRNA" in the last 8760 hours.  Assessment and Plan:  Nathan Smith is a 72 yo male being seen today in relation to prostate cancer requiring chemotherapy. Image-guided port placement has been requested. Patient is not NPO, so procedure will not be done under moderate sedation, patient is in agreement with this. Case has been reviewed with Dr Milford CageMugweru and is scheduled to proceed on 11/16/22.  Risks and benefits of image guided port-a-catheter placement was discussed with the patient including, but not limited to bleeding, infection, pneumothorax, or fibrin sheath development and need for additional procedures.  All of the patient's questions were answered, patient is agreeable to proceed. Consent signed and in chart.   Thank you for this interesting consult.  I greatly enjoyed meeting Nathan Smith and look forward to participating in their care.  A copy of this report was sent to the requesting provider on this date.  Electronically Signed: Kennieth FrancoisMatthew L Anoushka Divito, PA-C 11/16/2022, 1:27 PM   I spent a total of  40 Minutes   in face to face in clinical consultation, greater than 50% of which  was counseling/coordinating care for prostate cancer.

## 2022-11-16 NOTE — Progress Notes (Signed)
Patient was given discharge instructions. He verbalized understanding. 

## 2022-11-16 NOTE — Progress Notes (Signed)
Report from vivian rn

## 2022-11-16 NOTE — Procedures (Signed)
Vascular and Interventional Radiology Procedure Note  Patient: Nathan Smith DOB: 03-02-1951 Medical Record Number: [id number] Note Date/Time: 11/16/22 2:59 PM   Performing Physician: Roanna Banning, MD Assistant(s): None  Diagnosis:  Prostate CA  Procedure: PORT PLACEMENT  Anesthesia: Conscious Sedation Complications: None Estimated Blood Loss: Minimal  Findings:  Successful right-sided port placement, with the tip of the catheter in the superior cavoatrial junction.  Plan: Catheter ready for use.  See detailed procedure note with images in PACS. The patient tolerated the procedure well without incident or complication and was returned to Recovery in stable condition.    Roanna Banning, MD [facility]   Pager. [phone] Clinic. [phone]

## 2022-11-20 NOTE — Progress Notes (Signed)
The following biosimilar Stimufend (pegfilgrastim-fpgk) has been selected for use in this patient.   Melvern Ramone, PharmD 

## 2022-11-20 NOTE — Progress Notes (Signed)
Pharmacist Chemotherapy Monitoring - Initial Assessment    Anticipated start date: 11/21/22   The following has been reviewed per standard work regarding the patient's treatment regimen: The patient's diagnosis, treatment plan and drug doses, and organ/hematologic function Lab orders and baseline tests specific to treatment regimen  The treatment plan start date, drug sequencing, and pre-medications Prior authorization status  Patient's documented medication list, including drug-drug interaction screen and prescriptions for anti-emetics and supportive care specific to the treatment regimen The drug concentrations, fluid compatibility, administration routes, and timing of the medications to be used The patient's access for treatment and lifetime cumulative dose history, if applicable  The patient's medication allergies and previous infusion related reactions, if applicable   Changes made to treatment plan:  N/A  Follow up needed:  N/A   Stephens Shire, [facility], 11/20/2022  12:06 PM

## 2022-11-21 ENCOUNTER — Inpatient Hospital Stay: Payer: Medicare Other

## 2022-11-21 ENCOUNTER — Ambulatory Visit: Payer: Medicare Other | Admitting: Hematology

## 2022-11-21 ENCOUNTER — Ambulatory Visit: Payer: Medicare Other

## 2022-11-21 ENCOUNTER — Other Ambulatory Visit: Payer: Medicare Other

## 2022-11-21 VITALS — BP 146/85 | HR 56 | Temp 98.1°F | Resp 18

## 2022-11-21 VITALS — BP 153/87 | HR 67 | Temp 98.3°F | Resp 17 | Ht 72.0 in | Wt 143.2 lb

## 2022-11-21 DIAGNOSIS — Z95828 Presence of other vascular implants and grafts: Secondary | ICD-10-CM

## 2022-11-21 DIAGNOSIS — C7951 Secondary malignant neoplasm of bone: Secondary | ICD-10-CM | POA: Diagnosis present

## 2022-11-21 DIAGNOSIS — Z5189 Encounter for other specified aftercare: Secondary | ICD-10-CM | POA: Diagnosis not present

## 2022-11-21 DIAGNOSIS — F1099 Alcohol use, unspecified with unspecified alcohol-induced disorder: Secondary | ICD-10-CM | POA: Diagnosis not present

## 2022-11-21 DIAGNOSIS — C61 Malignant neoplasm of prostate: Secondary | ICD-10-CM

## 2022-11-21 DIAGNOSIS — R32 Unspecified urinary incontinence: Secondary | ICD-10-CM | POA: Diagnosis not present

## 2022-11-21 DIAGNOSIS — R5383 Other fatigue: Secondary | ICD-10-CM | POA: Diagnosis not present

## 2022-11-21 DIAGNOSIS — R110 Nausea: Secondary | ICD-10-CM | POA: Diagnosis not present

## 2022-11-21 DIAGNOSIS — R454 Irritability and anger: Secondary | ICD-10-CM | POA: Diagnosis not present

## 2022-11-21 DIAGNOSIS — Z5111 Encounter for antineoplastic chemotherapy: Secondary | ICD-10-CM | POA: Diagnosis present

## 2022-11-21 DIAGNOSIS — Z452 Encounter for adjustment and management of vascular access device: Secondary | ICD-10-CM | POA: Diagnosis not present

## 2022-11-21 LAB — COMPREHENSIVE METABOLIC PANEL
ALT: 13 U/L (ref 0–44)
AST: 20 U/L (ref 15–41)
Albumin: 3.9 g/dL (ref 3.5–5.0)
Alkaline Phosphatase: 64 U/L (ref 38–126)
Anion gap: 4 — ABNORMAL LOW (ref 5–15)
BUN: 14 mg/dL (ref 8–23)
CO2: 29 mmol/L (ref 22–32)
Calcium: 8.8 mg/dL — ABNORMAL LOW (ref 8.9–10.3)
Chloride: 102 mmol/L (ref 98–111)
Creatinine, Ser: 0.94 mg/dL (ref 0.61–1.24)
GFR, Estimated: >60 mL/min (ref >60)
Glucose, Bld: 65 mg/dL — ABNORMAL LOW (ref 70–99)
Potassium: 4.2 mmol/L (ref 3.5–5.1)
Sodium: 135 mmol/L (ref 135–145)
Total Bilirubin: 0.4 mg/dL (ref 0.3–1.2)
Total Protein: 6.7 g/dL (ref 6.5–8.1)

## 2022-11-21 LAB — CBC WITH DIFFERENTIAL/PLATELET
Abs Immature Granulocytes: 0.02 10*3/uL (ref 0.00–0.07)
Basophils Absolute: 0 10*3/uL (ref 0.0–0.1)
Basophils Relative: 1 %
Eosinophils Absolute: 0.1 10*3/uL (ref 0.0–0.5)
Eosinophils Relative: 2 %
HCT: 43.5 % (ref 39.0–52.0)
Hemoglobin: 14.3 g/dL (ref 13.0–17.0)
Immature Granulocytes: 0 %
Lymphocytes Relative: 12 %
Lymphs Abs: 0.6 10*3/uL — ABNORMAL LOW (ref 0.7–4.0)
MCH: 33.4 pg (ref 26.0–34.0)
MCHC: 32.9 g/dL (ref 30.0–36.0)
MCV: 101.6 fL — ABNORMAL HIGH (ref 80.0–100.0)
Monocytes Absolute: 0.4 10*3/uL (ref 0.1–1.0)
Monocytes Relative: 7 %
Neutro Abs: 3.9 10*3/uL (ref 1.7–7.7)
Neutrophils Relative %: 78 %
Platelets: 240 10*3/uL (ref 150–400)
RBC: 4.28 MIL/uL (ref 4.22–5.81)
RDW: 13.8 % (ref 11.5–15.5)
WBC: 5 10*3/uL (ref 4.0–10.5)
nRBC: 0 % (ref 0.0–0.2)

## 2022-11-21 LAB — MAGNESIUM: Magnesium: 2.1 mg/dL (ref 1.7–2.4)

## 2022-11-21 MED ORDER — PALONOSETRON HCL INJECTION 0.25 MG/5ML
0.2500 mg | Freq: Once | INTRAVENOUS | Status: AC
  Administered 2022-11-21: 0.25 mg via INTRAVENOUS
  Filled 2022-11-21: qty 5

## 2022-11-21 MED ORDER — DIPHENHYDRAMINE HCL 50 MG/ML IJ SOLN: 25.0000 mg | Freq: Once | INTRAMUSCULAR | Status: DC

## 2022-11-21 MED ORDER — ALTEPLASE 2 MG IJ SOLR
2.0000 mg | Freq: Once | INTRAMUSCULAR | Status: AC
  Administered 2022-11-21: 2 mg
  Filled 2022-11-21: qty 2

## 2022-11-21 MED ORDER — FAMOTIDINE IN NACL 20-0.9 MG/50ML-% IV SOLN
20.0000 mg | Freq: Once | INTRAVENOUS | Status: AC
  Administered 2022-11-21: 20 mg via INTRAVENOUS
  Filled 2022-11-21: qty 50

## 2022-11-21 MED ORDER — SODIUM CHLORIDE 0.9 % IV SOLN
10.0000 mg | Freq: Once | INTRAVENOUS | Status: AC
  Administered 2022-11-21: 10 mg via INTRAVENOUS
  Filled 2022-11-21: qty 1

## 2022-11-21 MED ORDER — SODIUM CHLORIDE 0.9% FLUSH
10.0000 mL | Freq: Once | INTRAVENOUS | Status: AC
  Administered 2022-11-21: 10 mL via INTRAVENOUS

## 2022-11-21 MED ORDER — SODIUM CHLORIDE 0.9 % IV SOLN: Freq: Once | INTRAVENOUS | Status: AC

## 2022-11-21 MED ORDER — SODIUM CHLORIDE 0.9 % IV SOLN
15.0000 mg/m2 | Freq: Once | INTRAVENOUS | Status: AC
  Administered 2022-11-21: 27 mg via INTRAVENOUS
  Filled 2022-11-21: qty 2.7

## 2022-11-21 MED ORDER — HEPARIN SOD (PORK) LOCK FLUSH 100 UNIT/ML IV SOLN
500.0000 [IU] | Freq: Once | INTRAVENOUS | Status: AC | PRN
  Administered 2022-11-21: 500 [IU]

## 2022-11-21 MED ORDER — SODIUM CHLORIDE 0.9% FLUSH
10.0000 mL | INTRAVENOUS | Status: DC | PRN
  Administered 2022-11-21: 10 mL

## 2022-11-21 MED ORDER — CETIRIZINE HCL 10 MG/ML IV SOLN
10.0000 mg | Freq: Once | INTRAVENOUS | Status: AC
  Administered 2022-11-21: 10 mg via INTRAVENOUS
  Filled 2022-11-21: qty 1

## 2022-11-21 NOTE — Progress Notes (Signed)
Patient presents today for C1D1 Jevtana infusion.  Patient is in satisfactory condition with no new complaints voiced.  Vital signs are stable.  Labs reviewed and all labs are within treatment parameters.  We will proceed with treatment per MD orders.    C1D1 Jevtana given today per MD orders. Tolerated infusion without adverse affects. Vital signs stable. No complaints at this time. Discharged from clinic ambulatory in stable condition. Alert and oriented x 3. F/U with [facility] as scheduled.

## 2022-11-21 NOTE — Progress Notes (Signed)
Discontinue diphenhydramine from oncology treatment plan --> Add Quzyttir (cetirizine) 10 mg IVPush x 1 as premedication for oncology treatment plan.  T.O. Dr Katragadda/Miranda Garber, PharmD  

## 2022-11-21 NOTE — Patient Instructions (Signed)
[facility]  Discharge Instructions: Thank you for choosing [facility] to provide your oncology and hematology care.  If you have a lab appointment with the Cancer Center - please note that after April 8th, 2024, all labs will be drawn in the cancer center.  You do not have to check in or register with the main entrance as you have in the past but will complete your check-in in the cancer center.  Wear comfortable clothing and clothing appropriate for easy access to any Portacath or PICC line.   We strive to give you quality time with your provider. You may need to reschedule your appointment if you arrive late (15 or more minutes).  Arriving late affects you and other patients whose appointments are after yours.  Also, if you miss three or more appointments without notifying the office, you may be dismissed from the clinic at the provider's discretion.      For prescription refill requests, have your pharmacy contact our office and allow 72 hours for refills to be completed.    Today you received the following chemotherapy and/or immunotherapy agents Jevtana   To help prevent nausea and vomiting after your treatment, we encourage you to take your nausea medication as directed.  Cabazitaxel Injection What is this medication? CABAZITAXEL (ka BAZ i TAX el) treats prostate cancer. It works by slowing down the growth of cancer cells. This medicine may be used for other purposes; ask your health care provider or pharmacist if you have questions. COMMON BRAND NAME(S): Jevtana What should I tell my care team before I take this medication? They need to know if you have any of these conditions: Kidney problems Liver disease Low white blood cell levels Lung disease Stomach or intestine problems An unusual or allergic reaction to cabazitaxel, polysorbate 80, other medications, foods, dyes, or preservatives Pregnant or trying to get pregnant Breast-feeding How  should I use this medication? This medication is injected into a vein. It is given by your care team in a hospital or clinic setting. Talk to your care team about the use of this medication in children. Special care may be needed. Overdosage: If you think you have taken too much of this medicine contact a poison control center or emergency room at once. NOTE: This medicine is only for you. Do not share this medicine with others. What if I miss a dose? Keep appointments for follow-up doses. It is important not to miss your dose. Call your care team if you are unable to keep an appointment. What may interact with this medication? Certain antibiotics, such as clarithromycin or telithromycin Certain antivirals for HIV or AIDS Certain medications for fungal infections like ketoconazole, itraconazole, and voriconazole Nefazodone This list may not describe all possible interactions. Give your health care provider a list of all the medicines, herbs, non-prescription drugs, or dietary supplements you use. Also tell them if you smoke, drink alcohol, or use illegal drugs. Some items may interact with your medicine. What should I watch for while using this medication? This medication may make you feel generally unwell. This is not uncommon as chemotherapy can affect healthy cells as well as cancer cells. Report any side effects. Continue your course of treatment even though you feel ill unless your care team tells you to stop. You may need blood work while you are taking this medication. This medication may increase your risk of getting an infection. Call your care team for advice if you get a  fever, chills, sore throat, or other symptoms of a cold or flu. Do not treat yourself. Try to avoid being around people who are sick. Avoid taking medications that contain aspirin, acetaminophen, ibuprofen, naproxen, or ketoprofen unless instructed by your care team. These medications may hide a fever. Be careful  brushing or flossing your teeth or using a toothpick because you may get an infection or bleed more easily. If you have any dental work done, tell your dentist you are receiving this medication. This medication can cause serious infusion reactions. To reduce the risk, your care team may give you other medications to take before receiving this one. Be sure to follow the directions from your care team. Use a condom during sex while taking this medication and for 4 months after the last dose. Talk to your care team right away if your partner may be pregnant. This medication can cause serious birth defects. This medication may cause infertility. Talk to your care team if you are concerned about your fertility. What side effects may I notice from receiving this medication? Side effects that you should report to your care team as soon as possible: Allergic reactions--skin rash, itching, hives, swelling of the face, lips, tongue, or throat Diarrhea, nausea, vomiting Dry cough, shortness of breath or trouble breathing Infection--fever, chills, cough, or sore throat Kidney injury--decrease in the amount of urine, swelling of the ankles, hands, or feet Pain, tingling, or numbness in the hands or feet Red or dark brown urine Stomach bleeding--bloody or black, tar-like stools, vomiting blood or brown material that looks like coffee grounds Stomach pain that is severe, does not away, or gets worse Unusual bruising or bleeding Side effects that usually do not require medical attention (report these to your care team if they continue or are bothersome): Loss of appetite Unusual weakness or fatigue This list may not describe all possible side effects. Call your doctor for medical advice about side effects. You may report side effects to FDA at 1-800-FDA-1088. Where should I keep my medication? This medication is given in a hospital or clinic. It will not be stored at home. NOTE: This sheet is a summary. It may  not cover all possible information. If you have questions about this medicine, talk to your doctor, pharmacist, or health care provider.  2023 Elsevier/Gold Standard (2009-02-03 00:00:00)   BELOW ARE SYMPTOMS THAT SHOULD BE REPORTED IMMEDIATELY: *FEVER GREATER THAN 100.4 F (38 C) OR HIGHER *CHILLS OR SWEATING *NAUSEA AND VOMITING THAT IS NOT CONTROLLED WITH YOUR NAUSEA MEDICATION *UNUSUAL SHORTNESS OF BREATH *UNUSUAL BRUISING OR BLEEDING *URINARY PROBLEMS (pain or burning when urinating, or frequent urination) *BOWEL PROBLEMS (unusual diarrhea, constipation, pain near the anus) TENDERNESS IN MOUTH AND THROAT WITH OR WITHOUT PRESENCE OF ULCERS (sore throat, sores in mouth, or a toothache) UNUSUAL RASH, SWELLING OR PAIN  UNUSUAL VAGINAL DISCHARGE OR ITCHING   Items with * indicate a potential emergency and should be followed up as soon as possible or go to the Emergency Department if any problems should occur.  Please show the CHEMOTHERAPY ALERT CARD or IMMUNOTHERAPY ALERT CARD at check-in to the Emergency Department and triage nurse.  Should you have questions after your visit or need to cancel or reschedule your appointment, please contact [facility] [phone]  and follow the prompts.  Office hours are 8:00 a.m. to 4:30 p.m. Monday - Friday. Please note that voicemails left after 4:00 p.m. may not be returned until the following business day.  We are  closed weekends and major holidays. You have access to a nurse at all times for urgent questions. Please call the main number to the clinic [phone] and follow the prompts.  For any non-urgent questions, you may also contact your provider using MyChart. We now offer e-Visits for anyone 21 and older to request care online for non-urgent symptoms. For details visit [email or web address].   Also download the MyChart app! Go to the app store, search "MyChart", open the app, select [facility], and log in with your  MyChart username and password.

## 2022-11-22 ENCOUNTER — Telehealth: Payer: Self-pay

## 2022-11-22 ENCOUNTER — Other Ambulatory Visit: Payer: Self-pay

## 2022-11-22 NOTE — Telephone Encounter (Signed)
Patient called for 24-hour follow-up, patient states he overall feels good, complains of some irritability possibly d/t steroid and reports incontinence during the night last night which he states has happened before but has been a while, patient was concerned with timing of incontinence since this was unusual. Patient advise to continue to monitor incontinence and report to cancer center if incontinence continues to occur, patient verbalized understanding.

## 2022-11-23 ENCOUNTER — Other Ambulatory Visit: Payer: Self-pay | Admitting: *Deleted

## 2022-11-23 ENCOUNTER — Inpatient Hospital Stay: Payer: Medicare Other

## 2022-11-23 ENCOUNTER — Telehealth: Payer: Self-pay | Admitting: *Deleted

## 2022-11-23 VITALS — BP 150/89 | HR 72 | Temp 98.0°F | Resp 18 | Wt 144.2 lb

## 2022-11-23 DIAGNOSIS — Z5111 Encounter for antineoplastic chemotherapy: Secondary | ICD-10-CM | POA: Diagnosis not present

## 2022-11-23 DIAGNOSIS — R350 Frequency of micturition: Secondary | ICD-10-CM

## 2022-11-23 DIAGNOSIS — C61 Malignant neoplasm of prostate: Secondary | ICD-10-CM

## 2022-11-23 DIAGNOSIS — Z95828 Presence of other vascular implants and grafts: Secondary | ICD-10-CM

## 2022-11-23 LAB — URINALYSIS, ROUTINE W REFLEX MICROSCOPIC
Bilirubin Urine: NEGATIVE
Glucose, UA: NEGATIVE mg/dL
Hgb urine dipstick: NEGATIVE
Ketones, ur: 5 mg/dL — AB
Leukocytes,Ua: NEGATIVE
Nitrite: NEGATIVE
Protein, ur: NEGATIVE mg/dL
Specific Gravity, Urine: 1.013 (ref 1.005–1.030)
pH: 6 (ref 5.0–8.0)

## 2022-11-23 MED ORDER — PEGFILGRASTIM-FPGK 6 MG/0.6ML ~~LOC~~ SOSY
6.0000 mg | PREFILLED_SYRINGE | Freq: Once | SUBCUTANEOUS | Status: AC
  Administered 2022-11-23: 6 mg via SUBCUTANEOUS
  Filled 2022-11-23: qty 0.6

## 2022-11-23 NOTE — Progress Notes (Signed)
Patient tolerated injection with no complaints voiced. Site clean and dry with no bruising or swelling noted at site. See MAR for details. Band aid applied.  Patient stable during and after injection. VSS with discharge and left in satisfactory condition with no s/s of distress noted.  

## 2022-11-23 NOTE — Patient Instructions (Signed)
[facility]  Discharge Instructions: Thank you for choosing [facility] to provide your oncology and hematology care.  If you have a lab appointment with the Cancer Center - please note that after April 8th, 2024, all labs will be drawn in the cancer center.  You do not have to check in or register with the main entrance as you have in the past but will complete your check-in in the cancer center.  Wear comfortable clothing and clothing appropriate for easy access to any Portacath or PICC line.   We strive to give you quality time with your provider. You may need to reschedule your appointment if you arrive late (15 or more minutes).  Arriving late affects you and other patients whose appointments are after yours.  Also, if you miss three or more appointments without notifying the office, you may be dismissed from the clinic at the provider's discretion.      For prescription refill requests, have your pharmacy contact our office and allow 72 hours for refills to be completed.    Today you received the following stimufend, return as scheduled.   To help prevent nausea and vomiting after your treatment, we encourage you to take your nausea medication as directed.  BELOW ARE SYMPTOMS THAT SHOULD BE REPORTED IMMEDIATELY: *FEVER GREATER THAN 100.4 F (38 C) OR HIGHER *CHILLS OR SWEATING *NAUSEA AND VOMITING THAT IS NOT CONTROLLED WITH YOUR NAUSEA MEDICATION *UNUSUAL SHORTNESS OF BREATH *UNUSUAL BRUISING OR BLEEDING *URINARY PROBLEMS (pain or burning when urinating, or frequent urination) *BOWEL PROBLEMS (unusual diarrhea, constipation, pain near the anus) TENDERNESS IN MOUTH AND THROAT WITH OR WITHOUT PRESENCE OF ULCERS (sore throat, sores in mouth, or a toothache) UNUSUAL RASH, SWELLING OR PAIN  UNUSUAL VAGINAL DISCHARGE OR ITCHING   Items with * indicate a potential emergency and should be followed up as soon as possible or go to the Emergency Department if  any problems should occur.  Please show the CHEMOTHERAPY ALERT CARD or IMMUNOTHERAPY ALERT CARD at check-in to the Emergency Department and triage nurse.  Should you have questions after your visit or need to cancel or reschedule your appointment, please contact [facility] AT [facility] [phone]  and follow the prompts.  Office hours are 8:00 a.m. to 4:30 p.m. Monday - Friday. Please note that voicemails left after 4:00 p.m. may not be returned until the following business day.  We are closed weekends and major holidays. You have access to a nurse at all times for urgent questions. Please call the main number to the clinic [phone] and follow the prompts.  For any non-urgent questions, you may also contact your provider using MyChart. We now offer e-Visits for anyone 59 and older to request care online for non-urgent symptoms. For details visit [email or web address].   Also download the MyChart app! Go to the app store, search "MyChart", open the app, select [place], and log in with your MyChart username and password.

## 2022-11-23 NOTE — Telephone Encounter (Signed)
Opened in error

## 2022-11-23 NOTE — Telephone Encounter (Addendum)
Patient called to advise that he has been wetting the bed since chemo infusion.  States that he had this issue when he was on chemo 11 years ago.  Is on for symptom management with Rojelio Brenner - PAC on Tuesday and she was made aware to address.  Per Lupe Carney, will obtain urinalysis today when he comes in for injection.  Patient made aware.

## 2022-11-24 ENCOUNTER — Other Ambulatory Visit: Payer: Self-pay

## 2022-11-25 ENCOUNTER — Other Ambulatory Visit: Payer: Self-pay

## 2022-11-27 ENCOUNTER — Other Ambulatory Visit: Payer: Self-pay

## 2022-11-27 DIAGNOSIS — Z95828 Presence of other vascular implants and grafts: Secondary | ICD-10-CM

## 2022-11-27 DIAGNOSIS — C61 Malignant neoplasm of prostate: Secondary | ICD-10-CM

## 2022-11-27 NOTE — Progress Notes (Unsigned)
[facility] [address], [place] [address] Phone: [phone] Fax: [phone]  SYMPTOM MANAGEMENT CLINIC PROGRESS NOTE   [facility] [id number] 28-Dec-1950 72 y.o.  [facility] is managed by Dr. 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  He received Cycle #1 / Day #1 of cabazitaxel on 11/21/2022, with GCSF (Stimufend) given on 11/23/2022.    Patient reports that he felt "pretty good" for about 2 days after chemotherapy, but after he received his G-CSF injection he had extreme fatigue for 2 days with onset of throbbing chest pain and back pain after G-CSF injection that lasted for about 4 hours.  He continues to have some moderate fatigue but feels that he is "starting to bounce back."  Patient is concerned about increased anxiety, irritability, and anger attributed to side effects from prednisone.  He is adamant that he would like to stop prednisone at this time.  He has had some mild nausea without vomiting.  He reports chronic diarrhea for the past 10 years, but had 2 days of constipation following his first dose of chemotherapy.  He reports that he has been eating normal nutritional intake, and drinking about 48 ounces of water and 12 ounces of Powerade daily.  He is also drinking 1 to 2 glasses of wine or 1 beer nightly, which is decreased from his alcoholic intake prior to starting treatment.  He had an episode of nocturnal urinary incontinence after receiving his first cycle of chemotherapy.  He denies any dysuria, hematuria, or symptoms concerning for UTI.    He denies any mouth sores, skin changes,  swelling, neuropathy, new onset pain.   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  No evidence of liver metastases. 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   - Received cycle #1 of cabazitaxel on 11/21/2022  - PLAN: Visit with Dr. Ellin Saba and cycle #2 of chemotherapy scheduled for 12/12/2022 - Patient would like to discontinue his G-CSF injections with next cycle of chemotherapy, as he reports his adverse side effects did not start until after G-CSF injection.  Discussed with patient that without G-CSF he would be at high risk for neutropenia and neutropenic infection.  Patient will discuss further with Dr. Ellin Saba at next visit.  # Urinary incontinence, nocturnal  - Nocturnal urinary incontinence since receiving his first cycle of chemotherapy.   - Denies any dysuria, hematuria, or symptoms concerning for UTI. - Urinalysis (11/23/2022): Mild ketonuria with ketones 5.  No evidence of UTI.  # Irritability - Patient is concerned about increased anxiety, irritability, and anger attributed to side effects from prednisone. He is adamant that he would like to stop prednisone at this time.  - PLAN: Discussed with Dr. Ellin Saba, and we will discontinue prednisone at  this time  # Alcohol consumption - Currently drinking 3-4 alcoholic beverages nightly (1 to 2 glasses of wine + 1 beer) - Previously was drinking 3 to 4 glasses of wine + 2 beers nightly - PLAN: Recommend cutting back to 1 alcoholic beverage nightly  # GOALS OF CARE:   - Patient  questioned "whether or not it is worth it" to continue cancer treatment - Discussed with patient that only he can decide what is "worth it" or not, depending on what is most important to him. - Patient expresses that he "wants to keep living since he still has music inside him."  Ultimately, he decided that he wants to continue treatment, because being able to live longer and continue to have "good days" is worth the "bad days" that he experiences after chemotherapy.  PLAN SUMMARY: >> Next appointment with Dr. Ellin Saba (medical oncologist): 12/12/2022    REVIEW OF SYSTEMS:   Review of Systems  Constitutional:  Positive for fatigue. Negative for activity change, appetite change, chills, diaphoresis, fever and unexpected weight change.  HENT:  Negative for mouth sores, nosebleeds, sore throat and trouble swallowing.   Respiratory:  Negative for cough and shortness of breath.   Cardiovascular:  Positive for chest pain (throbbing chest pain after GCSF injection, now resolved) and palpitations. Negative for leg swelling.  Gastrointestinal:  Positive for constipation, diarrhea and nausea ("acid reflux" per patient). Negative for abdominal pain, blood in stool and vomiting.  Endocrine: Positive for heat intolerance (hot flashes).  Genitourinary:  Negative for dysuria and hematuria.       Urinary incontinence x 1  Neurological:  Negative for dizziness, light-headedness, numbness and headaches.  Psychiatric/Behavioral:  Positive for sleep disturbance. Negative for dysphoric mood. The patient is not nervous/anxious.     Past Medical History, Surgical history, Social history, and Family history were reviewed as documented elsewhere in chart, and were updated as appropriate.   OBJECTIVE:  Physical Exam:  There were no vitals taken for this visit. ECOG: 1  Physical Exam Constitutional:      Appearance: Normal appearance. He is normal weight.     Comments: Thin body habitus  Cardiovascular:      Heart sounds: Normal heart sounds.  Pulmonary:     Breath sounds: Normal breath sounds.  Neurological:     General: No focal deficit present.     Mental Status: Mental status is at baseline.  Psychiatric:        Behavior: Behavior normal. Behavior is cooperative.    Lab Review:     Component Value Date/Time   NA 135 11/21/2022 1000   K 4.2 11/21/2022 1000   CL 102 11/21/2022 1000   CO2 29 11/21/2022 1000   GLUCOSE 65 (L) 11/21/2022 1000   BUN 14 11/21/2022 1000   CREATININE 0.94 11/21/2022 1000   CALCIUM 8.8 (L) 11/21/2022 1000   PROT 6.7 11/21/2022 1000   ALBUMIN 3.9 11/21/2022 1000   AST 20 11/21/2022 1000   ALT 13 11/21/2022 1000   ALKPHOS 64 11/21/2022 1000   BILITOT 0.4 11/21/2022 1000   GFRNONAA >60 11/21/2022 1000   GFRAA >60 04/20/2020 1326       Component Value Date/Time   WBC 5.0 11/21/2022 1000   RBC 4.28 11/21/2022 1000   HGB 14.3 11/21/2022 1000   HCT 43.5 11/21/2022 1000   PLT 240 11/21/2022 1000   MCV 101.6 (H) 11/21/2022 1000   MCH 33.4 11/21/2022 1000   MCHC 32.9 11/21/2022 1000   RDW 13.8  11/21/2022 1000   LYMPHSABS 0.6 (L) 11/21/2022 1000   MONOABS 0.4 11/21/2022 1000   EOSABS 0.1 11/21/2022 1000   BASOSABS 0.0 11/21/2022 1000   -------------------------------  Imaging from last 24 hours (if applicable): Radiology interpretation: IR IMAGING GUIDED PORT INSERTION  Result Date: 11/16/2022 INDICATION: Prostate cancer. EXAM: IMPLANTED PORT A CATH PLACEMENT WITH ULTRASOUND AND FLUOROSCOPIC GUIDANCE MEDICATIONS: None ANESTHESIA/SEDATION: Local anesthetic and single agent sedation was employed during this procedure. A total of Versed 3 mg was administered intravenously. The patient's level of consciousness and vital signs were monitored continuously by radiology nursing throughout the procedure under my direct supervision. FLUOROSCOPY TIME:  Fluoroscopic dose; 1 mGy COMPLICATIONS: None immediate. PROCEDURE: The procedure, risks, benefits, and  alternatives were explained to the patient. Questions regarding the procedure were encouraged and answered. The patient understands and consents to the procedure. The RIGHT neck and chest were prepped with chlorhexidine in a sterile fashion, and a sterile drape was applied covering the operative field. Maximum barrier sterile technique with sterile gowns and gloves were used for the procedure. A timeout was performed prior to the initiation of the procedure. Local anesthesia was provided with 1% lidocaine with epinephrine. After creating a small venotomy incision, a micropuncture kit was utilized to access the internal jugular vein under direct, real-time ultrasound guidance. Ultrasound image documentation was performed. The microwire was kinked to measure appropriate catheter length. A subcutaneous port pocket was then created along the upper chest wall utilizing a combination of sharp and blunt dissection. The pocket was irrigated with sterile saline. A single lumen ISP power injectable port was chosen for placement. The 8 Fr catheter was tunneled from the port pocket site to the venotomy incision. The port was placed in the pocket. The external catheter was trimmed to appropriate length. At the venotomy, an 8 Fr peel-away sheath was placed over a guidewire under fluoroscopic guidance. The catheter was then placed through the sheath and the sheath was removed. Final catheter positioning was confirmed and documented with a fluoroscopic spot radiograph. The port was accessed with a Huber needle, aspirated and flushed with heparinized saline. The port pocket incision was closed with interrupted 3-0 Vicryl suture then Dermabond was applied, including at the venotomy incision. Dressings were placed. The patient tolerated the procedure well without immediate post procedural complication. IMPRESSION: Successful placement of a RIGHT internal jugular approach power injectable Port-A-Cath. The tip of the catheter is  positioned at the superior cavo-atrial junction. The catheter is ready for immediate use. Roanna Banning, MD [facility] Electronically Signed   By: Roanna Banning M.D.   On: 11/16/2022 16:12      WRAP UP:  All questions were answered. The patient knows to call the clinic with any problems, questions or concerns.  Medical decision making: High  Time spent on visit: I spent 45 minutes counseling the patient face to face. The total time spent in the appointment was 65 minutes and more than 50% was on counseling. Extensive time during patient visit spent discussing goals of care and answering his questions regarding treatment and side effects.  Carnella Guadalajara, PA-C  11/28/22 12:20 PM

## 2022-11-28 ENCOUNTER — Inpatient Hospital Stay: Payer: Medicare Other

## 2022-11-28 ENCOUNTER — Inpatient Hospital Stay (HOSPITAL_BASED_OUTPATIENT_CLINIC_OR_DEPARTMENT_OTHER): Payer: Medicare Other | Admitting: Physician Assistant

## 2022-11-28 VITALS — BP 146/78 | HR 82 | Temp 96.6°F | Resp 18 | Wt 145.8 lb

## 2022-11-28 DIAGNOSIS — C61 Malignant neoplasm of prostate: Secondary | ICD-10-CM

## 2022-11-28 DIAGNOSIS — Z95828 Presence of other vascular implants and grafts: Secondary | ICD-10-CM

## 2022-11-28 DIAGNOSIS — Z09 Encounter for follow-up examination after completed treatment for conditions other than malignant neoplasm: Secondary | ICD-10-CM | POA: Diagnosis not present

## 2022-11-28 DIAGNOSIS — Z5111 Encounter for antineoplastic chemotherapy: Secondary | ICD-10-CM | POA: Diagnosis not present

## 2022-11-28 LAB — CBC WITH DIFFERENTIAL/PLATELET
Abs Immature Granulocytes: 0.9 10*3/uL — ABNORMAL HIGH (ref 0.00–0.07)
Band Neutrophils: 13 %
Basophils Absolute: 0 10*3/uL (ref 0.0–0.1)
Basophils Relative: 0 %
Eosinophils Absolute: 0.2 10*3/uL (ref 0.0–0.5)
Eosinophils Relative: 1 %
HCT: 41.8 % (ref 39.0–52.0)
Hemoglobin: 13.7 g/dL (ref 13.0–17.0)
Lymphocytes Relative: 11 %
Lymphs Abs: 2.5 10*3/uL (ref 0.7–4.0)
MCH: 33.5 pg (ref 26.0–34.0)
MCHC: 32.8 g/dL (ref 30.0–36.0)
MCV: 102.2 fL — ABNORMAL HIGH (ref 80.0–100.0)
Metamyelocytes Relative: 1 %
Monocytes Absolute: 2 10*3/uL — ABNORMAL HIGH (ref 0.1–1.0)
Monocytes Relative: 9 %
Myelocytes: 3 %
Neutro Abs: 17 10*3/uL — ABNORMAL HIGH (ref 1.7–7.7)
Neutrophils Relative %: 62 %
Platelets: 224 10*3/uL (ref 150–400)
RBC: 4.09 MIL/uL — ABNORMAL LOW (ref 4.22–5.81)
RDW: 13.8 % (ref 11.5–15.5)
WBC: 22.7 10*3/uL — ABNORMAL HIGH (ref 4.0–10.5)
nRBC: 0.3 % — ABNORMAL HIGH (ref 0.0–0.2)

## 2022-11-28 LAB — COMPREHENSIVE METABOLIC PANEL
ALT: 12 U/L (ref 0–44)
AST: 21 U/L (ref 15–41)
Albumin: 3.6 g/dL (ref 3.5–5.0)
Alkaline Phosphatase: 124 U/L (ref 38–126)
Anion gap: 10 (ref 5–15)
BUN: 13 mg/dL (ref 8–23)
CO2: 25 mmol/L (ref 22–32)
Calcium: 8.9 mg/dL (ref 8.9–10.3)
Chloride: 101 mmol/L (ref 98–111)
Creatinine, Ser: 0.9 mg/dL (ref 0.61–1.24)
GFR, Estimated: >60 mL/min (ref >60)
Glucose, Bld: 102 mg/dL — ABNORMAL HIGH (ref 70–99)
Potassium: 3.9 mmol/L (ref 3.5–5.1)
Sodium: 136 mmol/L (ref 135–145)
Total Bilirubin: 0.5 mg/dL (ref 0.3–1.2)
Total Protein: 6.3 g/dL — ABNORMAL LOW (ref 6.5–8.1)

## 2022-11-28 LAB — MAGNESIUM: Magnesium: 1.9 mg/dL (ref 1.7–2.4)

## 2022-11-28 MED ORDER — SODIUM CHLORIDE 0.9% FLUSH
10.0000 mL | Freq: Once | INTRAVENOUS | Status: AC
  Administered 2022-11-28: 10 mL via INTRAVENOUS

## 2022-11-28 MED ORDER — HEPARIN SOD (PORK) LOCK FLUSH 100 UNIT/ML IV SOLN
500.0000 [IU] | Freq: Once | INTRAVENOUS | Status: AC
  Administered 2022-11-28: 500 [IU] via INTRAVENOUS

## 2022-11-28 NOTE — Patient Instructions (Addendum)
[facility] **VISIT SUMMARY & IMPORTANT INSTRUCTIONS **   You were seen today by Rojelio Brenner PA-C for your chemotherapy follow-up and symptom management visit.    Per Dr. Ellin Saba, it is okay for you to STOP taking prednisone due to severe irritability and side effects.  It would be best to continue receiving white blood cell injections after each treatment.  Not taking them would place you at high risk of low white blood cells and infections.   You may drink 1 alcoholic beverage daily.  NUTRITION: Follow recommendations as given by dietitian. Try to eat small frequent meals throughout the day. Include a healthy source of protein such as cheese, yogurt, eggs, lean meats, beans, nuts, or nut-butters. Eat a snack at night before bedtime.  DEHYDRATION: It is very important that you remain hydrated while you are receiving treatment!   Make sure that you are drinking at least 64 ounces of water each day.  You can drink juice, decaffeinated drinks, and sugar free beverages in addition to water, but should still drink plenty of water. Limit the amount of soda you drink. Although plain water is the best for you, you can try adding sugar-free/caffeine-free flavor packs (ex: Pedialyte) to change the flavor. Popsicles, soup, and ice cream are great ways to add even more fluids! Mix your juice and Gatorade with some water to increase your water intake.  FOLLOW-UP APPOINTMENT: Office visit and cycle #2 of chemotherapy on 12/12/2022  ** Thank you for trusting me with your healthcare!  I strive to provide all of my patients with quality care at each visit.  If you receive a survey for this visit, I would be so grateful to you for taking the time to provide feedback.  Thank you in advance!  ~ Giah Fickett                   Dr. Doreatha Massed   &   Rojelio Brenner, PA-C   - - - - - - - - - - - - - - - - - -    Thank you for choosing [facility] to provide your oncology and hematology care.  To afford each patient quality time with our provider, please arrive at least 15 minutes before your scheduled appointment time.   If you have a lab appointment with the Cancer Center please come in thru the Main Entrance and check in at the main information desk.  You need to re-schedule your appointment should you arrive 10 or more minutes late.  We strive to give you quality time with our providers, and arriving late affects you and other patients whose appointments are after yours.  Also, if you no show three or more times for appointments you may be dismissed from the clinic at the providers discretion.     Again, thank you for choosing [facility].  Our hope is that these requests will decrease the amount of time that you wait before being seen by our physicians.       _____________________________________________________________  Should you have questions after your visit to [facility], please contact our office at [phone] and follow the prompts.  Our office hours are 8:00 a.m. and 4:30 p.m. Monday - Friday.  Please note that voicemails left after 4:00 p.m. may not be returned until the following business day.  We are closed weekends and major holidays.  You  do have access to a nurse 24-7, just call the main number to the clinic [phone] and do not press any options, hold on the line and a nurse will answer the phone.    For prescription refill requests, have your pharmacy contact our office and allow 72 hours.

## 2022-11-30 ENCOUNTER — Inpatient Hospital Stay: Payer: Medicare Other | Admitting: Dietician

## 2022-11-30 ENCOUNTER — Telehealth: Payer: Self-pay | Admitting: Dietician

## 2022-11-30 ENCOUNTER — Ambulatory Visit (INDEPENDENT_AMBULATORY_CARE_PROVIDER_SITE_OTHER): Payer: Medicare Other | Admitting: Behavioral Health

## 2022-11-30 DIAGNOSIS — R454 Irritability and anger: Secondary | ICD-10-CM

## 2022-11-30 DIAGNOSIS — F411 Generalized anxiety disorder: Secondary | ICD-10-CM | POA: Diagnosis not present

## 2022-11-30 DIAGNOSIS — F331 Major depressive disorder, recurrent, moderate: Secondary | ICD-10-CM

## 2022-11-30 NOTE — Progress Notes (Signed)
[facility] Counselor/Therapist Progress Note  Patient ID: Nathan Smith, MRN: [id number],    Date: 11/30/2022  Time Spent: 56 minutes This session was held via video session.  The patient was at home and this therapist was in his home office.  Treatment Type: Individual Therapy  Reported Symptoms: Little distress  Mental Status Exam: Appearance:  Well Groomed     Behavior: Appropriate  Motor: Normal  Speech/Language:  Normal Rate  Affect: Appropriate  Mood: normal  Thought process: normal  Thought content:   WNL  Sensory/Perceptual disturbances:   WNL  Orientation: oriented to person, place, time/date, and situation  Attention: Good  Concentration: Good  Memory: WNL  Fund of knowledge:  Good  Insight:   Fair  Judgment:  Fair  Impulse Control: Good   Risk Assessment: Danger to Self:  No Self-injurious Behavior: No Danger to Others: No Duty to Warn:no Physical Aggression / Violence:No  Access to Firearms a concern: No  Gang Involvement:No   Subjective: Nathan Smith is a patient that was seeing Dr. Hilbert Corrigan previously.  He presents with symptoms of anxiety depression and irritability primarily related to an 11-year history with cancer.  It had been treated with injections and pills over the years and had been in remission but recently moved into his spine and into a lymph node.  He has noticed an increase in anxiety depression and irritability since going to IV chemo through a Port-A-Cath.  There have been side effects of nausea and vomiting but he also has good days and says today has been a pretty good day.  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  He knows that if he were not to take the treatment he probably would have no more than 1 year to live.  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  There is a family mental health history as specially with his mother who was diagnosed with major depressive disorder, schizoaffective disorder as well as bipolar disorder.  He remembers as young as 72 years old knowing that something was not right with his mother.  At times he says he sees things about himself and his mom but did not elaborate and we will explore that more in future sessions.  He remembers that she was on a lot of medication and for that reason he is hesitant to take much in the way of psychotropic medications.  Years ago he did try Paxil but said 2 days he felt horrible and was not sleeping so as to be taken off of it and has not done anything other than homeopathic medications.  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  He recently started taking delta 8 which he says helps reduce anxiety significantly.  He said 1 puff helps alleviate anxiety for up to a day and times a bit more.  There are other natural supplements and medications that he takes.  He reports a history of significant drug and alcohol use.  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  In the 1970s he reported heavy use of LSD saying he probably 120 trips but that also led him to  a spiritual search which has led to his belief system today.  He was influenced by a gentleman named Duaine Dredge.  Other drug use included mushrooms and cocaine and said that he did some sort of drug 2-3 times a day for many years.  He stopped drugs 10 to 15 years ago.  He also has a history of alcohol abuse and says that he probably is still considered an alcoholic.  Until recently he was drinking about 7 drinks per day but at his worst was drinking 14 drinks per day when he was younger and did go to AA.  Because of his treatment he now drinks about 1 to 1-1/2 glasses of wine per day and much more than that he just feels bad.  He says it also does  not taste good anymore and sometimes it hurts his stomach.  The patient reports that in spite of all that he is going through a treatment his sleep is still fairly good.  He does take melatonin and has for years.  He reports no nightmares or no bad dreams.  He does have an enlarged prostrate and some constipation and occasion they do wake him up but he goes back to sleep fairly easily.  He also reports a good appetite in spite of going through treatment.  He also takes herbs and other natural nutrients to help with appetite including good vitamins probiotics and eats a lot of fruits and vegetables.  The patient is not married and does not have children.  His biggest supports are his church family.  There is a long history of anxiety but indicates that his anxiety has ramped up significantly lately especially since starting chemotherapy.  He says that he goes from one anxiety thought to another and they all in the moment feel overwhelming although he does at times they are silly.  He has thoughts of being fired although his charge loves him, or who will be the next president, with a state of the world.  He also reports a significant increase in irritability related to his diagnosis and treatment.  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  He does report some periodic depression in relation to his diagnosis and treatment.  The cancer is in one spot in his spine now but has been told that it starts giving up his spine things to get significantly worse for him.  He tends to worry focus those thoughts and focused on the good in his life and what he can do through his music.  The patient loves to play guitar and singing.  He has recorded several CDs over the years.  He has also practiced taekwondo over the years and still does that as he has energy.  He does watch some TV especially at night.  He expressed a frustrating situation with a urologist that he had for  11 years.  The patient said he was expressing how bad he felt especially after starting the treatment but was talking more about depression and anxiety and irritability.  He said the doctor basically said that the patient will need to talk to a "shrink" about that.  He also said that another option would be to not to continue treatment.  He said after 11 years into conversations like that he left the urologist and is now being completely treated by his oncologist whom he does not like.  He said he was just shocked by the fact  that a medical professional would say those type of things to him.  The patient has no thoughts of self-harm and does contract for safety.  He is fighting very hard for the quality of life that he can have.  He would like to coping skills to use for reduction of anxiety and irritability and depression and would prefer to meet in person. Interventions:  Homework: NA  Next Session: I will meet with the patient every 3 to 4 weeks.  He prefers that they be in person.  He is scheduled for an in person visit on May 21.  Diagnosis: F33.1 major depressive affective disorder, recurrent, moderate and F41.1 generalized anxiety disorder  Plan:     [name], [facility]

## 2022-11-30 NOTE — Telephone Encounter (Signed)
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 He is currently receiving palliative chemotherapy with cabazitaxel + prednisone (planned 4/11).   Spoke with patient via telephone. He reports tolerating treatment well overall. He does not enjoy all of the appointments. States treatment has become invasive and has no control of his life. He plans to discuss alternate options with oncologist. Patient reports appetite is about the same. Patient had episode of diarrhea after eating at a church function last week. Patient attributes this to the fried chicken. He is drinking plenty of water.    Medications: reviewed   Labs: reviewed   Anthropometrics: Wt 145 lb 12.8 oz slowly trending up  4/18 - 144 lb 3.2 oz 4/11 - 144 lb  3/28 - 141 lb 3.2 oz    NUTRITION DIAGNOSIS: Unintended weight loss stable    INTERVENTION:  Encouraged small frequent meals and snacks with adequate calories and protein Support and encouragement    MONITORING, EVALUATION, GOAL: weight trends, intake   NEXT VISIT: No follow-up scheduled at this time. Patient will contact with nutrition questions/concerns

## 2022-12-12 ENCOUNTER — Inpatient Hospital Stay: Payer: Medicare Other | Attending: Hematology

## 2022-12-12 ENCOUNTER — Encounter: Payer: Self-pay | Admitting: Hematology

## 2022-12-12 ENCOUNTER — Inpatient Hospital Stay: Payer: Medicare Other

## 2022-12-12 ENCOUNTER — Inpatient Hospital Stay (HOSPITAL_BASED_OUTPATIENT_CLINIC_OR_DEPARTMENT_OTHER): Payer: Medicare Other | Admitting: Hematology

## 2022-12-12 VITALS — BP 109/70 | HR 59 | Temp 97.7°F | Resp 18

## 2022-12-12 DIAGNOSIS — C7951 Secondary malignant neoplasm of bone: Secondary | ICD-10-CM | POA: Diagnosis present

## 2022-12-12 DIAGNOSIS — Z5111 Encounter for antineoplastic chemotherapy: Secondary | ICD-10-CM | POA: Insufficient documentation

## 2022-12-12 DIAGNOSIS — R5383 Other fatigue: Secondary | ICD-10-CM | POA: Diagnosis not present

## 2022-12-12 DIAGNOSIS — Z95828 Presence of other vascular implants and grafts: Secondary | ICD-10-CM

## 2022-12-12 DIAGNOSIS — Z8042 Family history of malignant neoplasm of prostate: Secondary | ICD-10-CM | POA: Diagnosis not present

## 2022-12-12 DIAGNOSIS — C61 Malignant neoplasm of prostate: Secondary | ICD-10-CM | POA: Diagnosis present

## 2022-12-12 DIAGNOSIS — Z923 Personal history of irradiation: Secondary | ICD-10-CM | POA: Diagnosis not present

## 2022-12-12 DIAGNOSIS — Z79899 Other long term (current) drug therapy: Secondary | ICD-10-CM | POA: Insufficient documentation

## 2022-12-12 DIAGNOSIS — I10 Essential (primary) hypertension: Secondary | ICD-10-CM | POA: Insufficient documentation

## 2022-12-12 DIAGNOSIS — Z1509 Genetic susceptibility to other malignant neoplasm: Secondary | ICD-10-CM | POA: Insufficient documentation

## 2022-12-12 DIAGNOSIS — F419 Anxiety disorder, unspecified: Secondary | ICD-10-CM | POA: Insufficient documentation

## 2022-12-12 LAB — CBC WITH DIFFERENTIAL/PLATELET
Abs Immature Granulocytes: 0.02 10*3/uL (ref 0.00–0.07)
Basophils Absolute: 0.1 10*3/uL (ref 0.0–0.1)
Basophils Relative: 1 %
Eosinophils Absolute: 0 10*3/uL (ref 0.0–0.5)
Eosinophils Relative: 1 %
HCT: 42.2 % (ref 39.0–52.0)
Hemoglobin: 13.9 g/dL (ref 13.0–17.0)
Immature Granulocytes: 0 %
Lymphocytes Relative: 12 %
Lymphs Abs: 0.9 10*3/uL (ref 0.7–4.0)
MCH: 33.1 pg (ref 26.0–34.0)
MCHC: 32.9 g/dL (ref 30.0–36.0)
MCV: 100.5 fL — ABNORMAL HIGH (ref 80.0–100.0)
Monocytes Absolute: 0.6 10*3/uL (ref 0.1–1.0)
Monocytes Relative: 9 %
Neutro Abs: 5.5 10*3/uL (ref 1.7–7.7)
Neutrophils Relative %: 77 %
Platelets: 380 10*3/uL (ref 150–400)
RBC: 4.2 MIL/uL — ABNORMAL LOW (ref 4.22–5.81)
RDW: 13.6 % (ref 11.5–15.5)
WBC: 7.1 10*3/uL (ref 4.0–10.5)
nRBC: 0 % (ref 0.0–0.2)

## 2022-12-12 LAB — PSA: Prostatic Specific Antigen: 4.02 ng/mL — ABNORMAL HIGH (ref 0.00–4.00)

## 2022-12-12 LAB — COMPREHENSIVE METABOLIC PANEL
ALT: 14 U/L (ref 0–44)
AST: 20 U/L (ref 15–41)
Albumin: 3.8 g/dL (ref 3.5–5.0)
Alkaline Phosphatase: 70 U/L (ref 38–126)
Anion gap: 9 (ref 5–15)
BUN: 20 mg/dL (ref 8–23)
CO2: 26 mmol/L (ref 22–32)
Calcium: 9.2 mg/dL (ref 8.9–10.3)
Chloride: 102 mmol/L (ref 98–111)
Creatinine, Ser: 1.01 mg/dL (ref 0.61–1.24)
GFR, Estimated: >60 mL/min (ref >60)
Glucose, Bld: 97 mg/dL (ref 70–99)
Potassium: 4.2 mmol/L (ref 3.5–5.1)
Sodium: 137 mmol/L (ref 135–145)
Total Bilirubin: 1.1 mg/dL (ref 0.3–1.2)
Total Protein: 6.5 g/dL (ref 6.5–8.1)

## 2022-12-12 LAB — MAGNESIUM: Magnesium: 2.2 mg/dL (ref 1.7–2.4)

## 2022-12-12 MED ORDER — SODIUM CHLORIDE 0.9 % IV SOLN: Freq: Once | INTRAVENOUS | Status: AC

## 2022-12-12 MED ORDER — HEPARIN SOD (PORK) LOCK FLUSH 100 UNIT/ML IV SOLN
500.0000 [IU] | Freq: Once | INTRAVENOUS | Status: AC | PRN
  Administered 2022-12-12: 500 [IU]

## 2022-12-12 MED ORDER — CETIRIZINE HCL 10 MG/ML IV SOLN
10.0000 mg | Freq: Once | INTRAVENOUS | Status: AC
  Administered 2022-12-12: 10 mg via INTRAVENOUS
  Filled 2022-12-12: qty 1

## 2022-12-12 MED ORDER — SODIUM CHLORIDE 0.9 % IV SOLN
10.0000 mg | Freq: Once | INTRAVENOUS | Status: AC
  Administered 2022-12-12: 10 mg via INTRAVENOUS
  Filled 2022-12-12: qty 1

## 2022-12-12 MED ORDER — SODIUM CHLORIDE 0.9% FLUSH
10.0000 mL | INTRAVENOUS | Status: DC | PRN
  Administered 2022-12-12: 10 mL

## 2022-12-12 MED ORDER — FAMOTIDINE 20 MG IN NS 100 ML IVPB
20.0000 mg | Freq: Once | INTRAVENOUS | Status: AC
  Administered 2022-12-12: 20 mg via INTRAVENOUS
  Filled 2022-12-12: qty 100

## 2022-12-12 MED ORDER — SODIUM CHLORIDE 0.9 % IV SOLN
15.0000 mg/m2 | Freq: Once | INTRAVENOUS | Status: AC
  Administered 2022-12-12: 27 mg via INTRAVENOUS
  Filled 2022-12-12: qty 2.7

## 2022-12-12 MED ORDER — FAMOTIDINE IN NACL 20-0.9 MG/50ML-% IV SOLN: 20.0000 mg | Freq: Once | INTRAVENOUS | Status: DC

## 2022-12-12 MED ORDER — SODIUM CHLORIDE 0.9% FLUSH
10.0000 mL | Freq: Once | INTRAVENOUS | Status: AC
  Administered 2022-12-12: 10 mL via INTRAVENOUS

## 2022-12-12 MED ORDER — PALONOSETRON HCL INJECTION 0.25 MG/5ML
0.2500 mg | Freq: Once | INTRAVENOUS | Status: AC
  Administered 2022-12-12: 0.25 mg via INTRAVENOUS
  Filled 2022-12-12: qty 5

## 2022-12-12 NOTE — Progress Notes (Signed)
Patient presents today for chemotherapy infusion, Jevtana. Patient is in satisfactory condition with no new complaints voiced.  Vital signs are stable.  Labs reviewed by Dr. Ellin Saba during the office visit and all labs are within treatment parameters.  We will proceed with treatment per MD orders.   Patient c/o blurred vision that started after infusion of pre-meds pepcid, decadron, cetrizine, and Aloxi. Dr Ellin Saba aware, will proceed with treatment per MD. Blurred vision lasted approx 15 minutes.   Patient tolerated treatment well with no complaints voiced.  Patient left ambulatory in stable condition.  Vital signs stable at discharge.  Follow up as scheduled.

## 2022-12-12 NOTE — Patient Instructions (Signed)
[facility] Discharge Instructions   You were seen and examined today by Dr. Katragadda.  He reviewed the results of your lab work which are normal/stable.   We will proceed with your treatment today.  Return as scheduled.    Thank you for choosing [facility] at [facility] to provide your oncology and hematology care.  To afford each patient quality time with our provider, please arrive at least 15 minutes before your scheduled appointment time.   If you have a lab appointment with the Cancer Center please come in thru the Main Entrance and check in at the main information desk.  You need to re-schedule your appointment should you arrive 10 or more minutes late.  We strive to give you quality time with our providers, and arriving late affects you and other patients whose appointments are after yours.  Also, if you no show three or more times for appointments you may be dismissed from the clinic at the providers discretion.     Again, thank you for choosing [facility].  Our hope is that these requests will decrease the amount of time that you wait before being seen by our physicians.       _____________________________________________________________  Should you have questions after your visit to [facility], please contact our office at [phone] and follow the prompts.  Our office hours are 8:00 a.m. and 4:30 p.m. Monday - Friday.  Please note that voicemails left after 4:00 p.m. may not be returned until the following business day.  We are closed weekends and major holidays.  You do have access to a nurse 24-7, just call the main number to the clinic [phone] and do not press any options, hold on the line and a nurse will answer the phone.    For prescription refill requests, have your pharmacy contact our office and allow 72 hours.    Due to Covid, you will need to wear a mask upon entering  the hospital. If you do not have a mask, a mask will be given to you at the Main Entrance upon arrival. For doctor visits, patients may have 1 support person age 18 or older with them. For treatment visits, patients can not have anyone with them due to social distancing guidelines and our immunocompromised population.      

## 2022-12-12 NOTE — Patient Instructions (Signed)
[facility]  Discharge Instructions: Thank you for choosing [facility] to provide your oncology and hematology care.  If you have a lab appointment with the Cancer Center - please note that after April 8th, 2024, all labs will be drawn in the cancer center.  You do not have to check in or register with the main entrance as you have in the past but will complete your check-in in the cancer center.  Wear comfortable clothing and clothing appropriate for easy access to any Portacath or PICC line.   We strive to give you quality time with your provider. You may need to reschedule your appointment if you arrive late (15 or more minutes).  Arriving late affects you and other patients whose appointments are after yours.  Also, if you miss three or more appointments without notifying the office, you may be dismissed from the clinic at the provider's discretion.      For prescription refill requests, have your pharmacy contact our office and allow 72 hours for refills to be completed.    Today you received the following chemotherapy and/or immunotherapy agents Jevtana.  Cabazitaxel Injection What is this medication? CABAZITAXEL (ka BAZ i TAX el) treats prostate cancer. It works by slowing down the growth of cancer cells. This medicine may be used for other purposes; ask your health care provider or pharmacist if you have questions. COMMON BRAND NAME(S): Jevtana What should I tell my care team before I take this medication? They need to know if you have any of these conditions: Kidney problems Liver disease Low white blood cell levels Lung disease Stomach or intestine problems An unusual or allergic reaction to cabazitaxel, polysorbate 80, other medications, foods, dyes, or preservatives Pregnant or trying to get pregnant Breast-feeding How should I use this medication? This medication is injected into a vein. It is given by your care team in a hospital or  clinic setting. Talk to your care team about the use of this medication in children. Special care may be needed. Overdosage: If you think you have taken too much of this medicine contact a poison control center or emergency room at once. NOTE: This medicine is only for you. Do not share this medicine with others. What if I miss a dose? Keep appointments for follow-up doses. It is important not to miss your dose. Call your care team if you are unable to keep an appointment. What may interact with this medication? Certain antibiotics, such as clarithromycin or telithromycin Certain antivirals for HIV or AIDS Certain medications for fungal infections like ketoconazole, itraconazole, and voriconazole Nefazodone This list may not describe all possible interactions. Give your health care provider a list of all the medicines, herbs, non-prescription drugs, or dietary supplements you use. Also tell them if you smoke, drink alcohol, or use illegal drugs. Some items may interact with your medicine. What should I watch for while using this medication? This medication may make you feel generally unwell. This is not uncommon as chemotherapy can affect healthy cells as well as cancer cells. Report any side effects. Continue your course of treatment even though you feel ill unless your care team tells you to stop. You may need blood work while you are taking this medication. This medication may increase your risk of getting an infection. Call your care team for advice if you get a fever, chills, sore throat, or other symptoms of a cold or flu. Do not treat yourself. Try to avoid being around   people who are sick. Avoid taking medications that contain aspirin, acetaminophen, ibuprofen, naproxen, or ketoprofen unless instructed by your care team. These medications may hide a fever. Be careful brushing or flossing your teeth or using a toothpick because you may get an infection or bleed more easily. If you have any  dental work done, tell your dentist you are receiving this medication. This medication can cause serious infusion reactions. To reduce the risk, your care team may give you other medications to take before receiving this one. Be sure to follow the directions from your care team. Use a condom during sex while taking this medication and for 4 months after the last dose. Talk to your care team right away if your partner may be pregnant. This medication can cause serious birth defects. This medication may cause infertility. Talk to your care team if you are concerned about your fertility. What side effects may I notice from receiving this medication? Side effects that you should report to your care team as soon as possible: Allergic reactions--skin rash, itching, hives, swelling of the face, lips, tongue, or throat Diarrhea, nausea, vomiting Dry cough, shortness of breath or trouble breathing Infection--fever, chills, cough, or sore throat Kidney injury--decrease in the amount of urine, swelling of the ankles, hands, or feet Pain, tingling, or numbness in the hands or feet Red or dark brown urine Stomach bleeding--bloody or black, tar-like stools, vomiting blood or brown material that looks like coffee grounds Stomach pain that is severe, does not away, or gets worse Unusual bruising or bleeding Side effects that usually do not require medical attention (report these to your care team if they continue or are bothersome): Loss of appetite Unusual weakness or fatigue This list may not describe all possible side effects. Call your doctor for medical advice about side effects. You may report side effects to FDA at 1-800-FDA-1088. Where should I keep my medication? This medication is given in a hospital or clinic. It will not be stored at home. NOTE: This sheet is a summary. It may not cover all possible information. If you have questions about this medicine, talk to your doctor, pharmacist, or health  care provider.  2023 Elsevier/Gold Standard (2009-02-03 00:00:00)        To help prevent nausea and vomiting after your treatment, we encourage you to take your nausea medication as directed.  BELOW ARE SYMPTOMS THAT SHOULD BE REPORTED IMMEDIATELY: *FEVER GREATER THAN 100.4 F (38 C) OR HIGHER *CHILLS OR SWEATING *NAUSEA AND VOMITING THAT IS NOT CONTROLLED WITH YOUR NAUSEA MEDICATION *UNUSUAL SHORTNESS OF BREATH *UNUSUAL BRUISING OR BLEEDING *URINARY PROBLEMS (pain or burning when urinating, or frequent urination) *BOWEL PROBLEMS (unusual diarrhea, constipation, pain near the anus) TENDERNESS IN MOUTH AND THROAT WITH OR WITHOUT PRESENCE OF ULCERS (sore throat, sores in mouth, or a toothache) UNUSUAL RASH, SWELLING OR PAIN  UNUSUAL VAGINAL DISCHARGE OR ITCHING   Items with * indicate a potential emergency and should be followed up as soon as possible or go to the Emergency Department if any problems should occur.  Please show the CHEMOTHERAPY ALERT CARD or IMMUNOTHERAPY ALERT CARD at check-in to the Emergency Department and triage nurse.  Should you have questions after your visit or need to cancel or reschedule your appointment, please contact [facility] [phone]  and follow the prompts.  Office hours are 8:00 a.m. to 4:30 p.m. Monday - Friday. Please note that voicemails left after 4:00 p.m. may not be returned until the following business   day.  We are closed weekends and major holidays. You have access to a nurse at all times for urgent questions. Please call the main number to the clinic [phone] and follow the prompts.  For any non-urgent questions, you may also contact your provider using MyChart. We now offer e-Visits for anyone 18 and older to request care online for non-urgent symptoms. For details visit [email or web address].   Also download the MyChart app! Go to the app store, search "MyChart", open the app, select [facility], and log in with  your MyChart username and password.   

## 2022-12-12 NOTE — Progress Notes (Signed)
[facility] [address], [place] [address]    Clinic Day:  12/12/2022  Referring physician: Doreatha Massed, MD  Patient Care Team: Patient, No Pcp Per as PCP - General (General Practice) Doreatha Massed, MD as Consulting Physician (Oncology) Mickie Bail, RN as Oncology Nurse Navigator (Oncology)   ASSESSMENT & PLAN:   Assessment: 1.  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 -External beam radiation with 2 years of Lupron from 05/27/2013 through 07/28/2013.  Received Lupron until 09/28/2014. -Rising PSA levels in December 2018, of 2.8.  Local recurrence was confirmed with biopsy. -Bone scan on 04/12/2018 did not show any evidence of metastatic disease. -He was evaluated by Dr. Greggory Stallion at [facility].  Bone scan and CT scan at [facility] on 07/02/2018 did not show any evidence of metastatic disease.  Left para-aortic lymph node measures 8 mm.  Lupron started for nonmetastatic HSPC in November 2019 for rapid PSA doubling time of less than 3 months.  He had difficulty tolerating Lupron with irritability, low energy and fatigue. -PSA 5.7 (07/08/2019), PSA 11.1 (11/26/2019) with testosterone 18. -Fluciclovine PET CT scan on 12/23/2019 showed T11 meta stasis.  Asymmetric hypermetabolism within the prostate suspicious for residual disease.  Mild enlargement of 2 adjacent lower left para-aortic nodes, largest 8 mm with SUV of 3.4.  These nodes measured maximally 5 mm on PET scan on 02/25/2018. -Abiraterone 500 mg daily and prednisone from 01/03/2020 through 02/16/2020, discontinued secondary to elevated LFTs. -We will consider checking germline and somatic mutation testing. -Lupron 45 mg on 02/17/2020 at Dr. Lenoria Chime office. -Enzalutamide from 03/10/2020 through 10/17/2022 with progression. - Guardant360: MSI high not detected. NOTCH1 mutation. - Recommended cabazitaxel to minimize  incidence of neuropathy as he is a Technical sales engineer and plays guitar. - Cycle 1 cabazitaxel on 11/21/2022    Plan: 1.  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 - He did not have any major GI side effects.  However he reported chest pains and body pains which lasted 4 to 5 days after G-CSF injection. - He also stopped prednisone as it was worsening his anxiety.  No tingling or numbness in the extremities reported. - He lost about 2 and half pounds but his appetite is good since last treatment. - Reviewed labs today: Normal LFTs and creatinine.  CBC was grossly normal.  PSA is pending. - We will proceed with cycle 2 at low-dose of 15 mg/m.  If he tolerates well, will consider increasing it to 20 mg/m. - We had a prolonged discussion and decided to discontinue G-CSF with cycle 2 because of body pains.  In the event he has a fever of 100.5, he was recommended to go to the ER.  RTC 3 weeks for follow-up.   2.  Bone metastasis: - Continue calcium and vitamin D supplements.   3.  Heterozygosity for MUTYH mutation: - Cologuard test was refused previously.   4.  Hypertension: - Continue losartan 25 mg daily.  Blood pressure is 150/70.   5.  Fatigue: - He could not tolerate low-dose prednisone which was discontinued.  Use Ritalin as needed.    No orders of the defined types were placed in this encounter.     I,Katie Daubenspeck,acting as a Neurosurgeon for Doreatha Massed, MD.,have documented all relevant documentation on the behalf of Doreatha Massed, MD,as directed by  Doreatha Massed, MD while in the presence of Doreatha Massed, MD.   I, Doreatha Massed MD, have reviewed the above documentation for accuracy and completeness, and I agree with the above.   Doreatha Massed, MD   5/7/202412:22 PM  CHIEF COMPLAINT:   Diagnosis: castration resistant prostate cancer    Cancer Staging  Prostate cancer  [facility]) Staging form: Prostate, AJCC 7th Edition - Clinical stage from 12/26/2019: Stage IV (yTX, N1, M1b, PSA: 10 to 19, Gleason 8-10) - Signed by Doreatha Massed, MD on 12/26/2019    Prior Therapy: 1. External beam radiation from 05/27/2013 to 07/28/2013  2. enzalutamide 80 mg QD   Current Therapy:  cabazitaxel; Lupron every 6 months   HISTORY OF PRESENT ILLNESS:   Oncology History  Prostate cancer (HCC)  09/24/2012 Initial Diagnosis   Prostate cancer (HCC)   12/26/2019 Cancer Staging   Staging form: Prostate, AJCC 7th Edition - Clinical stage from 12/26/2019: Stage IV (yTX, N1, M1b, PSA: 10 to 19, Gleason 8-10) - Signed by Doreatha Massed, MD on 12/26/2019   08/24/2020 Genetic Testing   Positive genetic testing:  A single, 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 Testing was completed through the Common Hereditary Cancers panel and Prostate Cancer HRR panel offered by [facility] laboratories. The report date is 08/24/2020.   The Common Hereditary Cancers Panel offered by [facility] includes sequencing and/or deletion duplication testing of the following 47 genes: APC, ATM, AXIN2, BARD1, BMPR1A, BRCA1, BRCA2, BRIP1, CDH1, CDK4, CDKN2A (p14ARF), CDKN2A (p16INK4a), CHEK2, CTNNA1, DICER1, EPCAM (Deletion/duplication testing only), GREM1 (promoter region deletion/duplication testing only), KIT, MEN1, MLH1, MSH2, MSH3, MSH6, MUTYH, NBN, NF1, NTHL1, PALB2, PDGFRA, PMS2, POLD1, POLE, PTEN, RAD50, RAD51C, RAD51D, SDHB, SDHC, SDHD, SMAD4, SMARCA4. STK11, TP53, TSC1, TSC2, and VHL.  The following genes were evaluated for sequence changes only: SDHA and HOXB13 c.251G>A variant only. The Prostate Cancer HRR Panel offered by [facility] includes sequencing and/or deletion/duplication analysis of the following 10 genes: ATM, BARD1, BRCA1, BRCA2, BRIP1, CHEK2, FANCL, PALB2, RAD51C, and RAD51D.   11/21/2022 -  Chemotherapy   Patient is on Treatment Plan : PROSTATE  Cabazitaxel (20) D1 + Prednisone D1-21 q21d        INTERVAL HISTORY:   Nathan Smith is a 72 y.o. male presenting to clinic today for follow up of castration resistant prostate cancer. He was last seen by me on 11/02/22. He was also seen by PA Rebekah on 11/28/22 in our symptom management clinic.  Since his last visit, he had port placed on 11/16/22 and began cabazitaxel on 11/21/22.  Today, he states that he is doing well overall. His appetite level is at 100%. His energy level is at 100%.  PAST MEDICAL HISTORY:   Past Medical History: Past Medical History:  Diagnosis Date   Anxiety    Bilateral hydronephrosis    Bladder incontinence    night time,  past 2 months   BPH (benign prostatic hypertrophy) with urinary obstruction    Depression    ED (erectile dysfunction)    H/O ascites    trace   Hypertension    Port-A-Cath in place 11/13/2022   Prostate cancer (HCC) 09/24/2012   gleason 4+3=7., & 4+4=8,PSA=67.30, volume=33.7cc    Surgical History: Past Surgical History:  Procedure Laterality Date   IR IMAGING GUIDED PORT INSERTION  11/16/2022   PROSTATE BIOPSY  09/24/12   Adenocarcinoma   PROSTATE BIOPSY N/A 12/20/2017   Procedure: BIOPSY TRANSRECTAL ULTRASONIC PROSTATE (TUBP);  Surgeon: Marcine Matar, MD;  Location: [facility];  Service: Urology;  Laterality: N/A;   TONSILLECTOMY      Social History: Social History   Socioeconomic History   Marital status: Single    Spouse name: Not on file   Number of children: 0   Years of education: Not on file   Highest education level: Not on file  Occupational History   Occupation: [name]    Employer: [employer]  Tobacco Use   Smoking status: Never   Smokeless tobacco: Never  Vaping Use   Vaping Use: Never used  Substance and Sexual Activity   Alcohol use: Yes    Alcohol/week: 2.0 standard drinks of alcohol    Types: 2 Cans of beer per week    Comment: 3 per day, beer/wine , hx etoh abuse years ago   Drug  use: No    Comment: hx   Sexual activity: Yes    Birth control/protection: Condom  Other Topics Concern   Not on file  Social History Narrative   Not on file   Social Determinants of Health   Financial Resource Strain: Low Risk  (12/08/2019)   Overall Financial Resource Strain (CARDIA)    Difficulty of Paying Living Expenses: Not very hard  Food Insecurity: No Food Insecurity (12/08/2019)   Hunger Vital Sign    Worried About Running Out of Food in the Last Year: Never true    Ran Out of Food in the Last Year: Never true  Transportation Needs: No Transportation Needs (12/08/2019)   PRAPARE - Administrator, Civil Service (Medical): No    Lack of Transportation (Non-Medical): No  Physical Activity: Sufficiently Active (12/08/2019)   Exercise Vital Sign    Days of Exercise per Week: 6 days    Minutes of Exercise per Session: 60 min  Stress: Stress Concern Present (12/08/2019)   [employer] of Occupational Health - Occupational Stress Questionnaire    Feeling of Stress : To some extent  Social Connections: Unknown (12/08/2019)   Social Connection and Isolation Panel [NHANES]    Frequency of Communication with Friends and Family: Twice a week    Frequency of Social Gatherings with Friends and Family: Once a week    Attends Religious Services: More than 4 times per year    Active Member of [employer] or Organizations: No    Attends Engineer, structural: More than 4 times per year    Marital Status: Not on file  Intimate Partner Violence: Not At Risk (12/08/2019)   Humiliation, Afraid, Rape, and Kick questionnaire    Fear of Current or Ex-Partner: No    Emotionally Abused: No    Physically Abused: No    Sexually Abused: No    Family History: Family History  Problem Relation Age of Onset   Depression Mother    Dementia Mother    Anxiety disorder Mother    Prostate cancer Father        unconfirmed diagnosis    Current Medications:  Current Outpatient  Medications:    Ascorbic Acid (VITAMIN C) 1000 MG tablet, Take 1,000 mg by mouth daily., Disp: , Rfl:    b complex vitamins capsule, Take 1 capsule by mouth daily., Disp: , Rfl:    Bacillus Coagulans-Inulin (PROBIOTIC) 1-250 BILLION-MG CAPS, Take by mouth daily. , Disp: , Rfl:    Cabazitaxel (JEVTANA IV), Inject into the vein every 21 ( twenty-one) days., Disp: , Rfl:    Cholecalciferol 125 MCG (5000 UT) capsule, Take 5,000  Units by mouth daily. , Disp: , Rfl:    Ginseng 100 MG CAPS, Take by mouth daily. , Disp: , Rfl:    L-ARGININE PO, Take 1,000 mg by mouth daily. , Disp: , Rfl:    lidocaine-prilocaine (EMLA) cream, Apply a small amount to port a cath site and cover with plastic wrap one hour prior to infusion appointments, Disp: 30 g, Rfl: 3   losartan (COZAAR) 25 MG tablet, TAKE 1 TABLET(25 MG) BY MOUTH DAILY, Disp: 90 tablet, Rfl: 3   melatonin 1 MG TABS tablet, Take 2 mg by mouth at bedtime. , Disp: , Rfl:    Probiotic Product (ALIGN) 4 MG CAPS, Take 1 capsule by mouth daily. , Disp: , Rfl:    prochlorperazine (COMPAZINE) 10 MG tablet, Take 1 tablet (10 mg total) by mouth every 6 (six) hours as needed for nausea or vomiting., Disp: 60 tablet, Rfl: 1   tamsulosin (FLOMAX) 0.4 MG CAPS capsule, TAKE 1 CAPSULE(0.4 MG) BY MOUTH DAILY, Disp: 90 capsule, Rfl: 3   zinc gluconate 50 MG tablet, Take 50 mg by mouth daily., Disp: , Rfl:    methylphenidate (RITALIN) 5 MG tablet, Take 0.5 tablets (2.5 mg total) by mouth daily as needed. (Patient not taking: Reported on 12/12/2022), Disp: 10 tablet, Rfl: 0 No current facility-administered medications for this visit.  Facility-Administered Medications Ordered in Other Visits:    cabazitaxel (JEVTANA) 27 mg in sodium chloride 0.9 % 250 mL chemo infusion, 15 mg/m2 (Treatment Plan Recorded), Intravenous, Once, Doreatha Massed, MD   cetirizine (QUZYTTIR) injection 10 mg, 10 mg, Intravenous, Once, Doreatha Massed, MD   dexamethasone (DECADRON) 10 mg in  sodium chloride 0.9 % 50 mL IVPB, 10 mg, Intravenous, Once, Doreatha Massed, MD   famotidine (PEPCID) IVPB 20 mg in NS 100 mL IVPB, 20 mg, Intravenous, Once, Doreatha Massed, MD   fulvestrant (FASLODEX) 250 MG/5ML injection, , , ,    heparin lock flush 100 unit/mL, 500 Units, Intracatheter, Once PRN, Doreatha Massed, MD   palonosetron (ALOXI) injection 0.25 mg, 0.25 mg, Intravenous, Once, Doreatha Massed, MD   sodium chloride flush (NS) 0.9 % injection 10 mL, 10 mL, Intracatheter, PRN, Doreatha Massed, MD   Allergies: No Known Allergies  REVIEW OF SYSTEMS:   Review of Systems  Constitutional:  Negative for chills, fatigue and fever.  HENT:   Negative for lump/mass, mouth sores, nosebleeds, sore throat and trouble swallowing.   Eyes:  Negative for eye problems.  Respiratory:  Negative for cough and shortness of breath.   Cardiovascular:  Negative for chest pain, leg swelling and palpitations.  Gastrointestinal:  Positive for constipation and diarrhea. Negative for abdominal pain, nausea and vomiting.  Genitourinary:  Negative for bladder incontinence, difficulty urinating, dysuria, frequency, hematuria and nocturia.   Musculoskeletal:  Negative for arthralgias, back pain, flank pain, myalgias and neck pain.  Skin:  Negative for itching and rash.  Neurological:  Negative for dizziness, headaches and numbness.  Hematological:  Does not bruise/bleed easily.  Psychiatric/Behavioral:  Positive for depression. Negative for sleep disturbance and suicidal ideas. The patient is nervous/anxious.   All other systems reviewed and are negative.    VITALS:   Blood pressure 117/86, pulse 78, temperature 97.6 F (36.4 C), temperature source Oral, resp. rate 16, weight 142 lb (64.4 kg), SpO2 100 %.  Wt Readings from Last 3 Encounters:  12/12/22 142 lb (64.4 kg)  11/28/22 145 lb 12.8 oz (66.1 kg)  11/23/22 144 lb 3.2 oz (65.4 kg)  Body mass index is 19.26  kg/m.  Performance status (ECOG): 1 - Symptomatic but completely ambulatory  PHYSICAL EXAM:   Physical Exam Vitals and nursing note reviewed. Exam conducted with a chaperone present.  Constitutional:      Appearance: Normal appearance.  Cardiovascular:     Rate and Rhythm: Normal rate and regular rhythm.     Pulses: Normal pulses.     Heart sounds: Normal heart sounds.  Pulmonary:     Effort: Pulmonary effort is normal.     Breath sounds: Normal breath sounds.  Abdominal:     Palpations: Abdomen is soft. There is no hepatomegaly, splenomegaly or mass.     Tenderness: There is no abdominal tenderness.  Musculoskeletal:     Right lower leg: No edema.     Left lower leg: No edema.  Lymphadenopathy:     Cervical: No cervical adenopathy.     Right cervical: No superficial, deep or posterior cervical adenopathy.    Left cervical: No superficial, deep or posterior cervical adenopathy.     Upper Body:     Right upper body: No supraclavicular or axillary adenopathy.     Left upper body: No supraclavicular or axillary adenopathy.  Neurological:     General: No focal deficit present.     Mental Status: He is alert and oriented to person, place, and time.  Psychiatric:        Mood and Affect: Mood normal.        Behavior: Behavior normal.     LABS:      Latest Ref Rng & Units 12/12/2022   11:19 AM 11/28/2022    9:39 AM 11/21/2022   10:00 AM  CBC  WBC 4.0 - 10.5 K/uL 7.1  22.7  5.0   Hemoglobin 13.0 - 17.0 g/dL 16.1  09.6  04.5   Hematocrit 39.0 - 52.0 % 42.2  41.8  43.5   Platelets 150 - 400 K/uL 380  224  240       Latest Ref Rng & Units 12/12/2022   11:19 AM 11/28/2022    9:39 AM 11/21/2022   10:00 AM  CMP  Glucose 70 - 99 mg/dL 97  409  65   BUN 8 - 23 mg/dL 20  13  14    Creatinine 0.61 - 1.24 mg/dL 8.11  9.14  7.82   Sodium 135 - 145 mmol/L 137  136  135   Potassium 3.5 - 5.1 mmol/L 4.2  3.9  4.2   Chloride 98 - 111 mmol/L 102  101  102   CO2 22 - 32 mmol/L 26  25  29     Calcium 8.9 - 10.3 mg/dL 9.2  8.9  8.8   Total Protein 6.5 - 8.1 g/dL 6.5  6.3  6.7   Total Bilirubin 0.3 - 1.2 mg/dL 1.1  0.5  0.4   Alkaline Phos 38 - 126 U/L 70  124  64   AST 15 - 41 U/L 20  21  20    ALT 0 - 44 U/L 14  12  13       No results found for: "CEA1", "CEA" / No results found for: "CEA1", "CEA" Lab Results  Component Value Date   PSA1 0.3 08/30/2021   No results found for: "NFA213" No results found for: "CAN125"  No results found for: "TOTALPROTELP", "ALBUMINELP", "A1GS", "A2GS", "BETS", "BETA2SER", "GAMS", "MSPIKE", "SPEI" No results found for: "TIBC", "FERRITIN", "IRONPCTSAT" Lab Results  Component Value Date   LDH 129 03/22/2020  STUDIES:   IR IMAGING GUIDED PORT INSERTION  Result Date: 11/16/2022 INDICATION: Prostate cancer. EXAM: IMPLANTED PORT A CATH PLACEMENT WITH ULTRASOUND AND FLUOROSCOPIC GUIDANCE MEDICATIONS: None ANESTHESIA/SEDATION: Local anesthetic and single agent sedation was employed during this procedure. A total of Versed 3 mg was administered intravenously. The patient's level of consciousness and vital signs were monitored continuously by radiology nursing throughout the procedure under my direct supervision. FLUOROSCOPY TIME:  Fluoroscopic dose; 1 mGy COMPLICATIONS: None immediate. PROCEDURE: The procedure, risks, benefits, and alternatives were explained to the patient. Questions regarding the procedure were encouraged and answered. The patient understands and consents to the procedure. The RIGHT neck and chest were prepped with chlorhexidine in a sterile fashion, and a sterile drape was applied covering the operative field. Maximum barrier sterile technique with sterile gowns and gloves were used for the procedure. A timeout was performed prior to the initiation of the procedure. Local anesthesia was provided with 1% lidocaine with epinephrine. After creating a small venotomy incision, a micropuncture kit was utilized to access the internal  jugular vein under direct, real-time ultrasound guidance. Ultrasound image documentation was performed. The microwire was kinked to measure appropriate catheter length. A subcutaneous port pocket was then created along the upper chest wall utilizing a combination of sharp and blunt dissection. The pocket was irrigated with sterile saline. A single lumen ISP power injectable port was chosen for placement. The 8 Fr catheter was tunneled from the port pocket site to the venotomy incision. The port was placed in the pocket. The external catheter was trimmed to appropriate length. At the venotomy, an 8 Fr peel-away sheath was placed over a guidewire under fluoroscopic guidance. The catheter was then placed through the sheath and the sheath was removed. Final catheter positioning was confirmed and documented with a fluoroscopic spot radiograph. The port was accessed with a Huber needle, aspirated and flushed with heparinized saline. The port pocket incision was closed with interrupted 3-0 Vicryl suture then Dermabond was applied, including at the venotomy incision. Dressings were placed. The patient tolerated the procedure well without immediate post procedural complication. IMPRESSION: Successful placement of a RIGHT internal jugular approach power injectable Port-A-Cath. The tip of the catheter is positioned at the superior cavo-atrial junction. The catheter is ready for immediate use. Roanna Banning, MD [facility] Electronically Signed   By: Roanna Banning M.D.   On: 11/16/2022 16:12

## 2022-12-14 ENCOUNTER — Inpatient Hospital Stay: Payer: Medicare Other

## 2023-01-03 NOTE — Progress Notes (Signed)
[facility] [address] [place] [address]    Clinic Day:  01/04/2023  Referring physician: Doreatha Massed, MD  Patient Care Team: Patient, No Pcp Per as PCP - General (General Practice) Doreatha Massed, MD as Consulting Physician (Oncology) Mickie Bail, RN as Oncology Nurse Navigator (Oncology)   ASSESSMENT & PLAN:   Assessment: 1.  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 -External beam radiation with 2 years of Lupron from 05/27/2013 through 07/28/2013.  Received Lupron until 09/28/2014. -Rising PSA levels in December 2018, of 2.8.  Local recurrence was confirmed with biopsy. -Bone scan on 04/12/2018 did not show any evidence of metastatic disease. -He was evaluated by Dr. Greggory Stallion at [facility].  Bone scan and CT scan at [facility] on 07/02/2018 did not show any evidence of metastatic disease.  Left para-aortic lymph node measures 8 mm.  Lupron started for nonmetastatic HSPC in November 2019 for rapid PSA doubling time of less than 3 months.  He had difficulty tolerating Lupron with irritability, low energy and fatigue. -PSA 5.7 (07/08/2019), PSA 11.1 (11/26/2019) with testosterone 18. -Fluciclovine PET CT scan on 12/23/2019 showed T11 meta stasis.  Asymmetric hypermetabolism within the prostate suspicious for residual disease.  Mild enlargement of 2 adjacent lower left para-aortic nodes, largest 8 mm with SUV of 3.4.  These nodes measured maximally 5 mm on PET scan on 02/25/2018. -Abiraterone 500 mg daily and prednisone from 01/03/2020 through 02/16/2020, discontinued secondary to elevated LFTs. -We will consider checking germline and somatic mutation testing. -Lupron 45 mg on 02/17/2020 at Dr. Lenoria Chime office. -Enzalutamide from 03/10/2020 through 10/17/2022 with progression. - Guardant360: MSI high not detected. NOTCH1 mutation. - Recommended cabazitaxel to minimize  incidence of neuropathy as he is a Technical sales engineer and plays guitar. - Cycle 1 cabazitaxel on 11/21/2022  2.  Heterozygosity for MUTYH mutation: - Cologuard test was refused previously.    Plan: 1.  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  He did not receive G-CSF which helped with bone pains. - His fatigue is mostly stable. - Reviewed labs today: Normal LFTs and creatinine.  CBC grossly normal.  PSA on 12/12/2022 was 4.02. - It is not uncommon for the PSA to go up after 1-2 cycles.  Will closely monitor.  If continues to go up, will consider increasing dose to 20 mg/m. - Proceed with cycle 3 today with 15 mg/m due to fatigue.  No G-CSF.  RTC 3 weeks for follow-up.   2.  Bone metastasis: - Continue calcium and vitamin D supplements.  Denosumab refused previously.   3.  Hypertension: - Continue losartan 25 mg daily.  Blood pressure is well-controlled.   6.  Cancer-related fatigue: - He could not tolerate low-dose prednisone which was discontinued. - He takes tiny bit of Ritalin tablet on Sundays.  He is drinking 5 Hour energy occasionally. - Recommend American ginseng 2 g/day.    No orders of the defined types were placed in this encounter.     I,Katie Daubenspeck,acting as a Neurosurgeon for Doreatha Massed, MD.,have documented all relevant documentation on the behalf of Doreatha Massed, MD,as directed by  Doreatha Massed, MD while in the presence of Doreatha Massed, MD.   I, Doreatha Massed MD, have reviewed the above documentation for accuracy and completeness, and I agree with the above.  Doreatha Massed, MD   5/30/20246:19 PM  CHIEF COMPLAINT:   Diagnosis: castration resistant prostate cancer    Cancer Staging  Prostate cancer [facility]) Staging form: Prostate, AJCC 7th Edition - Clinical stage from 12/26/2019: Stage IV (yTX, N1, M1b, PSA: 10 to 19, Gleason 8-10) - Signed by  Doreatha Massed, MD on 12/26/2019    Prior Therapy: 1. XRT from 05/27/2013 - 07/28/2013  2. Lupron 03/2013 - 09/28/14 3. Abiraterone 12/24/19 - 02/16/20 4. enzalutamide 80 mg QD 03/10/20 - 10/17/22  Current Therapy:  cabazitaxel; Lupron every 6 months    HISTORY OF PRESENT ILLNESS:   Oncology History  Prostate cancer (HCC)  09/24/2012 Initial Diagnosis   Prostate cancer (HCC)   12/26/2019 Cancer Staging   Staging form: Prostate, AJCC 7th Edition - Clinical stage from 12/26/2019: Stage IV (yTX, N1, M1b, PSA: 10 to 19, Gleason 8-10) - Signed by Doreatha Massed, MD on 12/26/2019   08/24/2020 Genetic Testing   Positive genetic testing:  A single, 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 Testing was completed through the Common Hereditary Cancers panel and Prostate Cancer HRR panel offered by [facility] laboratories. The report date is 08/24/2020.   The Common Hereditary Cancers Panel offered by [facility] includes sequencing and/or deletion duplication testing of the following 47 genes: APC, ATM, AXIN2, BARD1, BMPR1A, BRCA1, BRCA2, BRIP1, CDH1, CDK4, CDKN2A (p14ARF), CDKN2A (p16INK4a), CHEK2, CTNNA1, DICER1, EPCAM (Deletion/duplication testing only), GREM1 (promoter region deletion/duplication testing only), KIT, MEN1, MLH1, MSH2, MSH3, MSH6, MUTYH, NBN, NF1, NTHL1, PALB2, PDGFRA, PMS2, POLD1, POLE, PTEN, RAD50, RAD51C, RAD51D, SDHB, SDHC, SDHD, SMAD4, SMARCA4. STK11, TP53, TSC1, TSC2, and VHL.  The following genes were evaluated for sequence changes only: SDHA and HOXB13 c.251G>A variant only. The Prostate Cancer HRR Panel offered by [facility] includes sequencing and/or deletion/duplication analysis of the following 10 genes: ATM, BARD1, BRCA1, BRCA2, BRIP1, CHEK2, FANCL, PALB2, RAD51C, and RAD51D.   11/21/2022 -  Chemotherapy   Patient is on Treatment Plan : PROSTATE Cabazitaxel (20) D1 + Prednisone D1-21 q21d        INTERVAL HISTORY:   Nathan Smith is a 72 y.o.  male presenting to clinic today for follow up of castration resistant prostate cancer . He was last seen by me on 12/12/22.  Today, he states that he is doing well overall. His appetite level is at 100%. His energy level is at 65%.  PAST MEDICAL HISTORY:   Past Medical History: Past Medical History:  Diagnosis Date   Anxiety    Bilateral hydronephrosis    Bladder incontinence    night time,  past 2 months   BPH (benign prostatic hypertrophy) with urinary obstruction    Depression    ED (erectile dysfunction)    H/O ascites    trace   Hypertension    Port-A-Cath in place 11/13/2022   Prostate cancer (HCC) 09/24/2012   gleason 4+3=7., & 4+4=8,PSA=67.30, volume=33.7cc    Surgical History: Past Surgical History:  Procedure Laterality Date   IR IMAGING GUIDED PORT INSERTION  11/16/2022   PROSTATE BIOPSY  09/24/12   Adenocarcinoma   PROSTATE BIOPSY N/A 12/20/2017   Procedure: BIOPSY TRANSRECTAL ULTRASONIC PROSTATE (TUBP);  Surgeon: Marcine Matar, MD;  Location: [facility];  Service: Urology;  Laterality: N/A;   TONSILLECTOMY      Social History: Social History   Socioeconomic History   Marital status: Single    Spouse name: Not on file   Number of children: 0   Years of education: Not on file  Highest education level: Not on file  Occupational History   Occupation: [name]    Employer: [employer]  Tobacco Use   Smoking status: Never   Smokeless tobacco: Never  Vaping Use   Vaping Use: Never used  Substance and Sexual Activity   Alcohol use: Yes    Alcohol/week: 2.0 standard drinks of alcohol    Types: 2 Cans of beer per week    Comment: 3 per day, beer/wine , hx etoh abuse years ago   Drug use: No    Comment: hx   Sexual activity: Yes    Birth control/protection: Condom  Other Topics Concern   Not on file  Social History Narrative   Not on file   Social Determinants of Health   Financial Resource Strain: Low Risk  (12/08/2019)   Overall  Financial Resource Strain (CARDIA)    Difficulty of Paying Living Expenses: Not very hard  Food Insecurity: No Food Insecurity (12/08/2019)   Hunger Vital Sign    Worried About Running Out of Food in the Last Year: Never true    Ran Out of Food in the Last Year: Never true  Transportation Needs: No Transportation Needs (12/08/2019)   PRAPARE - Administrator, Civil Service (Medical): No    Lack of Transportation (Non-Medical): No  Physical Activity: Sufficiently Active (12/08/2019)   Exercise Vital Sign    Days of Exercise per Week: 6 days    Minutes of Exercise per Session: 60 min  Stress: Stress Concern Present (12/08/2019)   [employer] of Occupational Health - Occupational Stress Questionnaire    Feeling of Stress : To some extent  Social Connections: Unknown (12/08/2019)   Social Connection and Isolation Panel [NHANES]    Frequency of Communication with Friends and Family: Twice a week    Frequency of Social Gatherings with Friends and Family: Once a week    Attends Religious Services: More than 4 times per year    Active Member of [employer] or Organizations: No    Attends Engineer, structural: More than 4 times per year    Marital Status: Not on file  Intimate Partner Violence: Not At Risk (12/08/2019)   Humiliation, Afraid, Rape, and Kick questionnaire    Fear of Current or Ex-Partner: No    Emotionally Abused: No    Physically Abused: No    Sexually Abused: No    Family History: Family History  Problem Relation Age of Onset   Depression Mother    Dementia Mother    Anxiety disorder Mother    Prostate cancer Father        unconfirmed diagnosis    Current Medications:  Current Outpatient Medications:    Ascorbic Acid (VITAMIN C) 1000 MG tablet, Take 1,000 mg by mouth daily., Disp: , Rfl:    b complex vitamins capsule, Take 1 capsule by mouth daily., Disp: , Rfl:    Bacillus Coagulans-Inulin (PROBIOTIC) 1-250 BILLION-MG CAPS, Take by mouth daily. , Disp:  , Rfl:    Cabazitaxel (JEVTANA IV), Inject into the vein every 21 ( twenty-one) days., Disp: , Rfl:    Cholecalciferol 125 MCG (5000 UT) capsule, Take 5,000 Units by mouth daily. , Disp: , Rfl:    Ginseng 100 MG CAPS, Take by mouth daily. , Disp: , Rfl:    L-ARGININE PO, Take 1,000 mg by mouth daily. , Disp: , Rfl:    lidocaine-prilocaine (EMLA) cream, Apply a small amount to port a cath site and  cover with plastic wrap one hour prior to infusion appointments, Disp: 30 g, Rfl: 3   losartan (COZAAR) 25 MG tablet, TAKE 1 TABLET(25 MG) BY MOUTH DAILY, Disp: 90 tablet, Rfl: 3   melatonin 1 MG TABS tablet, Take 2 mg by mouth at bedtime. , Disp: , Rfl:    methylphenidate (RITALIN) 5 MG tablet, Take 0.5 tablets (2.5 mg total) by mouth daily as needed., Disp: 10 tablet, Rfl: 0   Probiotic Product (ALIGN) 4 MG CAPS, Take 1 capsule by mouth daily. , Disp: , Rfl:    prochlorperazine (COMPAZINE) 10 MG tablet, Take 1 tablet (10 mg total) by mouth every 6 (six) hours as needed for nausea or vomiting., Disp: 60 tablet, Rfl: 1   tamsulosin (FLOMAX) 0.4 MG CAPS capsule, TAKE 1 CAPSULE(0.4 MG) BY MOUTH DAILY, Disp: 90 capsule, Rfl: 3   zinc gluconate 50 MG tablet, Take 50 mg by mouth daily., Disp: , Rfl:  No current facility-administered medications for this visit.  Facility-Administered Medications Ordered in Other Visits:    fulvestrant (FASLODEX) 250 MG/5ML injection, , , ,    sodium chloride flush (NS) 0.9 % injection 10 mL, 10 mL, Intracatheter, PRN, Doreatha Massed, MD, 10 mL at 01/04/23 1412   Allergies: No Known Allergies  REVIEW OF SYSTEMS:   Review of Systems  Constitutional:  Negative for chills, fatigue and fever.  HENT:   Negative for lump/mass, mouth sores, nosebleeds, sore throat and trouble swallowing.   Eyes:  Negative for eye problems.  Respiratory:  Negative for cough and shortness of breath.   Cardiovascular:  Negative for chest pain, leg swelling and palpitations.   Gastrointestinal:  Positive for nausea. Negative for abdominal pain, constipation, diarrhea and vomiting.  Genitourinary:  Negative for bladder incontinence, difficulty urinating, dysuria, frequency, hematuria and nocturia.   Musculoskeletal:  Negative for arthralgias, back pain, flank pain, myalgias and neck pain.  Skin:  Negative for itching and rash.  Neurological:  Positive for dizziness. Negative for headaches and numbness.  Hematological:  Does not bruise/bleed easily.  Psychiatric/Behavioral:  Negative for depression, sleep disturbance and suicidal ideas. The patient is nervous/anxious.   All other systems reviewed and are negative.    VITALS:   There were no vitals taken for this visit.  Wt Readings from Last 3 Encounters:  01/04/23 144 lb 9.6 oz (65.6 kg)  12/12/22 142 lb (64.4 kg)  11/28/22 145 lb 12.8 oz (66.1 kg)    There is no height or weight on file to calculate BMI.  Performance status (ECOG): 1 - Symptomatic but completely ambulatory  PHYSICAL EXAM:   Physical Exam Vitals and nursing note reviewed. Exam conducted with a chaperone present.  Constitutional:      Appearance: Normal appearance.  Cardiovascular:     Rate and Rhythm: Normal rate and regular rhythm.     Pulses: Normal pulses.     Heart sounds: Normal heart sounds.  Pulmonary:     Effort: Pulmonary effort is normal.     Breath sounds: Normal breath sounds.  Abdominal:     Palpations: Abdomen is soft. There is no hepatomegaly, splenomegaly or mass.     Tenderness: There is no abdominal tenderness.  Musculoskeletal:     Right lower leg: No edema.     Left lower leg: No edema.  Lymphadenopathy:     Cervical: No cervical adenopathy.     Right cervical: No superficial, deep or posterior cervical adenopathy.    Left cervical: No superficial, deep or posterior cervical  adenopathy.     Upper Body:     Right upper body: No supraclavicular or axillary adenopathy.     Left upper body: No supraclavicular  or axillary adenopathy.  Neurological:     General: No focal deficit present.     Mental Status: He is alert and oriented to person, place, and time.  Psychiatric:        Mood and Affect: Mood normal.        Behavior: Behavior normal.     LABS:      Latest Ref Rng & Units 01/04/2023   10:00 AM 12/12/2022   11:19 AM 11/28/2022    9:39 AM  CBC  WBC 4.0 - 10.5 K/uL 4.1  7.1  22.7   Hemoglobin 13.0 - 17.0 g/dL 91.4  78.2  95.6   Hematocrit 39.0 - 52.0 % 41.4  42.2  41.8   Platelets 150 - 400 K/uL 264  380  224       Latest Ref Rng & Units 01/04/2023   10:00 AM 12/12/2022   11:19 AM 11/28/2022    9:39 AM  CMP  Glucose 70 - 99 mg/dL 98  97  213   BUN 8 - 23 mg/dL 14  20  13    Creatinine 0.61 - 1.24 mg/dL 0.86  5.78  4.69   Sodium 135 - 145 mmol/L 137  137  136   Potassium 3.5 - 5.1 mmol/L 4.4  4.2  3.9   Chloride 98 - 111 mmol/L 102  102  101   CO2 22 - 32 mmol/L 28  26  25    Calcium 8.9 - 10.3 mg/dL 9.3  9.2  8.9   Total Protein 6.5 - 8.1 g/dL 6.5  6.5  6.3   Total Bilirubin 0.3 - 1.2 mg/dL 0.6  1.1  0.5   Alkaline Phos 38 - 126 U/L 51  70  124   AST 15 - 41 U/L 20  20  21    ALT 0 - 44 U/L 14  14  12       No results found for: "CEA1", "CEA" / No results found for: "CEA1", "CEA" Lab Results  Component Value Date   PSA1 0.3 08/30/2021   No results found for: "GEX528" No results found for: "CAN125"  No results found for: "TOTALPROTELP", "ALBUMINELP", "A1GS", "A2GS", "BETS", "BETA2SER", "GAMS", "MSPIKE", "SPEI" No results found for: "TIBC", "FERRITIN", "IRONPCTSAT" Lab Results  Component Value Date   LDH 129 03/22/2020     STUDIES:   No results found.

## 2023-01-04 ENCOUNTER — Inpatient Hospital Stay: Payer: Medicare Other

## 2023-01-04 ENCOUNTER — Inpatient Hospital Stay (HOSPITAL_BASED_OUTPATIENT_CLINIC_OR_DEPARTMENT_OTHER): Payer: Medicare Other | Admitting: Hematology

## 2023-01-04 VITALS — BP 116/75 | HR 60 | Temp 97.3°F | Resp 18

## 2023-01-04 DIAGNOSIS — Z95828 Presence of other vascular implants and grafts: Secondary | ICD-10-CM | POA: Diagnosis not present

## 2023-01-04 DIAGNOSIS — C61 Malignant neoplasm of prostate: Secondary | ICD-10-CM

## 2023-01-04 DIAGNOSIS — Z5111 Encounter for antineoplastic chemotherapy: Secondary | ICD-10-CM | POA: Diagnosis not present

## 2023-01-04 LAB — CBC WITH DIFFERENTIAL/PLATELET
Abs Immature Granulocytes: 0.01 10*3/uL (ref 0.00–0.07)
Basophils Absolute: 0.1 10*3/uL (ref 0.0–0.1)
Basophils Relative: 2 %
Eosinophils Absolute: 0.1 10*3/uL (ref 0.0–0.5)
Eosinophils Relative: 2 %
HCT: 41.4 % (ref 39.0–52.0)
Hemoglobin: 13.4 g/dL (ref 13.0–17.0)
Immature Granulocytes: 0 %
Lymphocytes Relative: 24 %
Lymphs Abs: 1 10*3/uL (ref 0.7–4.0)
MCH: 32.4 pg (ref 26.0–34.0)
MCHC: 32.4 g/dL (ref 30.0–36.0)
MCV: 100 fL (ref 80.0–100.0)
Monocytes Absolute: 0.5 10*3/uL (ref 0.1–1.0)
Monocytes Relative: 11 %
Neutro Abs: 2.5 10*3/uL (ref 1.7–7.7)
Neutrophils Relative %: 61 %
Platelets: 264 10*3/uL (ref 150–400)
RBC: 4.14 MIL/uL — ABNORMAL LOW (ref 4.22–5.81)
RDW: 13.5 % (ref 11.5–15.5)
WBC: 4.1 10*3/uL (ref 4.0–10.5)
nRBC: 0 % (ref 0.0–0.2)

## 2023-01-04 LAB — COMPREHENSIVE METABOLIC PANEL
ALT: 14 U/L (ref 0–44)
AST: 20 U/L (ref 15–41)
Albumin: 3.9 g/dL (ref 3.5–5.0)
Alkaline Phosphatase: 51 U/L (ref 38–126)
Anion gap: 7 (ref 5–15)
BUN: 14 mg/dL (ref 8–23)
CO2: 28 mmol/L (ref 22–32)
Calcium: 9.3 mg/dL (ref 8.9–10.3)
Chloride: 102 mmol/L (ref 98–111)
Creatinine, Ser: 1.01 mg/dL (ref 0.61–1.24)
GFR, Estimated: >60 mL/min (ref >60)
Glucose, Bld: 98 mg/dL (ref 70–99)
Potassium: 4.4 mmol/L (ref 3.5–5.1)
Sodium: 137 mmol/L (ref 135–145)
Total Bilirubin: 0.6 mg/dL (ref 0.3–1.2)
Total Protein: 6.5 g/dL (ref 6.5–8.1)

## 2023-01-04 LAB — MAGNESIUM: Magnesium: 2.1 mg/dL (ref 1.7–2.4)

## 2023-01-04 LAB — PSA: Prostatic Specific Antigen: 5.8 ng/mL — ABNORMAL HIGH (ref 0.00–4.00)

## 2023-01-04 MED ORDER — SODIUM CHLORIDE 0.9 % IV SOLN: Freq: Once | INTRAVENOUS | Status: AC

## 2023-01-04 MED ORDER — PALONOSETRON HCL INJECTION 0.25 MG/5ML
0.2500 mg | Freq: Once | INTRAVENOUS | Status: AC
  Administered 2023-01-04: 0.25 mg via INTRAVENOUS
  Filled 2023-01-04: qty 5

## 2023-01-04 MED ORDER — SODIUM CHLORIDE 0.9 % IV SOLN
15.0000 mg/m2 | Freq: Once | INTRAVENOUS | Status: AC
  Administered 2023-01-04: 27 mg via INTRAVENOUS
  Filled 2023-01-04: qty 2.7

## 2023-01-04 MED ORDER — SODIUM CHLORIDE 0.9 % IV SOLN
10.0000 mg | Freq: Once | INTRAVENOUS | Status: AC
  Administered 2023-01-04: 10 mg via INTRAVENOUS
  Filled 2023-01-04: qty 10

## 2023-01-04 MED ORDER — SODIUM CHLORIDE 0.9% FLUSH
10.0000 mL | Freq: Once | INTRAVENOUS | Status: AC
  Administered 2023-01-04: 10 mL via INTRAVENOUS

## 2023-01-04 MED ORDER — CETIRIZINE HCL 10 MG/ML IV SOLN
10.0000 mg | Freq: Once | INTRAVENOUS | Status: AC
  Administered 2023-01-04: 10 mg via INTRAVENOUS
  Filled 2023-01-04: qty 1

## 2023-01-04 MED ORDER — HEPARIN SOD (PORK) LOCK FLUSH 100 UNIT/ML IV SOLN
500.0000 [IU] | Freq: Once | INTRAVENOUS | Status: AC | PRN
  Administered 2023-01-04: 500 [IU]

## 2023-01-04 MED ORDER — SODIUM CHLORIDE 0.9% FLUSH
10.0000 mL | INTRAVENOUS | Status: DC | PRN
  Administered 2023-01-04: 10 mL

## 2023-01-04 MED ORDER — FAMOTIDINE IN NACL 20-0.9 MG/50ML-% IV SOLN
20.0000 mg | Freq: Once | INTRAVENOUS | Status: AC
  Administered 2023-01-04: 20 mg via INTRAVENOUS
  Filled 2023-01-04: qty 50

## 2023-01-04 NOTE — Patient Instructions (Signed)
[facility]  Discharge Instructions: Thank you for choosing [facility] to provide your oncology and hematology care.  If you have a lab appointment with the Cancer Center - please note that after April 8th, 2024, all labs will be drawn in the cancer center.  You do not have to check in or register with the main entrance as you have in the past but will complete your check-in in the cancer center.  Wear comfortable clothing and clothing appropriate for easy access to any Portacath or PICC line.   We strive to give you quality time with your provider. You may need to reschedule your appointment if you arrive late (15 or more minutes).  Arriving late affects you and other patients whose appointments are after yours.  Also, if you miss three or more appointments without notifying the office, you may be dismissed from the clinic at the provider's discretion.      For prescription refill requests, have your pharmacy contact our office and allow 72 hours for refills to be completed.    Today you received the following chemotherapy and/or immunotherapy agents Jevtana.Cabazitaxel Injection What is this medication? CABAZITAXEL (ka BAZ i TAX el) treats prostate cancer. It works by slowing down the growth of cancer cells. This medicine may be used for other purposes; ask your health care provider or pharmacist if you have questions. COMMON BRAND NAME(S): Jevtana What should I tell my care team before I take this medication? They need to know if you have any of these conditions: Kidney problems Liver disease Low white blood cell levels Lung disease Stomach or intestine problems An unusual or allergic reaction to cabazitaxel, polysorbate 80, other medications, foods, dyes, or preservatives Pregnant or trying to get pregnant Breast-feeding How should I use this medication? This medication is injected into a vein. It is given by your care team in a hospital or clinic  setting. Talk to your care team about the use of this medication in children. Special care may be needed. Overdosage: If you think you have taken too much of this medicine contact a poison control center or emergency room at once. NOTE: This medicine is only for you. Do not share this medicine with others. What if I miss a dose? Keep appointments for follow-up doses. It is important not to miss your dose. Call your care team if you are unable to keep an appointment. What may interact with this medication? Certain antibiotics, such as clarithromycin or telithromycin Certain antivirals for HIV or AIDS Certain medications for fungal infections like ketoconazole, itraconazole, and voriconazole Nefazodone This list may not describe all possible interactions. Give your health care provider a list of all the medicines, herbs, non-prescription drugs, or dietary supplements you use. Also tell them if you smoke, drink alcohol, or use illegal drugs. Some items may interact with your medicine. What should I watch for while using this medication? This medication may make you feel generally unwell. This is not uncommon as chemotherapy can affect healthy cells as well as cancer cells. Report any side effects. Continue your course of treatment even though you feel ill unless your care team tells you to stop. You may need blood work while you are taking this medication. This medication may increase your risk of getting an infection. Call your care team for advice if you get a fever, chills, sore throat, or other symptoms of a cold or flu. Do not treat yourself. Try to avoid being around people who  are sick. Avoid taking medications that contain aspirin, acetaminophen, ibuprofen, naproxen, or ketoprofen unless instructed by your care team. These medications may hide a fever. Be careful brushing or flossing your teeth or using a toothpick because you may get an infection or bleed more easily. If you have any dental  work done, tell your dentist you are receiving this medication. This medication can cause serious infusion reactions. To reduce the risk, your care team may give you other medications to take before receiving this one. Be sure to follow the directions from your care team. Use a condom during sex while taking this medication and for 4 months after the last dose. Talk to your care team right away if your partner may be pregnant. This medication can cause serious birth defects. This medication may cause infertility. Talk to your care team if you are concerned about your fertility. What side effects may I notice from receiving this medication? Side effects that you should report to your care team as soon as possible: Allergic reactions--skin rash, itching, hives, swelling of the face, lips, tongue, or throat Diarrhea, nausea, vomiting Dry cough, shortness of breath or trouble breathing Infection--fever, chills, cough, or sore throat Kidney injury--decrease in the amount of urine, swelling of the ankles, hands, or feet Pain, tingling, or numbness in the hands or feet Red or dark brown urine Stomach bleeding--bloody or black, tar-like stools, vomiting blood or brown material that looks like coffee grounds Stomach pain that is severe, does not away, or gets worse Unusual bruising or bleeding Side effects that usually do not require medical attention (report these to your care team if they continue or are bothersome): Loss of appetite Unusual weakness or fatigue This list may not describe all possible side effects. Call your doctor for medical advice about side effects. You may report side effects to FDA at 1-800-FDA-1088. Where should I keep my medication? This medication is given in a hospital or clinic. It will not be stored at home. NOTE: This sheet is a summary. It may not cover all possible information. If you have questions about this medicine, talk to your doctor, pharmacist, or health care  provider.  2024 Elsevier/Gold Standard (2022-10-01 00:00:00)       To help prevent nausea and vomiting after your treatment, we encourage you to take your nausea medication as directed.  BELOW ARE SYMPTOMS THAT SHOULD BE REPORTED IMMEDIATELY: *FEVER GREATER THAN 100.4 F (38 C) OR HIGHER *CHILLS OR SWEATING *NAUSEA AND VOMITING THAT IS NOT CONTROLLED WITH YOUR NAUSEA MEDICATION *UNUSUAL SHORTNESS OF BREATH *UNUSUAL BRUISING OR BLEEDING *URINARY PROBLEMS (pain or burning when urinating, or frequent urination) *BOWEL PROBLEMS (unusual diarrhea, constipation, pain near the anus) TENDERNESS IN MOUTH AND THROAT WITH OR WITHOUT PRESENCE OF ULCERS (sore throat, sores in mouth, or a toothache) UNUSUAL RASH, SWELLING OR PAIN  UNUSUAL VAGINAL DISCHARGE OR ITCHING   Items with * indicate a potential emergency and should be followed up as soon as possible or go to the Emergency Department if any problems should occur.  Please show the CHEMOTHERAPY ALERT CARD or IMMUNOTHERAPY ALERT CARD at check-in to the Emergency Department and triage nurse.  Should you have questions after your visit or need to cancel or reschedule your appointment, please contact [facility] [phone]  and follow the prompts.  Office hours are 8:00 a.m. to 4:30 p.m. Monday - Friday. Please note that voicemails left after 4:00 p.m. may not be returned until the following business day.  We  are closed weekends and major holidays. You have access to a nurse at all times for urgent questions. Please call the main number to the clinic [phone] and follow the prompts.  For any non-urgent questions, you may also contact your provider using MyChart. We now offer e-Visits for anyone 50 and older to request care online for non-urgent symptoms. For details visit [email or web address].   Also download the MyChart app! Go to the app store, search "MyChart", open the app, select [place], and log in with your  MyChart username and password.

## 2023-01-04 NOTE — Progress Notes (Signed)
Patient has been examined by Dr. Katragadda. Vital signs and labs have been reviewed by MD - ANC, Creatinine, LFTs, hemoglobin, and platelets are within treatment parameters per M.D. - pt may proceed with treatment.  Primary RN and pharmacy notified.  

## 2023-01-04 NOTE — Patient Instructions (Signed)
[facility] at [facility] Discharge Instructions   You were seen and examined today by Dr. Katragadda.  He reviewed the results of your lab work which are normal/stable.   We will proceed with your treatment today.  Return as scheduled.    Thank you for choosing [facility] at [facility] to provide your oncology and hematology care.  To afford each patient quality time with our provider, please arrive at least 15 minutes before your scheduled appointment time.   If you have a lab appointment with the Cancer Center please come in thru the Main Entrance and check in at the main information desk.  You need to re-schedule your appointment should you arrive 10 or more minutes late.  We strive to give you quality time with our providers, and arriving late affects you and other patients whose appointments are after yours.  Also, if you no show three or more times for appointments you may be dismissed from the clinic at the providers discretion.     Again, thank you for choosing [facility].  Our hope is that these requests will decrease the amount of time that you wait before being seen by our physicians.       _____________________________________________________________  Should you have questions after your visit to [facility], please contact our office at [phone] and follow the prompts.  Our office hours are 8:00 a.m. and 4:30 p.m. Monday - Friday.  Please note that voicemails left after 4:00 p.m. may not be returned until the following business day.  We are closed weekends and major holidays.  You do have access to a nurse 24-7, just call the main number to the clinic [phone] and do not press any options, hold on the line and a nurse will answer the phone.    For prescription refill requests, have your pharmacy contact our office and allow 72 hours.    Due to Covid, you will need to wear a mask upon entering  the hospital. If you do not have a mask, a mask will be given to you at the Main Entrance upon arrival. For doctor visits, patients may have 1 support person age 18 or older with them. For treatment visits, patients can not have anyone with them due to social distancing guidelines and our immunocompromised population.      

## 2023-01-04 NOTE — Progress Notes (Signed)
Confirmed Dr Ellin Saba wants to maintain today's Jevtana dose at 15 mg/m2.  T.O. Dr Carilyn Goodpasture, PharmD

## 2023-01-04 NOTE — Progress Notes (Signed)
Patient presents today for Jevtana D1 C3 with follow up visit with Dr.Katragadda. Vital signs within parameters for treatment. Labs within parameters for treatment.   Message received from A. Dareen Piano RN / Dr. Ellin Saba to proceed with treatment.   Jevtana given today per MD orders. Tolerated infusion without adverse affects. Vital signs stable. No complaints at this time. Discharged from clinic ambulatory in stable condition. Alert and oriented x 3. F/U with [facility] as scheduled.

## 2023-01-05 ENCOUNTER — Ambulatory Visit: Payer: Medicare Other

## 2023-01-11 ENCOUNTER — Ambulatory Visit (INDEPENDENT_AMBULATORY_CARE_PROVIDER_SITE_OTHER): Payer: Medicare Other | Admitting: Behavioral Health

## 2023-01-11 ENCOUNTER — Encounter: Payer: Self-pay | Admitting: Behavioral Health

## 2023-01-11 DIAGNOSIS — F4010 Social phobia, unspecified: Secondary | ICD-10-CM

## 2023-01-11 DIAGNOSIS — F411 Generalized anxiety disorder: Secondary | ICD-10-CM | POA: Diagnosis not present

## 2023-01-11 NOTE — Progress Notes (Signed)
                Rickell Wiehe M Cassi Jenne, LCMHC 

## 2023-01-11 NOTE — Progress Notes (Addendum)
[facility] Counselor/Therapist Progress Note  Patient ID: Kolbee Bogusz, MRN: [id number],    Date: 01/11/2023  Time Spent: 58 minutes spent via video session with the patient.  The patient was at home and this therapist was in his home office.  The patient is aware of the limitations of telehealth and video visits.  Treatment Type: Individual Therapy  Reported Symptoms: anxiety  Mental Status Exam: Appearance:  Well Groomed     Behavior: Appropriate  Motor: Normal  Speech/Language:  Clear and Coherent  Affect: Appropriate  Mood: anxious  Thought process: normal  Thought content:   WNL  Sensory/Perceptual disturbances:   WNL  Orientation: oriented to person, place, time/date, situation, day of week, and month of year  Attention: Good  Concentration: Good  Memory: WNL  Fund of knowledge:  Good  Insight:   Good  Judgment:  Good  Impulse Control: Good   Risk Assessment: Danger to Self:  No Self-injurious Behavior: No Danger to Others: No Duty to Warn:no Physical Aggression / Violence:No  Access to Firearms a concern: No  Gang Involvement:No   Subjective: This was my second session with the patient.  I reviewed information that we discussed in the intake and he agreed with goals for treatment plan are to reduce anxiety and depression.  He has been undergoing cancer treatment for 10 years but recently has had to increase the frequency as well as the strength of the chemotherapy because of a spot on his spine.  There are up and down days in terms of how he feels his energy level etc.  He is aware [place] her church and has still made it to church every Sunday but says her days he is very nauseous but does not want to take antinausea medication with everything else that he is taking.  [place] so sometimes church is the only contact that he has.  He is wrestling with how much to tell people or how bad he feels not wanting to  burden them but also not having much adult interaction throughout the week.  I reviewed anxiety reduction techniques with him and will meet with him in the office for the next 2 sessions.  He does contract for safety having no thoughts of hurting himself or anyone else.  Interventions: Cognitive Behavioral Therapy  Diagnosis: Generalized anxiety disorder, social anxiety disorder  Plan: I will meet with the patient every 3 to 4 weeks preferably in office. Treatment plan: We will use cognitive behavioral therapy as well as elements of dialectical behavior therapy and person centered therapy to help reduce the patient's anxiety by at least 50% by July 07, 2023 as well as support him as he goes to intensive chemotherapy for cancer.  Goals will be to improve his ability to manage anxiety symptoms better, resolve issues contributing to his anxiety including his past and treatment that he is going through as well as helping him manage thoughts and worrisome thinking contributing to feelings of anxiety.  We will facilitate problem solution skills to help him manage anxiety and stress, teach coping skills for managing anxiety and stress as well as using cognitive behavioral therapy to identify and change anxiety provoking thoughts and behavior patterns.  We will also teach DBT distress tolerance and mindfulness skills to help him learn better anxiety management. [name], [facility]

## 2023-01-16 ENCOUNTER — Other Ambulatory Visit: Payer: Self-pay

## 2023-01-22 NOTE — Progress Notes (Signed)
[facility] [address], [place] [address]    Clinic Day:  01/23/2023  Referring physician: Doreatha Massed, MD  Patient Care Team: Patient, No Pcp Per as PCP - General (General Practice) Doreatha Massed, MD as Consulting Physician (Oncology) Mickie Bail, RN as Oncology Nurse Navigator (Oncology)   ASSESSMENT & PLAN:   Assessment: 1.  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 -External beam radiation with 2 years of Lupron from 05/27/2013 through 07/28/2013.  Received Lupron until 09/28/2014. -Rising PSA levels in December 2018, of 2.8.  Local recurrence was confirmed with biopsy. -Bone scan on 04/12/2018 did not show any evidence of metastatic disease. -He was evaluated by Dr. Greggory Stallion at [facility].  Bone scan and CT scan at [facility] on 07/02/2018 did not show any evidence of metastatic disease.  Left para-aortic lymph node measures 8 mm.  Lupron started for nonmetastatic HSPC in November 2019 for rapid PSA doubling time of less than 3 months.  He had difficulty tolerating Lupron with irritability, low energy and fatigue. -PSA 5.7 (07/08/2019), PSA 11.1 (11/26/2019) with testosterone 18. -Fluciclovine PET CT scan on 12/23/2019 showed T11 meta stasis.  Asymmetric hypermetabolism within the prostate suspicious for residual disease.  Mild enlargement of 2 adjacent lower left para-aortic nodes, largest 8 mm with SUV of 3.4.  These nodes measured maximally 5 mm on PET scan on 02/25/2018. -Abiraterone 500 mg daily and prednisone from 01/03/2020 through 02/16/2020, discontinued secondary to elevated LFTs. -We will consider checking germline and somatic mutation testing. -Lupron 45 mg on 02/17/2020 at Dr. Lenoria Chime office. -Enzalutamide from 03/10/2020 through 10/17/2022 with progression. - Guardant360: MSI high not detected. NOTCH1 mutation. - Recommended cabazitaxel to minimize  incidence of neuropathy as he is a Technical sales engineer and plays guitar. - Cycle 1 cabazitaxel on 11/21/2022   2.  Heterozygosity for MUTYH mutation: - Cologuard test was refused previously.    Plan: 1.  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  He has fatigue which is mostly stable.  Denies any new onset pains. - His last PSA has gone up to 5.8 from 4.02. - We discussed various options including increasing the dose of cabazitaxel to 20 mg/m.  We will proceed with cycle 4 with increased dose. - If PSA continues to rise, options include repeating PET scan and moving forward with Pluvicto.  We discussed side effects of Pluvicto, being dry mouth.  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  He is concerned about dry mouth affecting his singing ability. - Other option is adding carboplatinum to cabazitaxel.  However that will increase his fatigue which is also unacceptable for him. - We will repeat PSA level 2 days prior to next visit.  If it continues to go high, will schedule for PSMA PET scan.   2.  Bone metastasis: - Continue calcium and vitamin D supplements.  Denosumab refused previously.   3.  Hypertension: - Continue losartan 25 mg daily.  Blood pressure is 129/54.   4.  Cancer-related fatigue: - He could not tolerate low-dose prednisone due to anxiety. - He takes tiny bit of Ritalin tablet as needed on Sundays. - Recommend trying American ginseng 2 g/day.    Orders Placed This Encounter  Procedures   Magnesium    Standing Status:   Future    Standing Expiration  Date:   02/14/2024   PSA    Standing Status:   Future    Standing Expiration Date:   02/14/2024   CBC with Differential    Standing Status:   Future    Standing Expiration Date:   02/14/2024   Comprehensive metabolic panel    Standing Status:   Future    Standing Expiration Date:   02/14/2024   Magnesium    Standing Status:   Future    Standing Expiration  Date:   03/06/2024   PSA    Standing Status:   Future    Standing Expiration Date:   03/06/2024   CBC with Differential    Standing Status:   Future    Standing Expiration Date:   03/06/2024   Comprehensive metabolic panel    Standing Status:   Future    Standing Expiration Date:   03/06/2024      I,Katie Daubenspeck,acting as a scribe for Doreatha Massed, MD.,have documented all relevant documentation on the behalf of Doreatha Massed, MD,as directed by  Doreatha Massed, MD while in the presence of Doreatha Massed, MD.   I, Doreatha Massed MD, have reviewed the above documentation for accuracy and completeness, and I agree with the above.   Doreatha Massed, MD   6/18/20244:58 PM  CHIEF COMPLAINT:   Diagnosis: castration resistant prostate cancer    Cancer Staging  Prostate cancer [facility]) Staging form: Prostate, AJCC 7th Edition - Clinical stage from 12/26/2019: Stage IV (yTX, N1, M1b, PSA: 10 to 19, Gleason 8-10) - Signed by Doreatha Massed, MD on 12/26/2019    Prior Therapy: 1. XRT 05/27/2013 - 07/28/2013  2. Lupron 03/2013 - 09/28/14 3. Abiraterone 12/24/19 - 02/16/20 4. enzalutamide 80 mg QD 03/10/20 - 10/17/22  Current Therapy:  cabazitaxel; Lupron every 6 months    HISTORY OF PRESENT ILLNESS:   Oncology History  Prostate cancer (HCC)  09/24/2012 Initial Diagnosis   Prostate cancer (HCC)   12/26/2019 Cancer Staging   Staging form: Prostate, AJCC 7th Edition - Clinical stage from 12/26/2019: Stage IV (yTX, N1, M1b, PSA: 10 to 19, Gleason 8-10) - Signed by Doreatha Massed, MD on 12/26/2019   08/24/2020 Genetic Testing   Positive genetic testing:  A single, 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 Testing was completed through the Common Hereditary Cancers panel and Prostate Cancer HRR panel offered by [facility] laboratories. The report date is 08/24/2020.   The Common Hereditary Cancers Panel offered by  [facility] includes sequencing and/or deletion duplication testing of the following 47 genes: APC, ATM, AXIN2, BARD1, BMPR1A, BRCA1, BRCA2, BRIP1, CDH1, CDK4, CDKN2A (p14ARF), CDKN2A (p16INK4a), CHEK2, CTNNA1, DICER1, EPCAM (Deletion/duplication testing only), GREM1 (promoter region deletion/duplication testing only), KIT, MEN1, MLH1, MSH2, MSH3, MSH6, MUTYH, NBN, NF1, NTHL1, PALB2, PDGFRA, PMS2, POLD1, POLE, PTEN, RAD50, RAD51C, RAD51D, SDHB, SDHC, SDHD, SMAD4, SMARCA4. STK11, TP53, TSC1, TSC2, and VHL.  The following genes were evaluated for sequence changes only: SDHA and HOXB13 c.251G>A variant only. The Prostate Cancer HRR Panel offered by [facility] includes sequencing and/or deletion/duplication analysis of the following 10 genes: ATM, BARD1, BRCA1, BRCA2, BRIP1, CHEK2, FANCL, PALB2, RAD51C, and RAD51D.   11/21/2022 -  Chemotherapy   Patient is on Treatment Plan : PROSTATE Cabazitaxel (20) D1 + Prednisone D1-21 q21d        INTERVAL HISTORY:   Ruari is a 72 y.o. male presenting to clinic today for follow up of castration resistant prostate cancer. He was last seen by me on 01/04/23.  Today, he states that he is doing well overall. His appetite level is at 100%. His energy level is at 70%.  PAST MEDICAL HISTORY:   Past Medical History: Past Medical History:  Diagnosis Date   Anxiety    Bilateral hydronephrosis    Bladder incontinence    night time,  past 2 months   BPH (benign prostatic hypertrophy) with urinary obstruction    Depression    ED (erectile dysfunction)    H/O ascites    trace   Hypertension    Port-A-Cath in place 11/13/2022   Prostate cancer (HCC) 09/24/2012   gleason 4+3=7., & 4+4=8,PSA=67.30, volume=33.7cc    Surgical History: Past Surgical History:  Procedure Laterality Date   IR IMAGING GUIDED PORT INSERTION  11/16/2022   PROSTATE BIOPSY  09/24/12   Adenocarcinoma   PROSTATE BIOPSY N/A 12/20/2017   Procedure: BIOPSY TRANSRECTAL ULTRASONIC PROSTATE (TUBP);   Surgeon: Marcine Matar, MD;  Location: [facility];  Service: Urology;  Laterality: N/A;   TONSILLECTOMY      Social History: Social History   Socioeconomic History   Marital status: Single    Spouse name: Not on file   Number of children: 0   Years of education: Not on file   Highest education level: Not on file  Occupational History   Occupation: [name]    Employer: [employer]  Tobacco Use   Smoking status: Never   Smokeless tobacco: Never  Vaping Use   Vaping Use: Never used  Substance and Sexual Activity   Alcohol use: Yes    Alcohol/week: 2.0 standard drinks of alcohol    Types: 2 Cans of beer per week    Comment: 3 per day, beer/wine , hx etoh abuse years ago   Drug use: No    Comment: hx   Sexual activity: Yes    Birth control/protection: Condom  Other Topics Concern   Not on file  Social History Narrative   Not on file   Social Determinants of Health   Financial Resource Strain: Low Risk  (12/08/2019)   Overall Financial Resource Strain (CARDIA)    Difficulty of Paying Living Expenses: Not very hard  Food Insecurity: No Food Insecurity (12/08/2019)   Hunger Vital Sign    Worried About Running Out of Food in the Last Year: Never true    Ran Out of Food in the Last Year: Never true  Transportation Needs: No Transportation Needs (12/08/2019)   PRAPARE - Administrator, Civil Service (Medical): No    Lack of Transportation (Non-Medical): No  Physical Activity: Sufficiently Active (12/08/2019)   Exercise Vital Sign    Days of Exercise per Week: 6 days    Minutes of Exercise per Session: 60 min  Stress: Stress Concern Present (12/08/2019)   [employer] of Occupational Health - Occupational Stress Questionnaire    Feeling of Stress : To some extent  Social Connections: Unknown (12/08/2019)   Social Connection and Isolation Panel [NHANES]    Frequency of Communication with Friends and Family: Twice a week    Frequency of Social  Gatherings with Friends and Family: Once a week    Attends Religious Services: More than 4 times per year    Active Member of [employer] or Organizations: No    Attends Engineer, structural: More than 4 times per year    Marital Status: Not on file  Intimate Partner Violence: Not At Risk (12/08/2019)   Humiliation, Afraid, Rape, and Kick questionnaire  Fear of Current or Ex-Partner: No    Emotionally Abused: No    Physically Abused: No    Sexually Abused: No    Family History: Family History  Problem Relation Age of Onset   Depression Mother    Dementia Mother    Anxiety disorder Mother    Prostate cancer Father        unconfirmed diagnosis    Current Medications:  Current Outpatient Medications:    Ascorbic Acid (VITAMIN C) 1000 MG tablet, Take 1,000 mg by mouth daily., Disp: , Rfl:    b complex vitamins capsule, Take 1 capsule by mouth daily., Disp: , Rfl:    Bacillus Coagulans-Inulin (PROBIOTIC) 1-250 BILLION-MG CAPS, Take by mouth daily. , Disp: , Rfl:    Cabazitaxel (JEVTANA IV), Inject into the vein every 21 ( twenty-one) days., Disp: , Rfl:    Cholecalciferol 125 MCG (5000 UT) capsule, Take 5,000 Units by mouth daily. , Disp: , Rfl:    Ginseng 100 MG CAPS, Take by mouth daily. , Disp: , Rfl:    L-ARGININE PO, Take 1,000 mg by mouth daily. , Disp: , Rfl:    lidocaine-prilocaine (EMLA) cream, Apply a small amount to port a cath site and cover with plastic wrap one hour prior to infusion appointments, Disp: 30 g, Rfl: 3   losartan (COZAAR) 25 MG tablet, TAKE 1 TABLET(25 MG) BY MOUTH DAILY, Disp: 90 tablet, Rfl: 3   melatonin 1 MG TABS tablet, Take 2 mg by mouth at bedtime. , Disp: , Rfl:    methylphenidate (RITALIN) 5 MG tablet, Take 0.5 tablets (2.5 mg total) by mouth daily as needed., Disp: 10 tablet, Rfl: 0   Probiotic Product (ALIGN) 4 MG CAPS, Take 1 capsule by mouth daily. , Disp: , Rfl:    prochlorperazine (COMPAZINE) 10 MG tablet, Take 1 tablet (10 mg total) by  mouth every 6 (six) hours as needed for nausea or vomiting., Disp: 60 tablet, Rfl: 1   tamsulosin (FLOMAX) 0.4 MG CAPS capsule, TAKE 1 CAPSULE(0.4 MG) BY MOUTH DAILY, Disp: 90 capsule, Rfl: 3   zinc gluconate 50 MG tablet, Take 50 mg by mouth daily., Disp: , Rfl:  No current facility-administered medications for this visit.  Facility-Administered Medications Ordered in Other Visits:    fulvestrant (FASLODEX) 250 MG/5ML injection, , , ,    sodium chloride flush (NS) 0.9 % injection 10 mL, 10 mL, Intracatheter, PRN, Doreatha Massed, MD, 10 mL at 01/23/23 1235   Allergies: No Known Allergies  REVIEW OF SYSTEMS:   Review of Systems  Constitutional:  Negative for chills, fatigue and fever.  HENT:   Negative for lump/mass, mouth sores, nosebleeds, sore throat and trouble swallowing.   Eyes:  Negative for eye problems.  Respiratory:  Negative for cough and shortness of breath.   Cardiovascular:  Negative for chest pain, leg swelling and palpitations.  Gastrointestinal:  Positive for constipation. Negative for abdominal pain, diarrhea, nausea and vomiting.  Genitourinary:  Negative for bladder incontinence, difficulty urinating, dysuria, frequency, hematuria and nocturia.   Musculoskeletal:  Negative for arthralgias, back pain, flank pain, myalgias and neck pain.  Skin:  Negative for itching and rash.  Neurological:  Positive for numbness. Negative for dizziness and headaches.  Hematological:  Does not bruise/bleed easily.  Psychiatric/Behavioral:  Negative for depression, sleep disturbance and suicidal ideas. The patient is not nervous/anxious.   All other systems reviewed and are negative.    VITALS:   There were no vitals taken for this visit.  Wt Readings from Last 3 Encounters:  01/23/23 145 lb 9.6 oz (66 kg)  01/04/23 144 lb 9.6 oz (65.6 kg)  12/12/22 142 lb (64.4 kg)    There is no height or weight on file to calculate BMI.  Performance status (ECOG): 1 - Symptomatic but  completely ambulatory  PHYSICAL EXAM:   Physical Exam Vitals and nursing note reviewed. Exam conducted with a chaperone present.  Constitutional:      Appearance: Normal appearance.  Cardiovascular:     Rate and Rhythm: Normal rate and regular rhythm.     Pulses: Normal pulses.     Heart sounds: Normal heart sounds.  Pulmonary:     Effort: Pulmonary effort is normal.     Breath sounds: Normal breath sounds.  Abdominal:     Palpations: Abdomen is soft. There is no hepatomegaly, splenomegaly or mass.     Tenderness: There is no abdominal tenderness.  Musculoskeletal:     Right lower leg: No edema.     Left lower leg: No edema.  Lymphadenopathy:     Cervical: No cervical adenopathy.     Right cervical: No superficial, deep or posterior cervical adenopathy.    Left cervical: No superficial, deep or posterior cervical adenopathy.     Upper Body:     Right upper body: No supraclavicular or axillary adenopathy.     Left upper body: No supraclavicular or axillary adenopathy.  Neurological:     General: No focal deficit present.     Mental Status: He is alert and oriented to person, place, and time.  Psychiatric:        Mood and Affect: Mood normal.        Behavior: Behavior normal.     LABS:      Latest Ref Rng & Units 01/23/2023    8:56 AM 01/04/2023   10:00 AM 12/12/2022   11:19 AM  CBC  WBC 4.0 - 10.5 K/uL 3.4  4.1  7.1   Hemoglobin 13.0 - 17.0 g/dL 16.1  09.6  04.5   Hematocrit 39.0 - 52.0 % 41.8  41.4  42.2   Platelets 150 - 400 K/uL 252  264  380       Latest Ref Rng & Units 01/23/2023    8:56 AM 01/04/2023   10:00 AM 12/12/2022   11:19 AM  CMP  Glucose 70 - 99 mg/dL 92  98  97   BUN 8 - 23 mg/dL 15  14  20    Creatinine 0.61 - 1.24 mg/dL 4.09  8.11  9.14   Sodium 135 - 145 mmol/L 137  137  137   Potassium 3.5 - 5.1 mmol/L 4.1  4.4  4.2   Chloride 98 - 111 mmol/L 101  102  102   CO2 22 - 32 mmol/L 27  28  26    Calcium 8.9 - 10.3 mg/dL 9.0  9.3  9.2   Total Protein  6.5 - 8.1 g/dL 6.7  6.5  6.5   Total Bilirubin 0.3 - 1.2 mg/dL 0.9  0.6  1.1   Alkaline Phos 38 - 126 U/L 49  51  70   AST 15 - 41 U/L 22  20  20    ALT 0 - 44 U/L 16  14  14       No results found for: "CEA1", "CEA" / No results found for: "CEA1", "CEA" Lab Results  Component Value Date   PSA1 0.3 08/30/2021   No results found for: "NWG956" No results  found for: "CAN125"  No results found for: "TOTALPROTELP", "ALBUMINELP", "A1GS", "A2GS", "BETS", "BETA2SER", "GAMS", "MSPIKE", "SPEI" No results found for: "TIBC", "FERRITIN", "IRONPCTSAT" Lab Results  Component Value Date   LDH 129 03/22/2020     STUDIES:   No results found.

## 2023-01-23 ENCOUNTER — Inpatient Hospital Stay: Payer: Medicare Other

## 2023-01-23 ENCOUNTER — Inpatient Hospital Stay (HOSPITAL_BASED_OUTPATIENT_CLINIC_OR_DEPARTMENT_OTHER): Payer: Medicare Other | Admitting: Hematology

## 2023-01-23 ENCOUNTER — Inpatient Hospital Stay: Payer: Medicare Other | Attending: Hematology

## 2023-01-23 VITALS — BP 129/72 | HR 57 | Temp 97.4°F | Resp 18

## 2023-01-23 DIAGNOSIS — C7951 Secondary malignant neoplasm of bone: Secondary | ICD-10-CM | POA: Diagnosis present

## 2023-01-23 DIAGNOSIS — Z923 Personal history of irradiation: Secondary | ICD-10-CM | POA: Diagnosis not present

## 2023-01-23 DIAGNOSIS — Z5111 Encounter for antineoplastic chemotherapy: Secondary | ICD-10-CM | POA: Insufficient documentation

## 2023-01-23 DIAGNOSIS — C61 Malignant neoplasm of prostate: Secondary | ICD-10-CM | POA: Insufficient documentation

## 2023-01-23 DIAGNOSIS — Z1509 Genetic susceptibility to other malignant neoplasm: Secondary | ICD-10-CM | POA: Diagnosis not present

## 2023-01-23 DIAGNOSIS — R530 Neoplastic (malignant) related fatigue: Secondary | ICD-10-CM | POA: Insufficient documentation

## 2023-01-23 DIAGNOSIS — Z8042 Family history of malignant neoplasm of prostate: Secondary | ICD-10-CM | POA: Diagnosis not present

## 2023-01-23 DIAGNOSIS — F419 Anxiety disorder, unspecified: Secondary | ICD-10-CM | POA: Diagnosis not present

## 2023-01-23 DIAGNOSIS — Z95828 Presence of other vascular implants and grafts: Secondary | ICD-10-CM

## 2023-01-23 DIAGNOSIS — I10 Essential (primary) hypertension: Secondary | ICD-10-CM | POA: Diagnosis not present

## 2023-01-23 DIAGNOSIS — Z79899 Other long term (current) drug therapy: Secondary | ICD-10-CM | POA: Diagnosis not present

## 2023-01-23 LAB — COMPREHENSIVE METABOLIC PANEL
ALT: 16 U/L (ref 0–44)
AST: 22 U/L (ref 15–41)
Albumin: 4 g/dL (ref 3.5–5.0)
Alkaline Phosphatase: 49 U/L (ref 38–126)
Anion gap: 9 (ref 5–15)
BUN: 15 mg/dL (ref 8–23)
CO2: 27 mmol/L (ref 22–32)
Calcium: 9 mg/dL (ref 8.9–10.3)
Chloride: 101 mmol/L (ref 98–111)
Creatinine, Ser: 1.08 mg/dL (ref 0.61–1.24)
GFR, Estimated: >60 mL/min (ref >60)
Glucose, Bld: 92 mg/dL (ref 70–99)
Potassium: 4.1 mmol/L (ref 3.5–5.1)
Sodium: 137 mmol/L (ref 135–145)
Total Bilirubin: 0.9 mg/dL (ref 0.3–1.2)
Total Protein: 6.7 g/dL (ref 6.5–8.1)

## 2023-01-23 LAB — CBC WITH DIFFERENTIAL/PLATELET
Abs Immature Granulocytes: 0 10*3/uL (ref 0.00–0.07)
Basophils Absolute: 0.1 10*3/uL (ref 0.0–0.1)
Basophils Relative: 2 %
Eosinophils Absolute: 0.1 10*3/uL (ref 0.0–0.5)
Eosinophils Relative: 3 %
HCT: 41.8 % (ref 39.0–52.0)
Hemoglobin: 13.7 g/dL (ref 13.0–17.0)
Immature Granulocytes: 0 %
Lymphocytes Relative: 31 %
Lymphs Abs: 1.1 10*3/uL (ref 0.7–4.0)
MCH: 32.5 pg (ref 26.0–34.0)
MCHC: 32.8 g/dL (ref 30.0–36.0)
MCV: 99.3 fL (ref 80.0–100.0)
Monocytes Absolute: 0.3 10*3/uL (ref 0.1–1.0)
Monocytes Relative: 9 %
Neutro Abs: 1.8 10*3/uL (ref 1.7–7.7)
Neutrophils Relative %: 55 %
Platelets: 252 10*3/uL (ref 150–400)
RBC: 4.21 MIL/uL — ABNORMAL LOW (ref 4.22–5.81)
RDW: 13.4 % (ref 11.5–15.5)
WBC: 3.4 10*3/uL — ABNORMAL LOW (ref 4.0–10.5)
nRBC: 0 % (ref 0.0–0.2)

## 2023-01-23 LAB — MAGNESIUM: Magnesium: 2.1 mg/dL (ref 1.7–2.4)

## 2023-01-23 LAB — PSA: Prostatic Specific Antigen: 7.31 ng/mL — ABNORMAL HIGH (ref 0.00–4.00)

## 2023-01-23 MED ORDER — SODIUM CHLORIDE 0.9 % IV SOLN: Freq: Once | INTRAVENOUS | Status: AC

## 2023-01-23 MED ORDER — FAMOTIDINE IN NACL 20-0.9 MG/50ML-% IV SOLN
20.0000 mg | Freq: Once | INTRAVENOUS | Status: AC
  Administered 2023-01-23: 20 mg via INTRAVENOUS
  Filled 2023-01-23: qty 50

## 2023-01-23 MED ORDER — CETIRIZINE HCL 10 MG/ML IV SOLN
10.0000 mg | Freq: Once | INTRAVENOUS | Status: AC
  Administered 2023-01-23: 10 mg via INTRAVENOUS
  Filled 2023-01-23: qty 1

## 2023-01-23 MED ORDER — SODIUM CHLORIDE 0.9 % IV SOLN
10.0000 mg | Freq: Once | INTRAVENOUS | Status: AC
  Administered 2023-01-23: 10 mg via INTRAVENOUS
  Filled 2023-01-23: qty 10

## 2023-01-23 MED ORDER — HEPARIN SOD (PORK) LOCK FLUSH 100 UNIT/ML IV SOLN
500.0000 [IU] | Freq: Once | INTRAVENOUS | Status: AC | PRN
  Administered 2023-01-23: 500 [IU]

## 2023-01-23 MED ORDER — SODIUM CHLORIDE 0.9% FLUSH
10.0000 mL | INTRAVENOUS | Status: DC | PRN
  Administered 2023-01-23: 10 mL via INTRAVENOUS

## 2023-01-23 MED ORDER — SODIUM CHLORIDE 0.9 % IV SOLN
20.0000 mg/m2 | Freq: Once | INTRAVENOUS | Status: AC
  Administered 2023-01-23: 36 mg via INTRAVENOUS
  Filled 2023-01-23: qty 3.6

## 2023-01-23 MED ORDER — SODIUM CHLORIDE 0.9% FLUSH
10.0000 mL | INTRAVENOUS | Status: DC | PRN
  Administered 2023-01-23: 10 mL

## 2023-01-23 MED ORDER — PALONOSETRON HCL INJECTION 0.25 MG/5ML
0.2500 mg | Freq: Once | INTRAVENOUS | Status: AC
  Administered 2023-01-23: 0.25 mg via INTRAVENOUS
  Filled 2023-01-23: qty 5

## 2023-01-23 NOTE — Progress Notes (Signed)
Labs reviewed today with MD at office visit. Ok to treat per MD.  Treatment given per orders. Patient tolerated it well without problems. Vitals stable and discharged home from clinic ambulatory. Follow up as scheduled.

## 2023-01-23 NOTE — Patient Instructions (Signed)
[facility] [place]  Discharge Instructions: Thank you for choosing [facility] to provide your oncology and hematology care.  If you have a lab appointment with the Cancer Center - please note that after April 8th, 2024, all labs will be drawn in the cancer center.  You do not have to check in or register with the main entrance as you have in the past but will complete your check-in in the cancer center.  Wear comfortable clothing and clothing appropriate for easy access to any Portacath or PICC line.   We strive to give you quality time with your provider. You may need to reschedule your appointment if you arrive late (15 or more minutes).  Arriving late affects you and other patients whose appointments are after yours.  Also, if you miss three or more appointments without notifying the office, you may be dismissed from the clinic at the provider's discretion.      For prescription refill requests, have your pharmacy contact our office and allow 72 hours for refills to be completed.    Today you received the following chemotherapy and/or immunotherapy agents Jevtana      To help prevent nausea and vomiting after your treatment, we encourage you to take your nausea medication as directed.  BELOW ARE SYMPTOMS THAT SHOULD BE REPORTED IMMEDIATELY: *FEVER GREATER THAN 100.4 F (38 C) OR HIGHER *CHILLS OR SWEATING *NAUSEA AND VOMITING THAT IS NOT CONTROLLED WITH YOUR NAUSEA MEDICATION *UNUSUAL SHORTNESS OF BREATH *UNUSUAL BRUISING OR BLEEDING *URINARY PROBLEMS (pain or burning when urinating, or frequent urination) *BOWEL PROBLEMS (unusual diarrhea, constipation, pain near the anus) TENDERNESS IN MOUTH AND THROAT WITH OR WITHOUT PRESENCE OF ULCERS (sore throat, sores in mouth, or a toothache) UNUSUAL RASH, SWELLING OR PAIN  UNUSUAL VAGINAL DISCHARGE OR ITCHING   Items with * indicate a potential emergency and should be followed up as soon as possible or go to the  Emergency Department if any problems should occur.  Please show the CHEMOTHERAPY ALERT CARD or IMMUNOTHERAPY ALERT CARD at check-in to the Emergency Department and triage nurse.  Should you have questions after your visit or need to cancel or reschedule your appointment, please contact [facility] [place] [phone]  and follow the prompts.  Office hours are 8:00 a.m. to 4:30 p.m. Monday - Friday. Please note that voicemails left after 4:00 p.m. may not be returned until the following business day.  We are closed weekends and major holidays. You have access to a nurse at all times for urgent questions. Please call the main number to the clinic [phone] and follow the prompts.  For any non-urgent questions, you may also contact your provider using MyChart. We now offer e-Visits for anyone 18 and older to request care online for non-urgent symptoms. For details visit [email or web address].   Also download the MyChart app! Go to the app store, search "MyChart", open the app, select [facility], and log in with your MyChart username and password.   

## 2023-01-23 NOTE — Patient Instructions (Signed)
[facility] Discharge Instructions   You were seen and examined today by Dr. Ellin Saba.  He reviewed the results of your lab work which are normal/stable.   Your PSA results we have for the last 2 times it was checked is going up. Dr. Kirtland Bouchard recommends going up on the dose of the Jevtana to the standard dose to see if this helps lower the PSA.   We will proceed with your treatment today.   We will arrange for you to have a scan after today's treatment.   Return as scheduled.    Thank you for choosing [facility] at [facility] to provide your oncology and hematology care.  To afford each patient quality time with our provider, please arrive at least 15 minutes before your scheduled appointment time.   If you have a lab appointment with the Cancer Center please come in thru the Main Entrance and check in at the main information desk.  You need to re-schedule your appointment should you arrive 10 or more minutes late.  We strive to give you quality time with our providers, and arriving late affects you and other patients whose appointments are after yours.  Also, if you no show three or more times for appointments you may be dismissed from the clinic at the providers discretion.     Again, thank you for choosing [facility].  Our hope is that these requests will decrease the amount of time that you wait before being seen by our physicians.       _____________________________________________________________  Should you have questions after your visit to [facility], please contact our office at [phone] and follow the prompts.  Our office hours are 8:00 a.m. and 4:30 p.m. Monday - Friday.  Please note that voicemails left after 4:00 p.m. may not be returned until the following business day.  We are closed weekends and major holidays.  You do have access to a nurse 24-7, just call the main number to the clinic  [phone] and do not press any options, hold on the line and a nurse will answer the phone.    For prescription refill requests, have your pharmacy contact our office and allow 72 hours.    Due to Covid, you will need to wear a mask upon entering the hospital. If you do not have a mask, a mask will be given to you at the Main Entrance upon arrival. For doctor visits, patients may have 1 support person age 4 or older with them. For treatment visits, patients can not have anyone with them due to social distancing guidelines and our immunocompromised population.

## 2023-01-23 NOTE — Progress Notes (Signed)
Patients port flushed without difficulty.  Good blood return noted with no bruising or swelling noted at site.  Patient remains accessed for treatment.  

## 2023-01-23 NOTE — Progress Notes (Signed)
Patient has been examined by Dr. Katragadda. Vital signs and labs have been reviewed by MD - ANC, Creatinine, LFTs, hemoglobin, and platelets are within treatment parameters per M.D. - pt may proceed with treatment.  Primary RN and pharmacy notified.  

## 2023-01-25 ENCOUNTER — Ambulatory Visit: Payer: Medicare Other

## 2023-01-25 ENCOUNTER — Other Ambulatory Visit: Payer: Self-pay

## 2023-01-26 ENCOUNTER — Other Ambulatory Visit: Payer: Self-pay

## 2023-01-30 ENCOUNTER — Ambulatory Visit (INDEPENDENT_AMBULATORY_CARE_PROVIDER_SITE_OTHER): Payer: Medicare Other | Admitting: Behavioral Health

## 2023-01-30 ENCOUNTER — Encounter: Payer: Self-pay | Admitting: Behavioral Health

## 2023-01-30 DIAGNOSIS — F411 Generalized anxiety disorder: Secondary | ICD-10-CM | POA: Diagnosis not present

## 2023-01-30 NOTE — Progress Notes (Signed)
  [facility] Counselor/Therapist Progress Note  Patient ID: Nathan Smith, MRN: [id number],    Date: 01/30/2023  Time Spent: 29 minutes, from 1:00 PM to 1:29 PM.  This session was held via video teletherapy. The patient consented to the video teletherapy and was located in his home during this session. He is aware it is the responsibility of the patient to secure confidentiality on his end of the session. The provider was in and outpatient therapy office for the duration of this session.    Treatment Type: Individual Therapy  Reported Symptoms: anxiety  Mental Status Exam: Appearance:  Well Groomed     Behavior: Appropriate  Motor: Normal  Speech/Language:  Clear and Coherent  Affect: Appropriate  Mood: anxious  Thought process: normal  Thought content:   WNL  Sensory/Perceptual disturbances:   WNL  Orientation: oriented to person, place, time/date, situation, day of week, and month of year  Attention: Good  Concentration: Good  Memory: WNL  Fund of knowledge:  Good  Insight:   Good  Judgment:  Good  Impulse Control: Good   Risk Assessment: Danger to Self:  No Self-injurious Behavior: No Danger to Others: No Duty to Warn:no Physical Aggression / Violence:No  Access to Firearms a concern: No  Gang Involvement:No   Subjective: The patient found out last week that 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  He did not find out until today that the funeral was at 2:00 so we ended the session early so we he could attend the funeral. He has recently started on a stronger chemo's tx. Because his numbers/counts were increasing. He will meet with his MD in two weeks for blood work to see if the stronger tx. Is working. He is not sure what tx. Alternatives there are after this if needed. He is tired but remaining positive. 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 He does contract for safety having no thoughts  of hurting himself or anyone else.  Interventions: Cognitive Behavioral Therapy  Diagnosis: Generalized anxiety disorder, social anxiety disorder  Plan: I will meet with the patient every 3 to 4 weeks preferably in office. Treatment plan: We will use cognitive behavioral therapy as well as elements of dialectical behavior therapy and person centered therapy to help reduce the patient's anxiety by at least 50% by July 07, 2023 as well as support him as he goes to intensive chemotherapy for cancer.  Goals will be to improve his ability to manage anxiety symptoms better, resolve issues contributing to his anxiety including his past and treatment that he is going through as well as helping him manage thoughts and worrisome thinking contributing to feelings of anxiety.  We will facilitate problem solution skills to help him manage anxiety and stress, teach coping skills for managing anxiety and stress as well as using cognitive behavioral therapy to identify and change anxiety provoking thoughts and behavior patterns.  We will also teach DBT distress tolerance and mindfulness skills to help him learn better anxiety management. [name], [place]

## 2023-02-09 ENCOUNTER — Other Ambulatory Visit: Payer: Self-pay | Admitting: Hematology

## 2023-02-09 DIAGNOSIS — C61 Malignant neoplasm of prostate: Secondary | ICD-10-CM

## 2023-02-12 ENCOUNTER — Inpatient Hospital Stay: Payer: Medicare Other | Attending: Hematology

## 2023-02-12 ENCOUNTER — Encounter (HOSPITAL_COMMUNITY): Payer: Self-pay | Admitting: Hematology

## 2023-02-12 ENCOUNTER — Encounter: Payer: Self-pay | Admitting: Hematology

## 2023-02-12 DIAGNOSIS — Z79899 Other long term (current) drug therapy: Secondary | ICD-10-CM | POA: Diagnosis not present

## 2023-02-12 DIAGNOSIS — I10 Essential (primary) hypertension: Secondary | ICD-10-CM | POA: Diagnosis not present

## 2023-02-12 DIAGNOSIS — C61 Malignant neoplasm of prostate: Secondary | ICD-10-CM | POA: Diagnosis present

## 2023-02-12 DIAGNOSIS — R110 Nausea: Secondary | ICD-10-CM | POA: Diagnosis not present

## 2023-02-12 DIAGNOSIS — Z5111 Encounter for antineoplastic chemotherapy: Secondary | ICD-10-CM | POA: Diagnosis present

## 2023-02-12 DIAGNOSIS — Z923 Personal history of irradiation: Secondary | ICD-10-CM | POA: Insufficient documentation

## 2023-02-12 DIAGNOSIS — C7951 Secondary malignant neoplasm of bone: Secondary | ICD-10-CM | POA: Insufficient documentation

## 2023-02-12 DIAGNOSIS — R972 Elevated prostate specific antigen [PSA]: Secondary | ICD-10-CM | POA: Diagnosis not present

## 2023-02-12 DIAGNOSIS — Z1509 Genetic susceptibility to other malignant neoplasm: Secondary | ICD-10-CM | POA: Insufficient documentation

## 2023-02-12 DIAGNOSIS — Z8042 Family history of malignant neoplasm of prostate: Secondary | ICD-10-CM | POA: Diagnosis not present

## 2023-02-12 DIAGNOSIS — R530 Neoplastic (malignant) related fatigue: Secondary | ICD-10-CM | POA: Diagnosis not present

## 2023-02-12 DIAGNOSIS — Z95828 Presence of other vascular implants and grafts: Secondary | ICD-10-CM

## 2023-02-12 LAB — COMPREHENSIVE METABOLIC PANEL
ALT: 14 U/L (ref 0–44)
AST: 20 U/L (ref 15–41)
Albumin: 3.9 g/dL (ref 3.5–5.0)
Alkaline Phosphatase: 49 U/L (ref 38–126)
Anion gap: 9 (ref 5–15)
BUN: 13 mg/dL (ref 8–23)
CO2: 27 mmol/L (ref 22–32)
Calcium: 9.2 mg/dL (ref 8.9–10.3)
Chloride: 102 mmol/L (ref 98–111)
Creatinine, Ser: 1.03 mg/dL (ref 0.61–1.24)
GFR, Estimated: >60 mL/min (ref >60)
Glucose, Bld: 88 mg/dL (ref 70–99)
Potassium: 4.2 mmol/L (ref 3.5–5.1)
Sodium: 138 mmol/L (ref 135–145)
Total Bilirubin: 0.4 mg/dL (ref 0.3–1.2)
Total Protein: 6.7 g/dL (ref 6.5–8.1)

## 2023-02-12 LAB — CBC WITH DIFFERENTIAL/PLATELET
Abs Immature Granulocytes: 0.06 10*3/uL (ref 0.00–0.07)
Basophils Absolute: 0.1 10*3/uL (ref 0.0–0.1)
Basophils Relative: 1 %
Eosinophils Absolute: 0 10*3/uL (ref 0.0–0.5)
Eosinophils Relative: 0 %
HCT: 40.7 % (ref 39.0–52.0)
Hemoglobin: 13.4 g/dL (ref 13.0–17.0)
Immature Granulocytes: 2 %
Lymphocytes Relative: 32 %
Lymphs Abs: 1.1 10*3/uL (ref 0.7–4.0)
MCH: 32.8 pg (ref 26.0–34.0)
MCHC: 32.9 g/dL (ref 30.0–36.0)
MCV: 99.8 fL (ref 80.0–100.0)
Monocytes Absolute: 0.6 10*3/uL (ref 0.1–1.0)
Monocytes Relative: 16 %
Neutro Abs: 1.7 10*3/uL (ref 1.7–7.7)
Neutrophils Relative %: 49 %
Platelets: 285 10*3/uL (ref 150–400)
RBC: 4.08 MIL/uL — ABNORMAL LOW (ref 4.22–5.81)
RDW: 13.8 % (ref 11.5–15.5)
WBC: 3.5 10*3/uL — ABNORMAL LOW (ref 4.0–10.5)
nRBC: 0 % (ref 0.0–0.2)

## 2023-02-12 LAB — MAGNESIUM: Magnesium: 2.2 mg/dL (ref 1.7–2.4)

## 2023-02-12 LAB — PSA: Prostatic Specific Antigen: 7.74 ng/mL — ABNORMAL HIGH (ref 0.00–4.00)

## 2023-02-12 NOTE — Progress Notes (Signed)
Patient elected for peripheral lab draw for blood work today.

## 2023-02-13 ENCOUNTER — Encounter: Payer: Self-pay | Admitting: Hematology

## 2023-02-13 ENCOUNTER — Inpatient Hospital Stay (HOSPITAL_BASED_OUTPATIENT_CLINIC_OR_DEPARTMENT_OTHER): Payer: Medicare Other | Admitting: Hematology

## 2023-02-13 ENCOUNTER — Inpatient Hospital Stay: Payer: Medicare Other

## 2023-02-13 VITALS — BP 126/83 | HR 67 | Temp 98.1°F | Resp 16

## 2023-02-13 DIAGNOSIS — Z95828 Presence of other vascular implants and grafts: Secondary | ICD-10-CM

## 2023-02-13 DIAGNOSIS — Z5111 Encounter for antineoplastic chemotherapy: Secondary | ICD-10-CM | POA: Diagnosis not present

## 2023-02-13 DIAGNOSIS — C61 Malignant neoplasm of prostate: Secondary | ICD-10-CM

## 2023-02-13 MED ORDER — SODIUM CHLORIDE 0.9 % IV SOLN
20.0000 mg/m2 | Freq: Once | INTRAVENOUS | Status: AC
  Administered 2023-02-13: 36 mg via INTRAVENOUS
  Filled 2023-02-13: qty 3.6

## 2023-02-13 MED ORDER — FAMOTIDINE IN NACL 20-0.9 MG/50ML-% IV SOLN
20.0000 mg | Freq: Once | INTRAVENOUS | Status: AC
  Administered 2023-02-13: 20 mg via INTRAVENOUS
  Filled 2023-02-13: qty 50

## 2023-02-13 MED ORDER — PALONOSETRON HCL INJECTION 0.25 MG/5ML
0.2500 mg | Freq: Once | INTRAVENOUS | Status: AC
  Administered 2023-02-13: 0.25 mg via INTRAVENOUS
  Filled 2023-02-13: qty 5

## 2023-02-13 MED ORDER — SODIUM CHLORIDE 0.9% FLUSH
10.0000 mL | INTRAVENOUS | Status: DC | PRN
  Administered 2023-02-13: 10 mL

## 2023-02-13 MED ORDER — HEPARIN SOD (PORK) LOCK FLUSH 100 UNIT/ML IV SOLN
500.0000 [IU] | Freq: Once | INTRAVENOUS | Status: AC | PRN
  Administered 2023-02-13: 500 [IU]

## 2023-02-13 MED ORDER — CETIRIZINE HCL 10 MG/ML IV SOLN
10.0000 mg | Freq: Once | INTRAVENOUS | Status: AC
  Administered 2023-02-13: 10 mg via INTRAVENOUS
  Filled 2023-02-13: qty 1

## 2023-02-13 MED ORDER — SODIUM CHLORIDE 0.9 % IV SOLN
150.0000 mg | Freq: Once | INTRAVENOUS | Status: AC
  Administered 2023-02-13: 150 mg via INTRAVENOUS
  Filled 2023-02-13: qty 5

## 2023-02-13 MED ORDER — SODIUM CHLORIDE 0.9 % IV SOLN: Freq: Once | INTRAVENOUS | Status: AC

## 2023-02-13 MED ORDER — SODIUM CHLORIDE 0.9 % IV SOLN
14.0000 mg | Freq: Once | INTRAVENOUS | Status: AC
  Administered 2023-02-13: 14 mg via INTRAVENOUS
  Filled 2023-02-13: qty 1.4

## 2023-02-13 NOTE — Patient Instructions (Addendum)
[facility] at [facility] Discharge Instructions   You were seen and examined today by Dr. Ellin Saba.  He reviewed the results of your lab work which are normal/stable. Your PSA has slowed in rising since we gave you the stronger dose of chemotherapy.   We will proceed with your treatment today. We will continue with the same dose of chemotherapy.   Return as scheduled.    Thank you for choosing [facility] at [facility] to provide your oncology and hematology care.  To afford each patient quality time with our provider, please arrive at least 15 minutes before your scheduled appointment time.   If you have a lab appointment with the Cancer Center please come in thru the Main Entrance and check in at the main information desk.  You need to re-schedule your appointment should you arrive 10 or more minutes late.  We strive to give you quality time with our providers, and arriving late affects you and other patients whose appointments are after yours.  Also, if you no show three or more times for appointments you may be dismissed from the clinic at the providers discretion.     Again, thank you for choosing [facility].  Our hope is that these requests will decrease the amount of time that you wait before being seen by our physicians.       _____________________________________________________________  Should you have questions after your visit to [facility], please contact our office at [phone] and follow the prompts.  Our office hours are 8:00 a.m. and 4:30 p.m. Monday - Friday.  Please note that voicemails left after 4:00 p.m. may not be returned until the following business day.  We are closed weekends and major holidays.  You do have access to a nurse 24-7, just call the main number to the clinic [phone] and do not press any options, hold on the line and a nurse will answer the phone.    For prescription  refill requests, have your pharmacy contact our office and allow 72 hours.    Due to Covid, you will need to wear a mask upon entering the hospital. If you do not have a mask, a mask will be given to you at the Main Entrance upon arrival. For doctor visits, patients may have 1 support person age 51 or older with them. For treatment visits, patients can not have anyone with them due to social distancing guidelines and our immunocompromised population.

## 2023-02-13 NOTE — Patient Instructions (Signed)
[facility]  Discharge Instructions: Thank you for choosing [facility] to provide your oncology and hematology care.  If you have a lab appointment with the Cancer Center - please note that after April 8th, 2024, all labs will be drawn in the cancer center.  You do not have to check in or register with the main entrance as you have in the past but will complete your check-in in the cancer center.  Wear comfortable clothing and clothing appropriate for easy access to any Portacath or PICC line.   We strive to give you quality time with your provider. You may need to reschedule your appointment if you arrive late (15 or more minutes).  Arriving late affects you and other patients whose appointments are after yours.  Also, if you miss three or more appointments without notifying the office, you may be dismissed from the clinic at the provider's discretion.      For prescription refill requests, have your pharmacy contact our office and allow 72 hours for refills to be completed.    Today you received the following chemotherapy and/or immunotherapy agents: cabazitaxel      To help prevent nausea and vomiting after your treatment, we encourage you to take your nausea medication as directed.  BELOW ARE SYMPTOMS THAT SHOULD BE REPORTED IMMEDIATELY: *FEVER GREATER THAN 100.4 F (38 C) OR HIGHER *CHILLS OR SWEATING *NAUSEA AND VOMITING THAT IS NOT CONTROLLED WITH YOUR NAUSEA MEDICATION *UNUSUAL SHORTNESS OF BREATH *UNUSUAL BRUISING OR BLEEDING *URINARY PROBLEMS (pain or burning when urinating, or frequent urination) *BOWEL PROBLEMS (unusual diarrhea, constipation, pain near the anus) TENDERNESS IN MOUTH AND THROAT WITH OR WITHOUT PRESENCE OF ULCERS (sore throat, sores in mouth, or a toothache) UNUSUAL RASH, SWELLING OR PAIN  UNUSUAL VAGINAL DISCHARGE OR ITCHING   Items with * indicate a potential emergency and should be followed up as soon as possible or go to  the Emergency Department if any problems should occur.  Please show the CHEMOTHERAPY ALERT CARD or IMMUNOTHERAPY ALERT CARD at check-in to the Emergency Department and triage nurse.  Should you have questions after your visit or need to cancel or reschedule your appointment, please contact [facility] [phone]  and follow the prompts.  Office hours are 8:00 a.m. to 4:30 p.m. Monday - Friday. Please note that voicemails left after 4:00 p.m. may not be returned until the following business day.  We are closed weekends and major holidays. You have access to a nurse at all times for urgent questions. Please call the main number to the clinic [phone] and follow the prompts.  For any non-urgent questions, you may also contact your provider using MyChart. We now offer e-Visits for anyone 26 and older to request care online for non-urgent symptoms. For details visit [email or web address].   Also download the MyChart app! Go to the app store, search "MyChart", open the app, select , and log in with your MyChart username and password.

## 2023-02-13 NOTE — Progress Notes (Signed)
Patient has been examined by Dr. Katragadda. Vital signs and labs have been reviewed by MD - ANC, Creatinine, LFTs, hemoglobin, and platelets are within treatment parameters per M.D. - pt may proceed with treatment.  Primary RN and pharmacy notified.  

## 2023-02-13 NOTE — Progress Notes (Signed)
[facility] [address], [place] [address]    Clinic Day:  02/13/2023  Referring physician: Doreatha Massed, MD  Patient Care Team: Patient, No Pcp Per as PCP - General (General Practice) Doreatha Massed, MD as Consulting Physician (Oncology) Mickie Bail, RN as Oncology Nurse Navigator (Oncology)   ASSESSMENT & PLAN:   Assessment: 1.  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 -External beam radiation with 2 years of Lupron from 05/27/2013 through 07/28/2013.  Received Lupron until 09/28/2014. -Rising PSA levels in December 2018, of 2.8.  Local recurrence was confirmed with biopsy. -Bone scan on 04/12/2018 did not show any evidence of metastatic disease. -He was evaluated by Dr. Greggory Stallion at [facility].  Bone scan and CT scan at [facility] on 07/02/2018 did not show any evidence of metastatic disease.  Left para-aortic lymph node measures 8 mm.  Lupron started for nonmetastatic HSPC in November 2019 for rapid PSA doubling time of less than 3 months.  He had difficulty tolerating Lupron with irritability, low energy and fatigue. -PSA 5.7 (07/08/2019), PSA 11.1 (11/26/2019) with testosterone 18. -Fluciclovine PET CT scan on 12/23/2019 showed T11 meta stasis.  Asymmetric hypermetabolism within the prostate suspicious for residual disease.  Mild enlargement of 2 adjacent lower left para-aortic nodes, largest 8 mm with SUV of 3.4.  These nodes measured maximally 5 mm on PET scan on 02/25/2018. -Abiraterone 500 mg daily and prednisone from 01/03/2020 through 02/16/2020, discontinued secondary to elevated LFTs. -We will consider checking germline and somatic mutation testing. -Lupron 45 mg on 02/17/2020 at Dr. Lenoria Chime office. -Enzalutamide from 03/10/2020 through 10/17/2022 with progression. - Guardant360: MSI high not detected. NOTCH1 mutation. - Recommended cabazitaxel to minimize  incidence of neuropathy as he is a Technical sales engineer and plays guitar. - Cycle 1 cabazitaxel on 11/21/2022   2.  Heterozygosity for MUTYH mutation: - Cologuard test was refused previously.    Plan: 1.  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 - Steroids make him feel better for couple of days after each treatment.  He felt slightly more tired few days after that.  Tiredness improved in the last 2 weeks. - Reported nausea and did not take Compazine due to drowsiness. - I will add Emend to the current treatment regimen. - Will also increase dexamethasone to 14 mg in the premeds. - Reviewed labs today: Normal LFTs.  White count is grossly normal.  PSA 7.74, slightly increased from 7.31. - As the PSA velocity has slowed down, recommend continuing 1 more cycle of cabazitaxel at increased dose of 20 mg/m. - If the PSA significantly increases, will consider PSMA PET scan followed by Pluvicto.  Other option is adding carboplatinum to cabazitaxel which may likely worsen his fatigue. - RTC 3 weeks for follow-up.   2.  Bone metastasis: - Continue calcium and vitamin D supplements.  Denosumab refused previously.   3.  Hypertension: - Continue losartan 25 mg daily.  Blood pressure is 136/89.   4.  Cancer-related fatigue: - Continue tiny doses of Ritalin as needed on Sundays. - Continue American ginseng.    No orders of the defined types were placed in this encounter.     [name],acting as a Neurosurgeon for Doreatha Massed, MD.,have documented all relevant documentation on the behalf of Doreatha Massed, MD,as directed by  Doreatha Massed,  MD while in the presence of Doreatha Massed, MD.  I, Doreatha Massed MD, have reviewed the above documentation for accuracy and completeness, and I agree with the above.    Doreatha Massed, MD   7/9/20245:23 PM  CHIEF COMPLAINT:   Diagnosis: castration  resistant prostate cancer    Cancer Staging  Prostate cancer [facility]) Staging form: Prostate, AJCC 7th Edition - Clinical stage from 12/26/2019: Stage IV (yTX, N1, M1b, PSA: 10 to 19, Gleason 8-10) - Signed by Doreatha Massed, MD on 12/26/2019    Prior Therapy: 1. XRT 05/27/2013 - 07/28/2013  2. Lupron 03/2013 - 09/28/14 3. Abiraterone 12/24/19 - 02/16/20 4. enzalutamide 80 mg QD 03/10/20 - 10/17/22  Current Therapy:  cabazitaxel; Lupron every 6 months    HISTORY OF PRESENT ILLNESS:   Oncology History  Prostate cancer (HCC)  09/24/2012 Initial Diagnosis   Prostate cancer (HCC)   12/26/2019 Cancer Staging   Staging form: Prostate, AJCC 7th Edition - Clinical stage from 12/26/2019: Stage IV (yTX, N1, M1b, PSA: 10 to 19, Gleason 8-10) - Signed by Doreatha Massed, MD on 12/26/2019   08/24/2020 Genetic Testing   Positive genetic testing:  A single, 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 Testing was completed through the Common Hereditary Cancers panel and Prostate Cancer HRR panel offered by [facility] laboratories. The report date is 08/24/2020.   The Common Hereditary Cancers Panel offered by [facility] includes sequencing and/or deletion duplication testing of the following 47 genes: APC, ATM, AXIN2, BARD1, BMPR1A, BRCA1, BRCA2, BRIP1, CDH1, CDK4, CDKN2A (p14ARF), CDKN2A (p16INK4a), CHEK2, CTNNA1, DICER1, EPCAM (Deletion/duplication testing only), GREM1 (promoter region deletion/duplication testing only), KIT, MEN1, MLH1, MSH2, MSH3, MSH6, MUTYH, NBN, NF1, NTHL1, PALB2, PDGFRA, PMS2, POLD1, POLE, PTEN, RAD50, RAD51C, RAD51D, SDHB, SDHC, SDHD, SMAD4, SMARCA4. STK11, TP53, TSC1, TSC2, and VHL.  The following genes were evaluated for sequence changes only: SDHA and HOXB13 c.251G>A variant only. The Prostate Cancer HRR Panel offered by [facility] includes sequencing and/or deletion/duplication analysis of the following 10 genes: ATM, BARD1, BRCA1, BRCA2, BRIP1, CHEK2,  FANCL, PALB2, RAD51C, and RAD51D.   11/21/2022 -  Chemotherapy   Patient is on Treatment Plan : PROSTATE Cabazitaxel (20) D1 + Prednisone D1-21 q21d        INTERVAL HISTORY:   Terrelle is a 72 y.o. male presenting to clinic today for follow up of castration resistant prostate cancer. He was last seen by me on 01/23/23.  Today, he states that he is doing well overall. His appetite level is at 100%. His energy level is at 75%.  PAST MEDICAL HISTORY:   Past Medical History: Past Medical History:  Diagnosis Date   Anxiety    Bilateral hydronephrosis    Bladder incontinence    night time,  past 2 months   BPH (benign prostatic hypertrophy) with urinary obstruction    Depression    ED (erectile dysfunction)    H/O ascites    trace   Hypertension    Port-A-Cath in place 11/13/2022   Prostate cancer (HCC) 09/24/2012   gleason 4+3=7., & 4+4=8,PSA=67.30, volume=33.7cc    Surgical History: Past Surgical History:  Procedure Laterality Date   IR IMAGING GUIDED PORT INSERTION  11/16/2022   PROSTATE BIOPSY  09/24/12   Adenocarcinoma   PROSTATE BIOPSY N/A 12/20/2017   Procedure: BIOPSY TRANSRECTAL ULTRASONIC PROSTATE (TUBP);  Surgeon: Marcine Matar, MD;  Location: [facility];  Service: Urology;  Laterality: N/A;   TONSILLECTOMY      Social History: Social History  Socioeconomic History   Marital status: Single    Spouse name: Not on file   Number of children: 0   Years of education: Not on file   Highest education level: Not on file  Occupational History   Occupation: [name]    Employer: [employer]  Tobacco Use   Smoking status: Never   Smokeless tobacco: Never  Vaping Use   Vaping Use: Never used  Substance and Sexual Activity   Alcohol use: Yes    Alcohol/week: 2.0 standard drinks of alcohol    Types: 2 Cans of beer per week    Comment: 3 per day, beer/wine , hx etoh abuse years ago   Drug use: No    Comment: hx   Sexual activity: Yes    Birth  control/protection: Condom  Other Topics Concern   Not on file  Social History Narrative   Not on file   Social Determinants of Health   Financial Resource Strain: Low Risk  (12/08/2019)   Overall Financial Resource Strain (CARDIA)    Difficulty of Paying Living Expenses: Not very hard  Food Insecurity: No Food Insecurity (12/08/2019)   Hunger Vital Sign    Worried About Running Out of Food in the Last Year: Never true    Ran Out of Food in the Last Year: Never true  Transportation Needs: No Transportation Needs (12/08/2019)   PRAPARE - Administrator, Civil Service (Medical): No    Lack of Transportation (Non-Medical): No  Physical Activity: Sufficiently Active (12/08/2019)   Exercise Vital Sign    Days of Exercise per Week: 6 days    Minutes of Exercise per Session: 60 min  Stress: Stress Concern Present (12/08/2019)   [employer] of Occupational Health - Occupational Stress Questionnaire    Feeling of Stress : To some extent  Social Connections: Unknown (12/08/2019)   Social Connection and Isolation Panel [NHANES]    Frequency of Communication with Friends and Family: Twice a week    Frequency of Social Gatherings with Friends and Family: Once a week    Attends Religious Services: More than 4 times per year    Active Member of [employer] or Organizations: No    Attends Engineer, structural: More than 4 times per year    Marital Status: Not on file  Intimate Partner Violence: Not At Risk (12/08/2019)   Humiliation, Afraid, Rape, and Kick questionnaire    Fear of Current or Ex-Partner: No    Emotionally Abused: No    Physically Abused: No    Sexually Abused: No    Family History: Family History  Problem Relation Age of Onset   Depression Mother    Dementia Mother    Anxiety disorder Mother    Prostate cancer Father        unconfirmed diagnosis    Current Medications:  Current Outpatient Medications:    Ascorbic Acid (VITAMIN C) 1000 MG tablet, Take 1,000  mg by mouth daily., Disp: , Rfl:    b complex vitamins capsule, Take 1 capsule by mouth daily., Disp: , Rfl:    Bacillus Coagulans-Inulin (PROBIOTIC) 1-250 BILLION-MG CAPS, Take by mouth daily. , Disp: , Rfl:    Cabazitaxel (JEVTANA IV), Inject into the vein every 21 ( twenty-one) days., Disp: , Rfl:    Cholecalciferol 125 MCG (5000 UT) capsule, Take 5,000 Units by mouth daily. , Disp: , Rfl:    Ginseng 100 MG CAPS, Take by mouth daily. , Disp: , Rfl:  L-ARGININE PO, Take 1,000 mg by mouth daily. , Disp: , Rfl:    lidocaine-prilocaine (EMLA) cream, Apply a small amount to port a cath site and cover with plastic wrap one hour prior to infusion appointments, Disp: 30 g, Rfl: 3   losartan (COZAAR) 25 MG tablet, TAKE 1 TABLET(25 MG) BY MOUTH DAILY, Disp: 90 tablet, Rfl: 3   melatonin 1 MG TABS tablet, Take 2 mg by mouth at bedtime. , Disp: , Rfl:    Probiotic Product (ALIGN) 4 MG CAPS, Take 1 capsule by mouth daily. , Disp: , Rfl:    tamsulosin (FLOMAX) 0.4 MG CAPS capsule, TAKE 1 CAPSULE(0.4 MG) BY MOUTH DAILY, Disp: 90 capsule, Rfl: 3   zinc gluconate 50 MG tablet, Take 50 mg by mouth daily., Disp: , Rfl:    methylphenidate (RITALIN) 5 MG tablet, Take 0.5 tablets (2.5 mg total) by mouth daily as needed. (Patient not taking: Reported on 02/13/2023), Disp: 10 tablet, Rfl: 0   prochlorperazine (COMPAZINE) 10 MG tablet, Take 1 tablet (10 mg total) by mouth every 6 (six) hours as needed for nausea or vomiting. (Patient not taking: Reported on 02/13/2023), Disp: 60 tablet, Rfl: 1 No current facility-administered medications for this visit.  Facility-Administered Medications Ordered in Other Visits:    fulvestrant (FASLODEX) 250 MG/5ML injection, , , ,    sodium chloride flush (NS) 0.9 % injection 10 mL, 10 mL, Intracatheter, PRN, Doreatha Massed, MD, 10 mL at 02/13/23 1625   Allergies: No Known Allergies  REVIEW OF SYSTEMS:   Review of Systems  Constitutional:  Negative for chills, fatigue and  fever.  HENT:   Negative for lump/mass, mouth sores, nosebleeds, sore throat and trouble swallowing.   Eyes:  Negative for eye problems.  Respiratory:  Negative for cough and shortness of breath.   Cardiovascular:  Negative for chest pain, leg swelling and palpitations.  Gastrointestinal:  Positive for constipation and nausea. Negative for abdominal pain, diarrhea and vomiting.  Genitourinary:  Negative for bladder incontinence, difficulty urinating, dysuria, frequency, hematuria and nocturia.   Musculoskeletal:  Negative for arthralgias, back pain, flank pain, myalgias and neck pain.  Skin:  Negative for itching and rash.  Neurological:  Positive for headaches and numbness. Negative for dizziness.  Hematological:  Does not bruise/bleed easily.  Psychiatric/Behavioral:  Positive for depression and sleep disturbance. Negative for suicidal ideas. The patient is nervous/anxious.   All other systems reviewed and are negative.    VITALS:   Blood pressure 136/89, pulse 79, temperature 98 F (36.7 C), temperature source Oral, resp. rate 18, weight 146 lb (66.2 kg), SpO2 98 %.  Wt Readings from Last 3 Encounters:  02/13/23 146 lb (66.2 kg)  02/12/23 145 lb 9.6 oz (66 kg)  01/23/23 145 lb 9.6 oz (66 kg)    Body mass index is 19.8 kg/m.  Performance status (ECOG): 1 - Symptomatic but completely ambulatory  PHYSICAL EXAM:   Physical Exam Vitals and nursing note reviewed. Exam conducted with a chaperone present.  Constitutional:      Appearance: Normal appearance.  Cardiovascular:     Rate and Rhythm: Normal rate and regular rhythm.     Pulses: Normal pulses.     Heart sounds: Normal heart sounds.  Pulmonary:     Effort: Pulmonary effort is normal.     Breath sounds: Normal breath sounds.  Abdominal:     Palpations: Abdomen is soft. There is no hepatomegaly, splenomegaly or mass.     Tenderness: There is no abdominal tenderness.  Musculoskeletal:     Right lower leg: No edema.      Left lower leg: No edema.  Lymphadenopathy:     Cervical: No cervical adenopathy.     Right cervical: No superficial, deep or posterior cervical adenopathy.    Left cervical: No superficial, deep or posterior cervical adenopathy.     Upper Body:     Right upper body: No supraclavicular or axillary adenopathy.     Left upper body: No supraclavicular or axillary adenopathy.  Neurological:     General: No focal deficit present.     Mental Status: He is alert and oriented to person, place, and time.  Psychiatric:        Mood and Affect: Mood normal.        Behavior: Behavior normal.     LABS:      Latest Ref Rng & Units 02/12/2023    2:05 PM 01/23/2023    8:56 AM 01/04/2023   10:00 AM  CBC  WBC 4.0 - 10.5 K/uL 3.5  3.4  4.1   Hemoglobin 13.0 - 17.0 g/dL 16.1  09.6  04.5   Hematocrit 39.0 - 52.0 % 40.7  41.8  41.4   Platelets 150 - 400 K/uL 285  252  264       Latest Ref Rng & Units 02/12/2023    2:05 PM 01/23/2023    8:56 AM 01/04/2023   10:00 AM  CMP  Glucose 70 - 99 mg/dL 88  92  98   BUN 8 - 23 mg/dL 13  15  14    Creatinine 0.61 - 1.24 mg/dL 4.09  8.11  9.14   Sodium 135 - 145 mmol/L 138  137  137   Potassium 3.5 - 5.1 mmol/L 4.2  4.1  4.4   Chloride 98 - 111 mmol/L 102  101  102   CO2 22 - 32 mmol/L 27  27  28    Calcium 8.9 - 10.3 mg/dL 9.2  9.0  9.3   Total Protein 6.5 - 8.1 g/dL 6.7  6.7  6.5   Total Bilirubin 0.3 - 1.2 mg/dL 0.4  0.9  0.6   Alkaline Phos 38 - 126 U/L 49  49  51   AST 15 - 41 U/L 20  22  20    ALT 0 - 44 U/L 14  16  14       No results found for: "CEA1", "CEA" / No results found for: "CEA1", "CEA" Lab Results  Component Value Date   PSA1 0.3 08/30/2021   No results found for: "NWG956" No results found for: "CAN125"  No results found for: "TOTALPROTELP", "ALBUMINELP", "A1GS", "A2GS", "BETS", "BETA2SER", "GAMS", "MSPIKE", "SPEI" No results found for: "TIBC", "FERRITIN", "IRONPCTSAT" Lab Results  Component Value Date   LDH 129 03/22/2020      STUDIES:   No results found.

## 2023-02-14 ENCOUNTER — Inpatient Hospital Stay: Payer: Medicare Other

## 2023-02-14 ENCOUNTER — Inpatient Hospital Stay: Payer: Medicare Other | Admitting: Hematology

## 2023-02-14 ENCOUNTER — Other Ambulatory Visit: Payer: Self-pay

## 2023-02-20 ENCOUNTER — Encounter: Payer: Self-pay | Admitting: Behavioral Health

## 2023-02-20 ENCOUNTER — Ambulatory Visit (INDEPENDENT_AMBULATORY_CARE_PROVIDER_SITE_OTHER): Payer: Medicare Other | Admitting: Behavioral Health

## 2023-02-20 DIAGNOSIS — F4010 Social phobia, unspecified: Secondary | ICD-10-CM

## 2023-02-20 DIAGNOSIS — F411 Generalized anxiety disorder: Secondary | ICD-10-CM | POA: Diagnosis not present

## 2023-02-20 DIAGNOSIS — F4321 Adjustment disorder with depressed mood: Secondary | ICD-10-CM

## 2023-02-20 NOTE — Progress Notes (Signed)
[facility] Counselor/Therapist Progress Note  Patient ID: Nathan Smith, MRN: [id number],    Date: 02/20/2023  Time Spent: 54 minutes, from 2:00 PM to 2:54 PM.  This session was held via video teletherapy. The patient consented to the video teletherapy and was located in his home during this session. He is aware it is the responsibility of the patient to secure confidentiality on his end of the session. The provider was in and outpatient therapy office for the duration of this session.    Treatment Type: Individual Therapy  Reported Symptoms: anxiety  Mental Status Exam: Appearance:  Well Groomed     Behavior: Appropriate  Motor: Normal  Speech/Language:  Clear and Coherent  Affect: Appropriate  Mood: anxious  Thought process: normal  Thought content:   WNL  Sensory/Perceptual disturbances:   WNL  Orientation: oriented to person, place, time/date, situation, day of week, and month of year  Attention: Good  Concentration: Good  Memory: WNL  Fund of knowledge:  Good  Insight:   Good  Judgment:  Good  Impulse Control: Good   Risk Assessment: Danger to Self:  No Self-injurious Behavior: No Danger to Others: No Duty to Warn:no Physical Aggression / Violence:No  Access to Firearms a concern: No  Gang Involvement:No   Subjective: The patient continues to receive treatment but so far is not responding well to treatment.  The last scans that showed that even with the increased chemo treatment there was some slowing down the progression of the cancer but it was still progressing.  Tomorrow he goes for an injection which she gets every 6 months.  He said for the most part he does not feel great after any treatment but somewhat better than others.  There are days where he can do nothing to lay down or sleep.  For the most part he is still going to church on Wednesday nights for praise team practice and on Sunday mornings but says a lot of Sundays he is exhausted after an  hour or so of church.  We talked about how the church is supporting him.  I encouraged him to speak to the cancer Center about the possibility of meeting with a support group or a chaplain or social worker there if there was 1.  I will meet with him every 2 to 3 weeks.  He expresses anger, irritability, sadness, grief, frustration, anxiety.  He says no one at the cancer Center has ever validated that was okay so we talked about a range of emotions going through a traumatic in his case especially long-term event feels like to him.  We looked at what his anger is rooted and interacted as well as grief and sadness confusion etc.  I encouraged expression of all of the emotions and validated their legitimacy in this.  Basically his options now are to continue treatment and see if it can slow the progression or to come off of treatment which she said would result in death but he is not sure how quickly.  He said it could eventually spread to the brain which creates fear for him.  He would be treated with morphine to be kept as comfortable as possible but he says he does not like that option.  He wants to keep fighting which I encouraged.  He says that a former doctor as well as a Optician, dispensing both counseled him to come off of treatment which he was shocked by.  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  We reviewed coping skills for anxiety grief depression and irritability.  We practiced mindfulness and compartmentalization as ways to alleviate those.  He does  contract for safety having no thoughts of hurting himself or anyone else.  Interventions: Cognitive Behavioral Therapy  Diagnosis: Generalized anxiety disorder, social anxiety disorder  Plan: I will meet with the patient every 3 to 4 weeks preferably in office. Treatment plan: We will use cognitive behavioral therapy as well as elements of dialectical behavior therapy and person centered therapy to help reduce the patient's anxiety by at least 50% by July 07, 2023 as well as support him as he goes to intensive chemotherapy for cancer.  Goals will be to improve his ability to manage anxiety symptoms better, resolve issues contributing to his anxiety including his past and treatment that he is going through as well as helping him manage thoughts and worrisome thinking contributing to feelings of anxiety.  We will facilitate problem solution skills to help him manage anxiety and stress, teach coping skills for managing anxiety and stress as well as using cognitive behavioral therapy to identify and change anxiety provoking thoughts and behavior patterns.  We will also teach DBT distress tolerance and mindfulness skills to help him learn better anxiety management.  Progress: 25% [name], [facility]                 [name], [facility]

## 2023-02-21 ENCOUNTER — Inpatient Hospital Stay: Payer: Medicare Other

## 2023-02-21 VITALS — BP 131/77 | HR 77 | Temp 98.0°F | Resp 18 | Wt 143.6 lb

## 2023-02-21 DIAGNOSIS — C61 Malignant neoplasm of prostate: Secondary | ICD-10-CM

## 2023-02-21 DIAGNOSIS — Z5111 Encounter for antineoplastic chemotherapy: Secondary | ICD-10-CM | POA: Diagnosis not present

## 2023-02-21 MED ORDER — LEUPROLIDE ACETATE (6 MONTH) 45 MG ~~LOC~~ KIT
45.0000 mg | PACK | Freq: Once | SUBCUTANEOUS | Status: AC
  Administered 2023-02-21: 45 mg via SUBCUTANEOUS
  Filled 2023-02-21: qty 45

## 2023-02-21 NOTE — Patient Instructions (Signed)
[facility]  Discharge Instructions: Thank you for choosing [facility] to provide your oncology and hematology care.  If you have a lab appointment with the Cancer Center - please note that after April 8th, 2024, all labs will be drawn in the cancer center.  You do not have to check in or register with the main entrance as you have in the past but will complete your check-in in the cancer center.  Wear comfortable clothing and clothing appropriate for easy access to any Portacath or PICC line.   We strive to give you quality time with your provider. You may need to reschedule your appointment if you arrive late (15 or more minutes).  Arriving late affects you and other patients whose appointments are after yours.  Also, if you miss three or more appointments without notifying the office, you may be dismissed from the clinic at the provider's discretion.      For prescription refill requests, have your pharmacy contact our office and allow 72 hours for refills to be completed.    Today you received the following chemotherapy and/or immunotherapy agents eligard       To help prevent nausea and vomiting after your treatment, we encourage you to take your nausea medication as directed.  BELOW ARE SYMPTOMS THAT SHOULD BE REPORTED IMMEDIATELY: *FEVER GREATER THAN 100.4 F (38 C) OR HIGHER *CHILLS OR SWEATING *NAUSEA AND VOMITING THAT IS NOT CONTROLLED WITH YOUR NAUSEA MEDICATION *UNUSUAL SHORTNESS OF BREATH *UNUSUAL BRUISING OR BLEEDING *URINARY PROBLEMS (pain or burning when urinating, or frequent urination) *BOWEL PROBLEMS (unusual diarrhea, constipation, pain near the anus) TENDERNESS IN MOUTH AND THROAT WITH OR WITHOUT PRESENCE OF ULCERS (sore throat, sores in mouth, or a toothache) UNUSUAL RASH, SWELLING OR PAIN  UNUSUAL VAGINAL DISCHARGE OR ITCHING   Items with * indicate a potential emergency and should be followed up as soon as possible or go to the  Emergency Department if any problems should occur.  Please show the CHEMOTHERAPY ALERT CARD or IMMUNOTHERAPY ALERT CARD at check-in to the Emergency Department and triage nurse.  Should you have questions after your visit or need to cancel or reschedule your appointment, please contact [facility] [place] [phone]  and follow the prompts.  Office hours are 8:00 a.m. to 4:30 p.m. Monday - Friday. Please note that voicemails left after 4:00 p.m. may not be returned until the following business day.  We are closed weekends and major holidays. You have access to a nurse at all times for urgent questions. Please call the main number to the clinic [phone] and follow the prompts.  For any non-urgent questions, you may also contact your provider using MyChart. We now offer e-Visits for anyone 97 and older to request care online for non-urgent symptoms. For details visit [email or web address].   Also download the MyChart app! Go to the app store, search "MyChart", open the app, select [facility], and log in with your MyChart username and password.

## 2023-02-21 NOTE — Progress Notes (Signed)
Patient tolerated injection with no complaints voiced.  Site clean and dry with no bruising or swelling noted at site.  See MAR for details.  Band aid applied.  Patient stable during and after injection.  Vss with discharge and left in satisfactory condition with no s/s of distress noted.  

## 2023-03-05 ENCOUNTER — Inpatient Hospital Stay: Payer: Medicare Other

## 2023-03-05 DIAGNOSIS — Z5111 Encounter for antineoplastic chemotherapy: Secondary | ICD-10-CM | POA: Diagnosis not present

## 2023-03-05 LAB — CBC WITH DIFFERENTIAL/PLATELET
Abs Immature Granulocytes: 0.02 10*3/uL (ref 0.00–0.07)
Basophils Absolute: 0.1 10*3/uL (ref 0.0–0.1)
Basophils Relative: 1 %
Eosinophils Absolute: 0 10*3/uL (ref 0.0–0.5)
Eosinophils Relative: 1 %
HCT: 42.3 % (ref 39.0–52.0)
Hemoglobin: 13.8 g/dL (ref 13.0–17.0)
Immature Granulocytes: 1 %
Lymphocytes Relative: 35 %
Lymphs Abs: 1.2 10*3/uL (ref 0.7–4.0)
MCH: 32.4 pg (ref 26.0–34.0)
MCHC: 32.6 g/dL (ref 30.0–36.0)
MCV: 99.3 fL (ref 80.0–100.0)
Monocytes Absolute: 0.6 10*3/uL (ref 0.1–1.0)
Monocytes Relative: 17 %
Neutro Abs: 1.6 10*3/uL — ABNORMAL LOW (ref 1.7–7.7)
Neutrophils Relative %: 45 %
Platelets: 272 10*3/uL (ref 150–400)
RBC: 4.26 MIL/uL (ref 4.22–5.81)
RDW: 14 % (ref 11.5–15.5)
WBC: 3.6 10*3/uL — ABNORMAL LOW (ref 4.0–10.5)
nRBC: 0 % (ref 0.0–0.2)

## 2023-03-05 LAB — COMPREHENSIVE METABOLIC PANEL
ALT: 15 U/L (ref 0–44)
AST: 20 U/L (ref 15–41)
Albumin: 4 g/dL (ref 3.5–5.0)
Alkaline Phosphatase: 53 U/L (ref 38–126)
Anion gap: 6 (ref 5–15)
BUN: 12 mg/dL (ref 8–23)
CO2: 28 mmol/L (ref 22–32)
Calcium: 9 mg/dL (ref 8.9–10.3)
Chloride: 102 mmol/L (ref 98–111)
Creatinine, Ser: 1.04 mg/dL (ref 0.61–1.24)
GFR, Estimated: >60 mL/min (ref >60)
Glucose, Bld: 101 mg/dL — ABNORMAL HIGH (ref 70–99)
Potassium: 4.3 mmol/L (ref 3.5–5.1)
Sodium: 136 mmol/L (ref 135–145)
Total Bilirubin: 0.8 mg/dL (ref 0.3–1.2)
Total Protein: 6.9 g/dL (ref 6.5–8.1)

## 2023-03-05 LAB — MAGNESIUM: Magnesium: 2.3 mg/dL (ref 1.7–2.4)

## 2023-03-05 LAB — PSA: Prostatic Specific Antigen: 7.79 ng/mL — ABNORMAL HIGH (ref 0.00–4.00)
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
## 2023-03-06 ENCOUNTER — Inpatient Hospital Stay (HOSPITAL_BASED_OUTPATIENT_CLINIC_OR_DEPARTMENT_OTHER): Payer: Medicare Other | Admitting: Hematology

## 2023-03-06 ENCOUNTER — Inpatient Hospital Stay: Payer: Medicare Other

## 2023-03-06 VITALS — BP 118/69 | HR 60 | Temp 97.6°F | Resp 18

## 2023-03-06 VITALS — BP 124/83 | HR 82 | Temp 98.2°F | Resp 18 | Wt 145.4 lb

## 2023-03-06 DIAGNOSIS — Z95828 Presence of other vascular implants and grafts: Secondary | ICD-10-CM

## 2023-03-06 DIAGNOSIS — C61 Malignant neoplasm of prostate: Secondary | ICD-10-CM

## 2023-03-06 DIAGNOSIS — Z5111 Encounter for antineoplastic chemotherapy: Secondary | ICD-10-CM | POA: Diagnosis not present

## 2023-03-06 MED ORDER — SODIUM CHLORIDE 0.9 % IV SOLN: Freq: Once | INTRAVENOUS | Status: AC

## 2023-03-06 MED ORDER — CETIRIZINE HCL 10 MG/ML IV SOLN
10.0000 mg | Freq: Once | INTRAVENOUS | Status: AC
  Administered 2023-03-06: 10 mg via INTRAVENOUS
  Filled 2023-03-06: qty 1

## 2023-03-06 MED ORDER — PALONOSETRON HCL INJECTION 0.25 MG/5ML
0.2500 mg | Freq: Once | INTRAVENOUS | Status: AC
  Administered 2023-03-06: 0.25 mg via INTRAVENOUS
  Filled 2023-03-06: qty 5

## 2023-03-06 MED ORDER — HEPARIN SOD (PORK) LOCK FLUSH 100 UNIT/ML IV SOLN
500.0000 [IU] | Freq: Once | INTRAVENOUS | Status: AC | PRN
  Administered 2023-03-06: 500 [IU]

## 2023-03-06 MED ORDER — SODIUM CHLORIDE 0.9 % IV SOLN
20.0000 mg/m2 | Freq: Once | INTRAVENOUS | Status: AC
  Administered 2023-03-06: 36 mg via INTRAVENOUS
  Filled 2023-03-06: qty 3.6

## 2023-03-06 MED ORDER — SODIUM CHLORIDE 0.9 % IV SOLN
10.0000 mg | Freq: Once | INTRAVENOUS | Status: AC
  Administered 2023-03-06: 10 mg via INTRAVENOUS
  Filled 2023-03-06: qty 10

## 2023-03-06 MED ORDER — SODIUM CHLORIDE 0.9% FLUSH
10.0000 mL | INTRAVENOUS | Status: DC | PRN
  Administered 2023-03-06: 10 mL

## 2023-03-06 MED ORDER — SODIUM CHLORIDE 0.9 % IV SOLN
150.0000 mg | Freq: Once | INTRAVENOUS | Status: AC
  Administered 2023-03-06: 150 mg via INTRAVENOUS
  Filled 2023-03-06: qty 150

## 2023-03-06 MED ORDER — FAMOTIDINE IN NACL 20-0.9 MG/50ML-% IV SOLN
20.0000 mg | Freq: Once | INTRAVENOUS | Status: AC
  Administered 2023-03-06: 20 mg via INTRAVENOUS
  Filled 2023-03-06: qty 50

## 2023-03-06 NOTE — Progress Notes (Signed)
Patient presents today for chemotherapy infusion of Jevtana. Patient is in satisfactory condition with no new complaints voiced.  Vital signs are stable.  Labs reviewed by Dr. Ellin Saba during the office visit and all labs are within treatment parameters.  We will proceed with treatment per MD orders.

## 2023-03-06 NOTE — Patient Instructions (Signed)

## 2023-03-06 NOTE — Progress Notes (Signed)
Treatment given today per MD orders.  Stable during infusion without adverse affects.  Vital signs stable.  No complaints at this time.  Discharge from clinic ambulatory in stable condition.  Alert and oriented X 3.  Follow up with [facility] as scheduled.  

## 2023-03-06 NOTE — Progress Notes (Signed)
Patient has been examined by Dr. Katragadda. Vital signs and labs have been reviewed by MD - ANC, Creatinine, LFTs, hemoglobin, and platelets are within treatment parameters per M.D. - pt may proceed with treatment.  Primary RN and pharmacy notified.  

## 2023-03-06 NOTE — Patient Instructions (Signed)
[facility]  Discharge Instructions: Thank you for choosing [facility] to provide your oncology and hematology care.  If you have a lab appointment with the Cancer Center - please note that after April 8th, 2024, all labs will be drawn in the cancer center.  You do not have to check in or register with the main entrance as you have in the past but will complete your check-in in the cancer center.  Wear comfortable clothing and clothing appropriate for easy access to any Portacath or PICC line.   We strive to give you quality time with your provider. You may need to reschedule your appointment if you arrive late (15 or more minutes).  Arriving late affects you and other patients whose appointments are after yours.  Also, if you miss three or more appointments without notifying the office, you may be dismissed from the clinic at the provider's discretion.      For prescription refill requests, have your pharmacy contact our office and allow 72 hours for refills to be completed.    Today you received the following chemotherapy and/or immunotherapy agents Jevtana      To help prevent nausea and vomiting after your treatment, we encourage you to take your nausea medication as directed.  BELOW ARE SYMPTOMS THAT SHOULD BE REPORTED IMMEDIATELY: *FEVER GREATER THAN 100.4 F (38 C) OR HIGHER *CHILLS OR SWEATING *NAUSEA AND VOMITING THAT IS NOT CONTROLLED WITH YOUR NAUSEA MEDICATION *UNUSUAL SHORTNESS OF BREATH *UNUSUAL BRUISING OR BLEEDING *URINARY PROBLEMS (pain or burning when urinating, or frequent urination) *BOWEL PROBLEMS (unusual diarrhea, constipation, pain near the anus) TENDERNESS IN MOUTH AND THROAT WITH OR WITHOUT PRESENCE OF ULCERS (sore throat, sores in mouth, or a toothache) UNUSUAL RASH, SWELLING OR PAIN  UNUSUAL VAGINAL DISCHARGE OR ITCHING   Items with * indicate a potential emergency and should be followed up as soon as possible or go to the  Emergency Department if any problems should occur.  Please show the CHEMOTHERAPY ALERT CARD or IMMUNOTHERAPY ALERT CARD at check-in to the Emergency Department and triage nurse.  Should you have questions after your visit or need to cancel or reschedule your appointment, please contact [facility] [phone]  and follow the prompts.  Office hours are 8:00 a.m. to 4:30 p.m. Monday - Friday. Please note that voicemails left after 4:00 p.m. may not be returned until the following business day.  We are closed weekends and major holidays. You have access to a nurse at all times for urgent questions. Please call the main number to the clinic [phone] and follow the prompts.  For any non-urgent questions, you may also contact your provider using MyChart. We now offer e-Visits for anyone 22 and older to request care online for non-urgent symptoms. For details visit [email or web address].   Also download the MyChart app! Go to the app store, search "MyChart", open the app, select [facility], and log in with your MyChart username and password.

## 2023-03-07 ENCOUNTER — Inpatient Hospital Stay: Payer: Medicare Other | Admitting: Hematology

## 2023-03-07 ENCOUNTER — Inpatient Hospital Stay: Payer: Medicare Other

## 2023-03-15 ENCOUNTER — Ambulatory Visit (INDEPENDENT_AMBULATORY_CARE_PROVIDER_SITE_OTHER): Payer: Medicare Other | Admitting: Behavioral Health

## 2023-03-15 ENCOUNTER — Encounter: Payer: Self-pay | Admitting: Behavioral Health

## 2023-03-15 DIAGNOSIS — F411 Generalized anxiety disorder: Secondary | ICD-10-CM

## 2023-03-15 DIAGNOSIS — F4010 Social phobia, unspecified: Secondary | ICD-10-CM

## 2023-03-15 DIAGNOSIS — F330 Major depressive disorder, recurrent, mild: Secondary | ICD-10-CM
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
## 2023-03-22 ENCOUNTER — Inpatient Hospital Stay: Payer: Medicare Other | Attending: Hematology

## 2023-03-22 DIAGNOSIS — Z8042 Family history of malignant neoplasm of prostate: Secondary | ICD-10-CM | POA: Diagnosis not present

## 2023-03-22 DIAGNOSIS — C7951 Secondary malignant neoplasm of bone: Secondary | ICD-10-CM | POA: Diagnosis present

## 2023-03-22 DIAGNOSIS — Z923 Personal history of irradiation: Secondary | ICD-10-CM | POA: Insufficient documentation

## 2023-03-22 DIAGNOSIS — Z5111 Encounter for antineoplastic chemotherapy: Secondary | ICD-10-CM | POA: Diagnosis present

## 2023-03-22 DIAGNOSIS — Z79899 Other long term (current) drug therapy: Secondary | ICD-10-CM | POA: Diagnosis not present

## 2023-03-22 DIAGNOSIS — R110 Nausea: Secondary | ICD-10-CM | POA: Insufficient documentation

## 2023-03-22 DIAGNOSIS — R530 Neoplastic (malignant) related fatigue: Secondary | ICD-10-CM | POA: Insufficient documentation

## 2023-03-22 DIAGNOSIS — Z95828 Presence of other vascular implants and grafts: Secondary | ICD-10-CM

## 2023-03-22 DIAGNOSIS — C61 Malignant neoplasm of prostate: Secondary | ICD-10-CM | POA: Insufficient documentation

## 2023-03-22 DIAGNOSIS — I10 Essential (primary) hypertension: Secondary | ICD-10-CM | POA: Diagnosis not present

## 2023-03-22 DIAGNOSIS — Z1509 Genetic susceptibility to other malignant neoplasm: Secondary | ICD-10-CM | POA: Diagnosis not present

## 2023-03-22 DIAGNOSIS — K5900 Constipation, unspecified: Secondary | ICD-10-CM | POA: Diagnosis not present

## 2023-03-22 LAB — CBC WITH DIFFERENTIAL/PLATELET
Abs Immature Granulocytes: 0.01 10*3/uL (ref 0.00–0.07)
Basophils Absolute: 0 10*3/uL (ref 0.0–0.1)
Basophils Relative: 1 %
Eosinophils Absolute: 0 10*3/uL (ref 0.0–0.5)
Eosinophils Relative: 1 %
HCT: 40 % (ref 39.0–52.0)
Hemoglobin: 12.8 g/dL — ABNORMAL LOW (ref 13.0–17.0)
Immature Granulocytes: 1 %
Lymphocytes Relative: 40 %
Lymphs Abs: 0.8 10*3/uL (ref 0.7–4.0)
MCH: 31.8 pg (ref 26.0–34.0)
MCHC: 32 g/dL (ref 30.0–36.0)
MCV: 99.5 fL (ref 80.0–100.0)
Monocytes Absolute: 0.4 10*3/uL (ref 0.1–1.0)
Monocytes Relative: 20 %
Neutro Abs: 0.8 10*3/uL — ABNORMAL LOW (ref 1.7–7.7)
Neutrophils Relative %: 37 %
Platelets: 250 10*3/uL (ref 150–400)
RBC: 4.02 MIL/uL — ABNORMAL LOW (ref 4.22–5.81)
RDW: 14.3 % (ref 11.5–15.5)
WBC: 2.1 10*3/uL — ABNORMAL LOW (ref 4.0–10.5)
nRBC: 0 % (ref 0.0–0.2)

## 2023-03-22 LAB — COMPREHENSIVE METABOLIC PANEL
ALT: 15 U/L (ref 0–44)
AST: 17 U/L (ref 15–41)
Albumin: 3.8 g/dL (ref 3.5–5.0)
Alkaline Phosphatase: 44 U/L (ref 38–126)
Anion gap: 8 (ref 5–15)
BUN: 13 mg/dL (ref 8–23)
CO2: 27 mmol/L (ref 22–32)
Calcium: 8.8 mg/dL — ABNORMAL LOW (ref 8.9–10.3)
Chloride: 101 mmol/L (ref 98–111)
Creatinine, Ser: 0.97 mg/dL (ref 0.61–1.24)
GFR, Estimated: >60 mL/min (ref >60)
Glucose, Bld: 115 mg/dL — ABNORMAL HIGH (ref 70–99)
Potassium: 4.2 mmol/L (ref 3.5–5.1)
Sodium: 136 mmol/L (ref 135–145)
Total Bilirubin: 0.4 mg/dL (ref 0.3–1.2)
Total Protein: 6.3 g/dL — ABNORMAL LOW (ref 6.5–8.1)

## 2023-03-22 LAB — PSA: Prostatic Specific Antigen: 5.66 ng/mL — ABNORMAL HIGH (ref 0.00–4.00)

## 2023-03-22 LAB — MAGNESIUM: Magnesium: 2.1 mg/dL (ref 1.7–2.4)

## 2023-03-26 ENCOUNTER — Other Ambulatory Visit: Payer: Medicare Other

## 2023-03-26 NOTE — Progress Notes (Signed)
[facility] [address], [place] [address]    Clinic Day:  03/27/23    Referring physician: Doreatha Massed, MD  Patient Care Team: Patient, No Pcp Per as PCP - General (General Practice) Doreatha Massed, MD as Consulting Physician (Oncology) Mickie Bail, RN as Oncology Nurse Navigator (Oncology)   ASSESSMENT & PLAN:   Assessment: 1.  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 -External beam radiation with 2 years of Lupron from 05/27/2013 through 07/28/2013.  Received Lupron until 09/28/2014. -Rising PSA levels in December 2018, of 2.8.  Local recurrence was confirmed with biopsy. -Bone scan on 04/12/2018 did not show any evidence of metastatic disease. -He was evaluated by Dr. Greggory Stallion at [facility].  Bone scan and CT scan at [facility] on 07/02/2018 did not show any evidence of metastatic disease.  Left para-aortic lymph node measures 8 mm.  Lupron started for nonmetastatic HSPC in November 2019 for rapid PSA doubling time of less than 3 months.  He had difficulty tolerating Lupron with irritability, low energy and fatigue. -PSA 5.7 (07/08/2019), PSA 11.1 (11/26/2019) with testosterone 18. -Fluciclovine PET CT scan on 12/23/2019 showed T11 meta stasis.  Asymmetric hypermetabolism within the prostate suspicious for residual disease.  Mild enlargement of 2 adjacent lower left para-aortic nodes, largest 8 mm with SUV of 3.4.  These nodes measured maximally 5 mm on PET scan on 02/25/2018. -Abiraterone 500 mg daily and prednisone from 01/03/2020 through 02/16/2020, discontinued secondary to elevated LFTs. -We will consider checking germline and somatic mutation testing. -Lupron 45 mg on 02/17/2020 at Dr. Lenoria Chime office. -Enzalutamide from 03/10/2020 through 10/17/2022 with progression. - Guardant360: MSI high not detected. NOTCH1 mutation. - Recommended cabazitaxel to minimize  incidence of neuropathy as he is a Technical sales engineer and plays guitar. - Cycle 1 cabazitaxel on 11/21/2022   2.  Heterozygosity for MUTYH mutation: - Cologuard test was refused previously.    Plan: 1.  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 - He had more mood/irritability/anxiety issues.  He thinks all of these are coming from Lupron. - Reviewed labs from 03/22/2023: Normal LFTs and creatinine.  PSA trended down to 5.66 from 7.79.  Repeat CBC today shows improved white count of 3.5 with ANC of 1.7. - Proceed with cycle 7 of cabazitaxel at 20 mg/m.   2.  Bone metastasis: - Denosumab was refused previously.  Continue calcium and vitamin D supplements.   3.  Hypertension: - Continue losartan 25 mg daily.  Blood pressure is well-controlled.   4.  Cancer-related fatigue: - Continue on eighth of the pill of Ritalin as needed on Sundays.  Continue ginseng daily.  5.  Nausea: - Taking Compazine makes him feel drowsy. - Will start him on Zofran 4 mg every 8 hours as needed.    Orders Placed This Encounter  Procedures   Magnesium    Standing Status:   Future    Standing Expiration Date:   05/08/2024   PSA    Standing Status:   Future    Standing Expiration Date:   05/08/2024   CBC with Differential    Standing Status:   Future    Standing Expiration Date:   05/08/2024   Comprehensive metabolic panel    Standing Status:   Future    Standing Expiration Date:  05/08/2024   Magnesium    Standing Status:   Future    Standing Expiration Date:   05/29/2024   PSA    Standing Status:   Future    Standing Expiration Date:   05/29/2024   CBC with Differential    Standing Status:   Future    Standing Expiration Date:   05/29/2024   Comprehensive metabolic panel    Standing Status:   Future    Standing Expiration Date:   05/29/2024      Nathan Smith,acting as a scribe for Doreatha Massed, MD.,have documented all relevant documentation on the behalf of Doreatha Massed, MD,as directed by  Doreatha Massed, MD while in the presence of Doreatha Massed, MD.  I, Doreatha Massed MD, have reviewed the above documentation for accuracy and completeness, and I agree with the above.      Doreatha Massed, MD   8/20/20245:53 PM  CHIEF COMPLAINT:   Diagnosis: castration resistant prostate cancer    Cancer Staging  Prostate cancer [facility]) Staging form: Prostate, AJCC 7th Edition - Clinical stage from 12/26/2019: Stage IV (yTX, N1, M1b, PSA: 10 to 19, Gleason 8-10) - Signed by Doreatha Massed, MD on 12/26/2019    Prior Therapy: 1. XRT 05/27/2013 - 07/28/2013  2. Lupron 03/2013 - 09/28/14 3. Abiraterone 12/24/19 - 02/16/20 4. enzalutamide 80 mg QD 03/10/20 - 10/17/22  Current Therapy:  cabazitaxel; Lupron every 6 months    HISTORY OF PRESENT ILLNESS:   Oncology History  Prostate cancer (HCC)  09/24/2012 Initial Diagnosis   Prostate cancer (HCC)   12/26/2019 Cancer Staging   Staging form: Prostate, AJCC 7th Edition - Clinical stage from 12/26/2019: Stage IV (yTX, N1, M1b, PSA: 10 to 19, Gleason 8-10) - Signed by Doreatha Massed, MD on 12/26/2019   08/24/2020 Genetic Testing   Positive genetic testing:  A single, 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 Testing was completed through the Common Hereditary Cancers panel and Prostate Cancer HRR panel offered by [facility] laboratories. The report date is 08/24/2020.   The Common Hereditary Cancers Panel offered by [facility] includes sequencing and/or deletion duplication testing of the following 47 genes: APC, ATM, AXIN2, BARD1, BMPR1A, BRCA1, BRCA2, BRIP1, CDH1, CDK4, CDKN2A (p14ARF), CDKN2A (p16INK4a), CHEK2, CTNNA1, DICER1, EPCAM (Deletion/duplication testing only), GREM1 (promoter region deletion/duplication testing only), KIT, MEN1, MLH1, MSH2, MSH3, MSH6, MUTYH, NBN,  NF1, NTHL1, PALB2, PDGFRA, PMS2, POLD1, POLE, PTEN, RAD50, RAD51C, RAD51D, SDHB, SDHC, SDHD, SMAD4, SMARCA4. STK11, TP53, TSC1, TSC2, and VHL.  The following genes were evaluated for sequence changes only: SDHA and HOXB13 c.251G>A variant only. The Prostate Cancer HRR Panel offered by [facility] includes sequencing and/or deletion/duplication analysis of the following 10 genes: ATM, BARD1, BRCA1, BRCA2, BRIP1, CHEK2, FANCL, PALB2, RAD51C, and RAD51D.   11/21/2022 -  Chemotherapy   Patient is on Treatment Plan : PROSTATE Cabazitaxel (20) D1 + Prednisone D1-21 q21d        INTERVAL HISTORY:   Nathan Smith is a 72 y.o. male presenting to clinic today for follow up of castration resistant prostate cancer. He was last seen by me on 03/06/23.  Today, he states that he is doing well overall. His appetite level is at 100%. His energy level is at 75%.    He notes consistent irritability. He believes Ritalin worsens irritability. His sleep disturbance improved when his dosage of dexamethasone was decreased. He believes his Compazine medication makes him drowsy and only took a quarter of a pill once. His nausea  has improved for the past week, though it is consistent throughout the day. He has never taken Zofran before. He reports mild tingling in hands that has not presented any issues nor impacted his ability to play the guitar.  He notes at least 2 x a week he has episodes of hopelessness, mood swings, depression, anxiety, and no longer wishes to live. He denies suicidal thoughts. He believes this is exacerbated by Lupron. He does not wish to take a pill unless absolutely necessary and currently sees a psychologist.   He notes since he started chemotherapy he has had constipation, though it is now occasional and mild.   He notes he is not a morning person and is unwilling to come in at 8am to have labs done the day before his appointment.   PAST MEDICAL HISTORY:   Past Medical History: Past Medical History:   Diagnosis Date   Anxiety    Bilateral hydronephrosis    Bladder incontinence    night time,  past 2 months   BPH (benign prostatic hypertrophy) with urinary obstruction    Depression    ED (erectile dysfunction)    H/O ascites    trace   Hypertension    Port-A-Cath in place 11/13/2022   Prostate cancer (HCC) 09/24/2012   gleason 4+3=7., & 4+4=8,PSA=67.30, volume=33.7cc    Surgical History: Past Surgical History:  Procedure Laterality Date   IR IMAGING GUIDED PORT INSERTION  11/16/2022   PROSTATE BIOPSY  09/24/12   Adenocarcinoma   PROSTATE BIOPSY N/A 12/20/2017   Procedure: BIOPSY TRANSRECTAL ULTRASONIC PROSTATE (TUBP);  Surgeon: Marcine Matar, MD;  Location: [facility];  Service: Urology;  Laterality: N/A;   TONSILLECTOMY      Social History: Social History   Socioeconomic History   Marital status: Single    Spouse name: Not on file   Number of children: 0   Years of education: Not on file   Highest education level: Not on file  Occupational History   Occupation: [name]    Employer: [employer]  Tobacco Use   Smoking status: Never   Smokeless tobacco: Never  Vaping Use   Vaping status: Never Used  Substance and Sexual Activity   Alcohol use: Yes    Alcohol/week: 2.0 standard drinks of alcohol    Types: 2 Cans of beer per week    Comment: 3 per day, beer/wine , hx etoh abuse years ago   Drug use: No    Comment: hx   Sexual activity: Yes    Birth control/protection: Condom  Other Topics Concern   Not on file  Social History Narrative   Not on file   Social Determinants of Health   Financial Resource Strain: Low Risk  (12/08/2019)   Overall Financial Resource Strain (CARDIA)    Difficulty of Paying Living Expenses: Not very hard  Food Insecurity: No Food Insecurity (12/08/2019)   Hunger Vital Sign    Worried About Running Out of Food in the Last Year: Never true    Ran Out of Food in the Last Year: Never true  Transportation Needs: No  Transportation Needs (12/08/2019)   PRAPARE - Administrator, Civil Service (Medical): No    Lack of Transportation (Non-Medical): No  Physical Activity: Sufficiently Active (12/08/2019)   Exercise Vital Sign    Days of Exercise per Week: 6 days    Minutes of Exercise per Session: 60 min  Stress: Stress Concern Present (12/08/2019)   [employer] of Occupational  Health - Occupational Stress Questionnaire    Feeling of Stress : To some extent  Social Connections: Unknown (12/08/2019)   Social Connection and Isolation Panel [NHANES]    Frequency of Communication with Friends and Family: Twice a week    Frequency of Social Gatherings with Friends and Family: Once a week    Attends Religious Services: More than 4 times per year    Active Member of [employer] or Organizations: No    Attends Engineer, structural: More than 4 times per year    Marital Status: Not on file  Intimate Partner Violence: Not At Risk (12/08/2019)   Humiliation, Afraid, Rape, and Kick questionnaire    Fear of Current or Ex-Partner: No    Emotionally Abused: No    Physically Abused: No    Sexually Abused: No    Family History: Family History  Problem Relation Age of Onset   Depression Mother    Dementia Mother    Anxiety disorder Mother    Prostate cancer Father        unconfirmed diagnosis    Current Medications:  Current Outpatient Medications:    Ascorbic Acid (VITAMIN C) 1000 MG tablet, Take 1,000 mg by mouth daily., Disp: , Rfl:    b complex vitamins capsule, Take 1 capsule by mouth daily., Disp: , Rfl:    Bacillus Coagulans-Inulin (PROBIOTIC) 1-250 BILLION-MG CAPS, Take by mouth daily. , Disp: , Rfl:    Cabazitaxel (JEVTANA IV), Inject into the vein every 21 ( twenty-one) days., Disp: , Rfl:    Cholecalciferol 125 MCG (5000 UT) capsule, Take 5,000 Units by mouth daily. , Disp: , Rfl:    Ginseng 100 MG CAPS, Take by mouth daily. , Disp: , Rfl:    L-ARGININE PO, Take 1,000 mg by mouth  daily. , Disp: , Rfl:    lidocaine-prilocaine (EMLA) cream, Apply a small amount to port a cath site and cover with plastic wrap one hour prior to infusion appointments, Disp: 30 g, Rfl: 3   losartan (COZAAR) 25 MG tablet, TAKE 1 TABLET(25 MG) BY MOUTH DAILY, Disp: 90 tablet, Rfl: 3   melatonin 1 MG TABS tablet, Take 2 mg by mouth at bedtime. , Disp: , Rfl:    methylphenidate (RITALIN) 5 MG tablet, Take 0.5 tablets (2.5 mg total) by mouth daily as needed., Disp: 10 tablet, Rfl: 0   ondansetron (ZOFRAN) 4 MG tablet, Take 1 tablet (4 mg total) by mouth every 8 (eight) hours as needed for nausea or vomiting., Disp: 60 tablet, Rfl: 2   Probiotic Product (ALIGN) 4 MG CAPS, Take 1 capsule by mouth daily. , Disp: , Rfl:    prochlorperazine (COMPAZINE) 10 MG tablet, Take 1 tablet (10 mg total) by mouth every 6 (six) hours as needed for nausea or vomiting., Disp: 60 tablet, Rfl: 1   tamsulosin (FLOMAX) 0.4 MG CAPS capsule, TAKE 1 CAPSULE(0.4 MG) BY MOUTH DAILY, Disp: 90 capsule, Rfl: 3   zinc gluconate 50 MG tablet, Take 50 mg by mouth daily., Disp: , Rfl:  No current facility-administered medications for this visit.  Facility-Administered Medications Ordered in Other Visits:    fulvestrant (FASLODEX) 250 MG/5ML injection, , , ,    sodium chloride flush (NS) 0.9 % injection 10 mL, 10 mL, Intracatheter, PRN, Doreatha Massed, MD, 10 mL at 03/27/23 1546   Allergies: No Known Allergies  REVIEW OF SYSTEMS:   Review of Systems  Constitutional:  Negative for chills, fatigue and fever.  HENT:   Negative  for lump/mass, mouth sores, nosebleeds, sore throat and trouble swallowing.   Eyes:  Negative for eye problems.  Respiratory:  Negative for cough and shortness of breath.   Cardiovascular:  Negative for chest pain, leg swelling and palpitations.  Gastrointestinal:  Positive for nausea (occasional). Negative for abdominal pain, constipation, diarrhea and vomiting.  Genitourinary:  Positive for  frequency (at night). Negative for bladder incontinence, difficulty urinating, dysuria, hematuria and nocturia.   Musculoskeletal:  Negative for arthralgias, back pain, flank pain, myalgias and neck pain.  Skin:  Negative for itching and rash.  Neurological:  Positive for numbness (tingling in hands). Negative for dizziness and headaches.  Hematological:  Does not bruise/bleed easily.  Psychiatric/Behavioral:  Positive for sleep disturbance (occasional). Negative for depression and suicidal ideas. The patient is nervous/anxious.   All other systems reviewed and are negative.    VITALS:   Blood pressure 136/85, pulse 66, temperature 98.1 F (36.7 C), temperature source Oral, resp. rate 17, weight 145 lb 12.8 oz (66.1 kg), SpO2 100%.  Wt Readings from Last 3 Encounters:  03/27/23 145 lb 12.8 oz (66.1 kg)  03/06/23 145 lb 6.4 oz (66 kg)  02/21/23 143 lb 9.6 oz (65.1 kg)    Body mass index is 19.77 kg/m.  Performance status (ECOG): 1 - Symptomatic but completely ambulatory  PHYSICAL EXAM:   Physical Exam Vitals and nursing note reviewed. Exam conducted with a chaperone present.  Constitutional:      Appearance: Normal appearance.  Cardiovascular:     Rate and Rhythm: Normal rate and regular rhythm.     Pulses: Normal pulses.     Heart sounds: Normal heart sounds.  Pulmonary:     Effort: Pulmonary effort is normal.     Breath sounds: Normal breath sounds.  Abdominal:     Palpations: Abdomen is soft. There is no hepatomegaly, splenomegaly or mass.     Tenderness: There is no abdominal tenderness.  Musculoskeletal:     Right lower leg: No edema.     Left lower leg: No edema.  Lymphadenopathy:     Cervical: No cervical adenopathy.     Right cervical: No superficial, deep or posterior cervical adenopathy.    Left cervical: No superficial, deep or posterior cervical adenopathy.     Upper Body:     Right upper body: No supraclavicular or axillary adenopathy.     Left upper body:  No supraclavicular or axillary adenopathy.  Neurological:     General: No focal deficit present.     Mental Status: He is alert and oriented to person, place, and time.  Psychiatric:        Mood and Affect: Mood normal.        Behavior: Behavior normal.     LABS:      Latest Ref Rng & Units 03/27/2023   10:35 AM 03/22/2023    1:48 PM 03/05/2023    2:11 PM  CBC  WBC 4.0 - 10.5 K/uL 3.5  2.1  3.6   Hemoglobin 13.0 - 17.0 g/dL 78.2  95.6  21.3   Hematocrit 39.0 - 52.0 % 42.7  40.0  42.3   Platelets 150 - 400 K/uL 287  250  272       Latest Ref Rng & Units 03/22/2023    1:48 PM 03/05/2023    2:11 PM 02/12/2023    2:05 PM  CMP  Glucose 70 - 99 mg/dL 086  578  88   BUN 8 - 23 mg/dL 13  12  13   Creatinine 0.61 - 1.24 mg/dL 8.41  3.24  4.01   Sodium 135 - 145 mmol/L 136  136  138   Potassium 3.5 - 5.1 mmol/L 4.2  4.3  4.2   Chloride 98 - 111 mmol/L 101  102  102   CO2 22 - 32 mmol/L 27  28  27    Calcium 8.9 - 10.3 mg/dL 8.8  9.0  9.2   Total Protein 6.5 - 8.1 g/dL 6.3  6.9  6.7   Total Bilirubin 0.3 - 1.2 mg/dL 0.4  0.8  0.4   Alkaline Phos 38 - 126 U/L 44  53  49   AST 15 - 41 U/L 17  20  20    ALT 0 - 44 U/L 15  15  14       No results found for: "CEA1", "CEA" / No results found for: "CEA1", "CEA" Lab Results  Component Value Date   PSA1 0.3 08/30/2021   No results found for: "UUV253" No results found for: "CAN125"  No results found for: "TOTALPROTELP", "ALBUMINELP", "A1GS", "A2GS", "BETS", "BETA2SER", "GAMS", "MSPIKE", "SPEI" No results found for: "TIBC", "FERRITIN", "IRONPCTSAT" Lab Results  Component Value Date   LDH 129 03/22/2020     STUDIES:   No results found.

## 2023-03-27 ENCOUNTER — Inpatient Hospital Stay: Payer: Medicare Other

## 2023-03-27 ENCOUNTER — Inpatient Hospital Stay: Payer: Medicare Other | Admitting: Hematology

## 2023-03-27 ENCOUNTER — Other Ambulatory Visit: Payer: Self-pay

## 2023-03-27 VITALS — BP 136/85 | HR 66 | Temp 98.1°F | Resp 17 | Wt 145.8 lb

## 2023-03-27 VITALS — BP 135/78 | HR 60 | Temp 97.6°F | Resp 18

## 2023-03-27 DIAGNOSIS — C61 Malignant neoplasm of prostate: Secondary | ICD-10-CM

## 2023-03-27 DIAGNOSIS — Z5111 Encounter for antineoplastic chemotherapy: Secondary | ICD-10-CM | POA: Diagnosis not present

## 2023-03-27 DIAGNOSIS — Z95828 Presence of other vascular implants and grafts: Secondary | ICD-10-CM | POA: Diagnosis not present

## 2023-03-27 LAB — CBC WITH DIFFERENTIAL/PLATELET
Abs Immature Granulocytes: 0.02 10*3/uL (ref 0.00–0.07)
Basophils Absolute: 0.1 10*3/uL (ref 0.0–0.1)
Basophils Relative: 2 %
Eosinophils Absolute: 0 10*3/uL (ref 0.0–0.5)
Eosinophils Relative: 1 %
HCT: 42.7 % (ref 39.0–52.0)
Hemoglobin: 13.7 g/dL (ref 13.0–17.0)
Immature Granulocytes: 1 %
Lymphocytes Relative: 34 %
Lymphs Abs: 1.2 10*3/uL (ref 0.7–4.0)
MCH: 31.8 pg (ref 26.0–34.0)
MCHC: 32.1 g/dL (ref 30.0–36.0)
MCV: 99.1 fL (ref 80.0–100.0)
Monocytes Absolute: 0.5 10*3/uL (ref 0.1–1.0)
Monocytes Relative: 13 %
Neutro Abs: 1.7 10*3/uL (ref 1.7–7.7)
Neutrophils Relative %: 49 %
Platelets: 287 10*3/uL (ref 150–400)
RBC: 4.31 MIL/uL (ref 4.22–5.81)
RDW: 14.6 % (ref 11.5–15.5)
WBC: 3.5 10*3/uL — ABNORMAL LOW (ref 4.0–10.5)
nRBC: 0 % (ref 0.0–0.2)

## 2023-03-27 MED ORDER — SODIUM CHLORIDE 0.9 % IV SOLN: Freq: Once | INTRAVENOUS | Status: AC

## 2023-03-27 MED ORDER — SODIUM CHLORIDE 0.9 % IV SOLN
20.0000 mg/m2 | Freq: Once | INTRAVENOUS | Status: AC
  Administered 2023-03-27: 36 mg via INTRAVENOUS
  Filled 2023-03-27: qty 3.6

## 2023-03-27 MED ORDER — FAMOTIDINE IN NACL 20-0.9 MG/50ML-% IV SOLN
20.0000 mg | Freq: Once | INTRAVENOUS | Status: AC
  Administered 2023-03-27: 20 mg via INTRAVENOUS
  Filled 2023-03-27: qty 50

## 2023-03-27 MED ORDER — SODIUM CHLORIDE 0.9 % IV SOLN
150.0000 mg | Freq: Once | INTRAVENOUS | Status: AC
  Administered 2023-03-27: 150 mg via INTRAVENOUS
  Filled 2023-03-27: qty 150

## 2023-03-27 MED ORDER — CETIRIZINE HCL 10 MG/ML IV SOLN
10.0000 mg | Freq: Once | INTRAVENOUS | Status: AC
  Administered 2023-03-27: 10 mg via INTRAVENOUS
  Filled 2023-03-27: qty 1

## 2023-03-27 MED ORDER — HEPARIN SOD (PORK) LOCK FLUSH 100 UNIT/ML IV SOLN
500.0000 [IU] | Freq: Once | INTRAVENOUS | Status: AC | PRN
  Administered 2023-03-27: 500 [IU]

## 2023-03-27 MED ORDER — PALONOSETRON HCL INJECTION 0.25 MG/5ML
0.2500 mg | Freq: Once | INTRAVENOUS | Status: AC
  Administered 2023-03-27: 0.25 mg via INTRAVENOUS
  Filled 2023-03-27: qty 5

## 2023-03-27 MED ORDER — ONDANSETRON HCL 4 MG PO TABS: 4.0000 mg | ORAL_TABLET | Freq: Three times a day (TID) | ORAL | 2 refills | Status: DC | PRN

## 2023-03-27 MED ORDER — SODIUM CHLORIDE 0.9 % IV SOLN
10.0000 mg | Freq: Once | INTRAVENOUS | Status: AC
  Administered 2023-03-27: 10 mg via INTRAVENOUS
  Filled 2023-03-27: qty 10

## 2023-03-27 MED ORDER — SODIUM CHLORIDE 0.9% FLUSH
10.0000 mL | INTRAVENOUS | Status: DC | PRN
  Administered 2023-03-27: 10 mL

## 2023-03-27 NOTE — Patient Instructions (Signed)
[facility]  Discharge Instructions: Thank you for choosing  Cancer Center to provide your oncology and hematology care.  If you have a lab appointment with the Cancer Center - please note that after April 8th, 2024, all labs will be drawn in the cancer center.  You do not have to check in or register with the main entrance as you have in the past but will complete your check-in in the cancer center.  Wear comfortable clothing and clothing appropriate for easy access to any Portacath or PICC line.   We strive to give you quality time with your provider. You may need to reschedule your appointment if you arrive late (15 or more minutes).  Arriving late affects you and other patients whose appointments are after yours.  Also, if you miss three or more appointments without notifying the office, you may be dismissed from the clinic at the provider's discretion.      For prescription refill requests, have your pharmacy contact our office and allow 72 hours for refills to be completed.    Today you received the following chemotherapy and/or immunotherapy agents Jevtana.  Cabazitaxel Injection What is this medication? CABAZITAXEL (ka BAZ i TAX el) treats prostate cancer. It works by slowing down the growth of cancer cells. This medicine may be used for other purposes; ask your health care provider or pharmacist if you have questions. COMMON BRAND NAME(S): Jevtana What should I tell my care team before I take this medication? They need to know if you have any of these conditions: Kidney problems Liver disease Low white blood cell levels Lung disease Stomach or intestine problems An unusual or allergic reaction to cabazitaxel, polysorbate 80, other medications, foods, dyes, or preservatives Pregnant or trying to get pregnant Breast-feeding How should I use this medication? This medication is injected into a vein. It is given by your care team in a hospital or  clinic setting. Talk to your care team about the use of this medication in children. Special care may be needed. Overdosage: If you think you have taken too much of this medicine contact a poison control center or emergency room at once. NOTE: This medicine is only for you. Do not share this medicine with others. What if I miss a dose? Keep appointments for follow-up doses. It is important not to miss your dose. Call your care team if you are unable to keep an appointment. What may interact with this medication? Certain antibiotics, such as clarithromycin or telithromycin Certain antivirals for HIV or AIDS Certain medications for fungal infections like ketoconazole, itraconazole, and voriconazole Nefazodone This list may not describe all possible interactions. Give your health care provider a list of all the medicines, herbs, non-prescription drugs, or dietary supplements you use. Also tell them if you smoke, drink alcohol, or use illegal drugs. Some items may interact with your medicine. What should I watch for while using this medication? This medication may make you feel generally unwell. This is not uncommon as chemotherapy can affect healthy cells as well as cancer cells. Report any side effects. Continue your course of treatment even though you feel ill unless your care team tells you to stop. You may need blood work while you are taking this medication. This medication may increase your risk of getting an infection. Call your care team for advice if you get a fever, chills, sore throat, or other symptoms of a cold or flu. Do not treat yourself. Try to avoid being around  people who are sick. Avoid taking medications that contain aspirin, acetaminophen, ibuprofen, naproxen, or ketoprofen unless instructed by your care team. These medications may hide a fever. Be careful brushing or flossing your teeth or using a toothpick because you may get an infection or bleed more easily. If you have any  dental work done, tell your dentist you are receiving this medication. This medication can cause serious infusion reactions. To reduce the risk, your care team may give you other medications to take before receiving this one. Be sure to follow the directions from your care team. Use a condom during sex while taking this medication and for 4 months after the last dose. Talk to your care team right away if your partner may be pregnant. This medication can cause serious birth defects. This medication may cause infertility. Talk to your care team if you are concerned about your fertility. What side effects may I notice from receiving this medication? Side effects that you should report to your care team as soon as possible: Allergic reactions--skin rash, itching, hives, swelling of the face, lips, tongue, or throat Diarrhea, nausea, vomiting Dry cough, shortness of breath or trouble breathing Infection--fever, chills, cough, or sore throat Kidney injury--decrease in the amount of urine, swelling of the ankles, hands, or feet Pain, tingling, or numbness in the hands or feet Red or dark [place] urine Stomach bleeding--bloody or black, tar-like stools, vomiting blood or Christine Schiefelbein material that looks like coffee grounds Stomach pain that is severe, does not away, or gets worse Unusual bruising or bleeding Side effects that usually do not require medical attention (report these to your care team if they continue or are bothersome): Loss of appetite Unusual weakness or fatigue This list may not describe all possible side effects. Call your doctor for medical advice about side effects. You may report side effects to FDA at 1-800-FDA-1088. Where should I keep my medication? This medication is given in a hospital or clinic. It will not be stored at home. NOTE: This sheet is a summary. It may not cover all possible information. If you have questions about this medicine, talk to your doctor, pharmacist, or health  care provider.  2024 Elsevier/Gold Standard (2022-08-15 00:00:00)        To help prevent nausea and vomiting after your treatment, we encourage you to take your nausea medication as directed.  BELOW ARE SYMPTOMS THAT SHOULD BE REPORTED IMMEDIATELY: *FEVER GREATER THAN 100.4 F (38 C) OR HIGHER *CHILLS OR SWEATING *NAUSEA AND VOMITING THAT IS NOT CONTROLLED WITH YOUR NAUSEA MEDICATION *UNUSUAL SHORTNESS OF BREATH *UNUSUAL BRUISING OR BLEEDING *URINARY PROBLEMS (pain or burning when urinating, or frequent urination) *BOWEL PROBLEMS (unusual diarrhea, constipation, pain near the anus) TENDERNESS IN MOUTH AND THROAT WITH OR WITHOUT PRESENCE OF ULCERS (sore throat, sores in mouth, or a toothache) UNUSUAL RASH, SWELLING OR PAIN  UNUSUAL VAGINAL DISCHARGE OR ITCHING   Items with * indicate a potential emergency and should be followed up as soon as possible or go to the Emergency Department if any problems should occur.  Please show the CHEMOTHERAPY ALERT CARD or IMMUNOTHERAPY ALERT CARD at check-in to the Emergency Department and triage nurse.  Should you have questions after your visit or need to cancel or reschedule your appointment, please contact [facility] [phone]  and follow the prompts.  Office hours are 8:00 a.m. to 4:30 p.m. Monday - Friday. Please note that voicemails left after 4:00 p.m. may not be returned until the following business  day.  We are closed weekends and major holidays. You have access to a nurse at all times for urgent questions. Please call the main number to the clinic [phone] and follow the prompts.  For any non-urgent questions, you may also contact your provider using MyChart. We now offer e-Visits for anyone 50 and older to request care online for non-urgent symptoms. For details visit [email or web address].   Also download the MyChart app! Go to the app store, search "MyChart", open the app, select [facility], and log in with  your MyChart username and password.

## 2023-03-27 NOTE — Progress Notes (Signed)
Patient presents today for chemotherapy infusion. Patient is in satisfactory condition with no new complaints voiced.  Vital signs are stable.  Labs reviewed by Dr. Katragadda during the office visit and all labs are within treatment parameters.  We will proceed with treatment per MD orders.   Patient tolerated treatment well with no complaints voiced.  Patient left ambulatory in stable condition.  Vital signs stable at discharge.  Follow up as scheduled.       

## 2023-03-27 NOTE — Patient Instructions (Addendum)
[facility] Discharge Instructions   You were seen and examined today by Dr. Ellin Saba.  He reviewed the results of your lab work which are normal/stable. Your PSA has decreased to 5.66.   We will proceed with your treatment today.   Return as scheduled.    Thank you for choosing [facility] at [facility] to provide your oncology and hematology care.  To afford each patient quality time with our provider, please arrive at least 15 minutes before your scheduled appointment time.   If you have a lab appointment with the Cancer Center please come in thru the Main Entrance and check in at the main information desk.  You need to re-schedule your appointment should you arrive 10 or more minutes late.  We strive to give you quality time with our providers, and arriving late affects you and other patients whose appointments are after yours.  Also, if you no show three or more times for appointments you may be dismissed from the clinic at the providers discretion.     Again, thank you for choosing [facility].  Our hope is that these requests will decrease the amount of time that you wait before being seen by our physicians.       _____________________________________________________________  Should you have questions after your visit to [facility], please contact our office at [phone] and follow the prompts.  Our office hours are 8:00 a.m. and 4:30 p.m. Monday - Friday.  Please note that voicemails left after 4:00 p.m. may not be returned until the following business day.  We are closed weekends and major holidays.  You do have access to a nurse 24-7, just call the main number to the clinic [phone] and do not press any options, hold on the line and a nurse will answer the phone.    For prescription refill requests, have your pharmacy contact our office and allow 72 hours.    Due to Covid, you will  need to wear a mask upon entering the hospital. If you do not have a mask, a mask will be given to you at the Main Entrance upon arrival. For doctor visits, patients may have 1 support person age 23 or older with them. For treatment visits, patients can not have anyone with them due to social distancing guidelines and our immunocompromised population.

## 2023-03-27 NOTE — Progress Notes (Signed)
Ok to use CMET from 03/22/23  V.O. Dr Carilyn Goodpasture, PharmD

## 2023-03-27 NOTE — Progress Notes (Signed)
Patient has been examined by Dr. Katragadda. Vital signs and labs have been reviewed by MD - ANC, Creatinine, LFTs, hemoglobin, and platelets are within treatment parameters per M.D. - pt may proceed with treatment.  Primary RN and pharmacy notified.  

## 2023-03-29 ENCOUNTER — Ambulatory Visit: Payer: Medicare Other

## 2023-03-29 ENCOUNTER — Encounter: Payer: Self-pay | Admitting: Behavioral Health

## 2023-03-29 ENCOUNTER — Ambulatory Visit (INDEPENDENT_AMBULATORY_CARE_PROVIDER_SITE_OTHER): Payer: Medicare Other | Admitting: Behavioral Health

## 2023-03-29 DIAGNOSIS — F4010 Social phobia, unspecified: Secondary | ICD-10-CM | POA: Diagnosis not present

## 2023-03-29 DIAGNOSIS — F411 Generalized anxiety disorder: Secondary | ICD-10-CM

## 2023-03-29 NOTE — Progress Notes (Signed)
[facility] Counselor/Therapist Progress Note  Patient ID: Syeed Rathgeber, MRN: [id number],    Date: 03/29/2023  Time Spent: 30 minutes, from 3:28 PM to 3:58 PM.  This session was held via video teletherapy. The patient consented to the video teletherapy and was located in his home during this session. He is aware it is the responsibility of the patient to secure confidentiality on his end of the session. The provider was in and outpatient therapy office for the duration of this session.    Treatment Type: Individual Therapy  Reported Symptoms: anxiety  Mental Status Exam: Appearance:  Well Groomed     Behavior: Appropriate  Motor: Normal  Speech/Language:  Clear and Coherent  Affect: Appropriate  Mood: anxious  Thought process: normal  Thought content:   WNL  Sensory/Perceptual disturbances:   WNL  Orientation: oriented to person, place, time/date, situation, day of week, and month of year  Attention: Good  Concentration: Good  Memory: WNL  Fund of knowledge:  Good  Insight:   Good  Judgment:  Good  Impulse Control: Good   Risk Assessment: Danger to Self:  No Self-injurious Behavior: No Danger to Others: No Duty to Warn:no Physical Aggression / Violence:No  Access to Firearms a concern: No  Gang Involvement:No   Subjective: Today's session was scheduled for 3:00 but there was difficulty connecting via video and then he had phone issues.  We finally connected about 328 spent 30 minutes in session.  He received a chemo treatment 2 days ago and said he is always weak for a few days after but today and yesterday he also has been somewhat dizzy.  He says it was not debilitating but he had to be a little more intentional when he was getting up and down.  If it continues he will reach out to his doctor because that is a fairly new symptom.  He did get some good news from his blood work and that the cancer is not progressing currently.  He says that he continues to  struggle with the emotional and mental aspect of going through this for so long.  He is not hopeless because he continues to get treatments but he knows there are many other options.  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  He still is able to go to practice on Wednesday afternoons and church on Sunday but that takes a lot of his energy.  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  He is limited in what he can do in terms of energy and getting out as we talked about things he could do at home.  He does not feel like moving to a different area at this point would be an option or trying to find another job based on going through chemotherapy.  I encouraged use of coping skills for anxiety and depression and including using cognitive behavioral therapy to challenge some of the negative thoughts.  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  We reviewed coping skills for anxiety grief depression and irritability.  We practiced mindfulness and compartmentalization as ways to alleviate those.  We did complete a PHQ-9 with a score of 7.  He does contract for safety having no thoughts of hurting  himself or anyone else.  Interventions: Cognitive Behavioral Therapy  Diagnosis: Generalized anxiety disorder, social anxiety disorder  Plan: I will meet with the patient every 3 to 4 weeks preferably in office. Treatment plan: We will use cognitive behavioral therapy as well as elements of dialectical behavior therapy and person centered therapy to help reduce the patient's anxiety by at least 50% by July 07, 2023 as well as support him as he goes to intensive chemotherapy for cancer.  Goals will be to improve his ability to manage anxiety symptoms better, resolve issues  contributing to his anxiety including his past and treatment that he is going through as well as helping him manage thoughts and worrisome thinking contributing to feelings of anxiety.  We will facilitate problem solution skills to help him manage anxiety and stress, teach coping skills for managing anxiety and stress as well as using cognitive behavioral therapy to identify and change anxiety provoking thoughts and behavior patterns.  We will also teach DBT distress tolerance and mindfulness skills to help him learn better anxiety management.  Progress: 25% [name], [facility]                 [name], [facility]               [name], [facility]               [name], [facility]

## 2023-04-12 ENCOUNTER — Ambulatory Visit (INDEPENDENT_AMBULATORY_CARE_PROVIDER_SITE_OTHER): Payer: Medicare Other | Admitting: Behavioral Health

## 2023-04-12 ENCOUNTER — Encounter: Payer: Self-pay | Admitting: Behavioral Health

## 2023-04-12 DIAGNOSIS — F331 Major depressive disorder, recurrent, moderate: Secondary | ICD-10-CM

## 2023-04-12 DIAGNOSIS — F411 Generalized anxiety disorder: Secondary | ICD-10-CM | POA: Diagnosis not present

## 2023-04-12 NOTE — Progress Notes (Signed)
[facility] Counselor/Therapist Progress Note  Patient ID: Nathan Smith, MRN: [id number],    Date: 04/12/2023  Time Spent: 56 minutes, from 3:00 PM to 3:56 PM.  This session was held via video teletherapy. The patient consented to the video teletherapy and was located in his home during this session. He is aware it is the responsibility of the patient to secure confidentiality on his end of the session. The provider was in and outpatient therapy office for the duration of this session.   The appointment was scheduled for 3:00 and we did start the appointment promptly at 3:00.  The patient has poor Wi-Fi in the role area that he lives and so we attempted to make the video session work for the first 27 minutes of the session but there was significant difficulty so we switched to strictly phone for the balance of the session. Treatment Type: Individual Therapy  Reported Symptoms: anxiety  Mental Status Exam: Appearance:  Well Groomed     Behavior: Appropriate  Motor: Normal  Speech/Language:  Clear and Coherent  Affect: Appropriate  Mood: anxious  Thought process: normal  Thought content:   WNL  Sensory/Perceptual disturbances:   WNL  Orientation: oriented to person, place, time/date, situation, day of week, and month of year  Attention: Good  Concentration: Good  Memory: WNL  Fund of knowledge:  Good  Insight:   Good  Judgment:  Good  Impulse Control: Good   Risk Assessment: Danger to Self:  No Self-injurious Behavior: No Danger to Others: No Duty to Warn:no Physical Aggression / Violence:No  Access to Firearms a concern: No  Gang Involvement:No   Subjective: The patient has not felt well the last couple of days physically saying he is just exhausted.  He mentioned that our last session that dizziness had been a new symptom of his treatment and that has not changed much over the past couple of weeks.  He says it is not debilitating but is something he is aware  of.  When he is getting in and out of the shower he is more careful.  He does not drive even to a local grocery store if he feels that the dizziness is flaring up.  He continues to not have a lot of energy and knows his limit is usually a couple of hours if he is up and out.  He is weighing options and will speak to his oncologist about this to see if there is any other options for treatment or something might be added for the dizziness.  He also keeps his eyes open for any clinical trials at [facility] where he has done clinical trials before.  He also has begun looking at it more from the spiritual perspective.  We talked about his past and the choices that he made and wonders if this might be God giving him a chance to strengthen his relationship with God.  We talked about guilt versus shame and where forgiveness plays into the choices that he feels he has made in life versus where he is now and the growth that he has made.  We talked about looking at this as a learning opportunity and how he can see growth in his faith through cancer and the treatment. We reviewed coping skills for anxiety grief depression and irritability.  We practiced mindfulness and compartmentalization as ways to alleviate those  He does contract for safety having no thoughts of hurting himself or anyone else.  Interventions: Cognitive Behavioral Therapy  Diagnosis: Generalized anxiety disorder, social anxiety disorder  Plan: I will meet with the patient every 3 to 4 weeks preferably in office. Treatment plan: We will use cognitive behavioral therapy as well as elements of dialectical behavior therapy and person centered therapy to help reduce the patient's anxiety by at least 50% by July 07, 2023 as well as support him as he goes to intensive chemotherapy for cancer.  Goals will be to improve his ability to manage anxiety symptoms better, resolve issues contributing to his anxiety including his past and treatment that  he is going through as well as helping him manage thoughts and worrisome thinking contributing to feelings of anxiety.  We will facilitate problem solution skills to help him manage anxiety and stress, teach coping skills for managing anxiety and stress as well as using cognitive behavioral therapy to identify and change anxiety provoking thoughts and behavior patterns.  We will also teach DBT distress tolerance and mindfulness skills to help him learn better anxiety management.  Progress: 25% [name], [facility]                 [name], [facility]               [name], [facility]               [name], [facility]               [name], [facility]

## 2023-04-16 ENCOUNTER — Inpatient Hospital Stay: Payer: Medicare Other | Attending: Hematology

## 2023-04-16 DIAGNOSIS — Z79899 Other long term (current) drug therapy: Secondary | ICD-10-CM | POA: Diagnosis not present

## 2023-04-16 DIAGNOSIS — Z1509 Genetic susceptibility to other malignant neoplasm: Secondary | ICD-10-CM | POA: Insufficient documentation

## 2023-04-16 DIAGNOSIS — R45851 Suicidal ideations: Secondary | ICD-10-CM | POA: Diagnosis not present

## 2023-04-16 DIAGNOSIS — R530 Neoplastic (malignant) related fatigue: Secondary | ICD-10-CM | POA: Diagnosis not present

## 2023-04-16 DIAGNOSIS — Z5111 Encounter for antineoplastic chemotherapy: Secondary | ICD-10-CM | POA: Diagnosis present

## 2023-04-16 DIAGNOSIS — C7951 Secondary malignant neoplasm of bone: Secondary | ICD-10-CM | POA: Insufficient documentation

## 2023-04-16 DIAGNOSIS — C772 Secondary and unspecified malignant neoplasm of intra-abdominal lymph nodes: Secondary | ICD-10-CM | POA: Diagnosis not present

## 2023-04-16 DIAGNOSIS — R42 Dizziness and giddiness: Secondary | ICD-10-CM | POA: Insufficient documentation

## 2023-04-16 DIAGNOSIS — Z95828 Presence of other vascular implants and grafts: Secondary | ICD-10-CM

## 2023-04-16 DIAGNOSIS — R110 Nausea: Secondary | ICD-10-CM | POA: Diagnosis not present

## 2023-04-16 DIAGNOSIS — C61 Malignant neoplasm of prostate: Secondary | ICD-10-CM | POA: Diagnosis present

## 2023-04-16 DIAGNOSIS — K5900 Constipation, unspecified: Secondary | ICD-10-CM | POA: Diagnosis not present

## 2023-04-16 DIAGNOSIS — R519 Headache, unspecified: Secondary | ICD-10-CM | POA: Insufficient documentation

## 2023-04-16 DIAGNOSIS — I10 Essential (primary) hypertension: Secondary | ICD-10-CM | POA: Diagnosis not present

## 2023-04-16 LAB — CBC WITH DIFFERENTIAL/PLATELET
Abs Immature Granulocytes: 0.03 10*3/uL (ref 0.00–0.07)
Basophils Absolute: 0 10*3/uL (ref 0.0–0.1)
Basophils Relative: 1 %
Eosinophils Absolute: 0 10*3/uL (ref 0.0–0.5)
Eosinophils Relative: 0 %
HCT: 42.5 % (ref 39.0–52.0)
Hemoglobin: 13.6 g/dL (ref 13.0–17.0)
Immature Granulocytes: 1 %
Lymphocytes Relative: 24 %
Lymphs Abs: 1 10*3/uL (ref 0.7–4.0)
MCH: 31.9 pg (ref 26.0–34.0)
MCHC: 32 g/dL (ref 30.0–36.0)
MCV: 99.5 fL (ref 80.0–100.0)
Monocytes Absolute: 0.8 10*3/uL (ref 0.1–1.0)
Monocytes Relative: 21 %
Neutro Abs: 2.2 10*3/uL (ref 1.7–7.7)
Neutrophils Relative %: 53 %
Platelets: 306 10*3/uL (ref 150–400)
RBC: 4.27 MIL/uL (ref 4.22–5.81)
RDW: 14.7 % (ref 11.5–15.5)
WBC: 4.1 10*3/uL (ref 4.0–10.5)
nRBC: 0 % (ref 0.0–0.2)

## 2023-04-16 LAB — COMPREHENSIVE METABOLIC PANEL
ALT: 15 U/L (ref 0–44)
AST: 18 U/L (ref 15–41)
Albumin: 3.9 g/dL (ref 3.5–5.0)
Alkaline Phosphatase: 50 U/L (ref 38–126)
Anion gap: 6 (ref 5–15)
BUN: 14 mg/dL (ref 8–23)
CO2: 28 mmol/L (ref 22–32)
Calcium: 8.8 mg/dL — ABNORMAL LOW (ref 8.9–10.3)
Chloride: 102 mmol/L (ref 98–111)
Creatinine, Ser: 0.99 mg/dL (ref 0.61–1.24)
GFR, Estimated: >60 mL/min (ref >60)
Glucose, Bld: 102 mg/dL — ABNORMAL HIGH (ref 70–99)
Potassium: 4.1 mmol/L (ref 3.5–5.1)
Sodium: 136 mmol/L (ref 135–145)
Total Bilirubin: 0.4 mg/dL (ref 0.3–1.2)
Total Protein: 6.4 g/dL — ABNORMAL LOW (ref 6.5–8.1)

## 2023-04-16 LAB — PSA: Prostatic Specific Antigen: 5.27 ng/mL — ABNORMAL HIGH (ref 0.00–4.00)

## 2023-04-16 LAB — MAGNESIUM: Magnesium: 2.2 mg/dL (ref 1.7–2.4)

## 2023-04-17 ENCOUNTER — Inpatient Hospital Stay: Payer: Medicare Other

## 2023-04-17 ENCOUNTER — Inpatient Hospital Stay (HOSPITAL_BASED_OUTPATIENT_CLINIC_OR_DEPARTMENT_OTHER): Payer: Medicare Other | Admitting: Hematology

## 2023-04-17 VITALS — BP 153/91 | HR 60 | Temp 96.2°F

## 2023-04-17 DIAGNOSIS — C61 Malignant neoplasm of prostate: Secondary | ICD-10-CM

## 2023-04-17 DIAGNOSIS — Z95828 Presence of other vascular implants and grafts: Secondary | ICD-10-CM

## 2023-04-17 DIAGNOSIS — Z5111 Encounter for antineoplastic chemotherapy: Secondary | ICD-10-CM | POA: Diagnosis not present

## 2023-04-17 MED ORDER — SODIUM CHLORIDE 0.9% FLUSH
10.0000 mL | INTRAVENOUS | Status: DC | PRN
  Administered 2023-04-17: 10 mL

## 2023-04-17 MED ORDER — FAMOTIDINE IN NACL 20-0.9 MG/50ML-% IV SOLN
20.0000 mg | Freq: Once | INTRAVENOUS | Status: AC
  Administered 2023-04-17: 20 mg via INTRAVENOUS
  Filled 2023-04-17: qty 50

## 2023-04-17 MED ORDER — HEPARIN SOD (PORK) LOCK FLUSH 100 UNIT/ML IV SOLN
500.0000 [IU] | Freq: Once | INTRAVENOUS | Status: AC | PRN
  Administered 2023-04-17: 500 [IU]

## 2023-04-17 MED ORDER — SODIUM CHLORIDE 0.9 % IV SOLN
20.0000 mg/m2 | Freq: Once | INTRAVENOUS | Status: AC
  Administered 2023-04-17: 36 mg via INTRAVENOUS
  Filled 2023-04-17: qty 3.6

## 2023-04-17 MED ORDER — SODIUM CHLORIDE 0.9 % IV SOLN: Freq: Once | INTRAVENOUS | Status: AC

## 2023-04-17 MED ORDER — SODIUM CHLORIDE 0.9 % IV SOLN
150.0000 mg | Freq: Once | INTRAVENOUS | Status: AC
  Administered 2023-04-17: 150 mg via INTRAVENOUS
  Filled 2023-04-17: qty 150

## 2023-04-17 MED ORDER — CETIRIZINE HCL 10 MG/ML IV SOLN
10.0000 mg | Freq: Once | INTRAVENOUS | Status: AC
  Administered 2023-04-17: 10 mg via INTRAVENOUS
  Filled 2023-04-17: qty 1

## 2023-04-17 MED ORDER — PALONOSETRON HCL INJECTION 0.25 MG/5ML
0.2500 mg | Freq: Once | INTRAVENOUS | Status: AC
  Administered 2023-04-17: 0.25 mg via INTRAVENOUS
  Filled 2023-04-17: qty 5

## 2023-04-17 MED ORDER — SODIUM CHLORIDE 0.9 % IV SOLN
10.0000 mg | Freq: Once | INTRAVENOUS | Status: AC
  Administered 2023-04-17: 10 mg via INTRAVENOUS
  Filled 2023-04-17: qty 10

## 2023-04-17 NOTE — Progress Notes (Signed)
[facility] [address], [place] [address]    Clinic Day:  04/17/23    Referring physician: Doreatha Massed, MD  Patient Care Team: Patient, No Pcp Per as PCP - General (General Practice) Doreatha Massed, MD as Consulting Physician (Oncology) Mickie Bail, RN as Oncology Nurse Navigator (Oncology)   ASSESSMENT & PLAN:   Assessment: 1.  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 -External beam radiation with 2 years of Lupron from 05/27/2013 through 07/28/2013.  Received Lupron until 09/28/2014. -Rising PSA levels in December 2018, of 2.8.  Local recurrence was confirmed with biopsy. -Bone scan on 04/12/2018 did not show any evidence of metastatic disease. -He was evaluated by Dr. Greggory Stallion at [facility].  Bone scan and CT scan at [facility] on 07/02/2018 did not show any evidence of metastatic disease.  Left para-aortic lymph node measures 8 mm.  Lupron started for nonmetastatic HSPC in November 2019 for rapid PSA doubling time of less than 3 months.  He had difficulty tolerating Lupron with irritability, low energy and fatigue. -PSA 5.7 (07/08/2019), PSA 11.1 (11/26/2019) with testosterone 18. -Fluciclovine PET CT scan on 12/23/2019 showed T11 meta stasis.  Asymmetric hypermetabolism within the prostate suspicious for residual disease.  Mild enlargement of 2 adjacent lower left para-aortic nodes, largest 8 mm with SUV of 3.4.  These nodes measured maximally 5 mm on PET scan on 02/25/2018. -Abiraterone 500 mg daily and prednisone from 01/03/2020 through 02/16/2020, discontinued secondary to elevated LFTs. -We will consider checking germline and somatic mutation testing. -Lupron 45 mg on 02/17/2020 at Dr. Lenoria Chime office. -Enzalutamide from 03/10/2020 through 10/17/2022 with progression. - Guardant360: MSI high not detected. NOTCH1 mutation. - Recommended cabazitaxel to minimize  incidence of neuropathy as he is a Technical sales engineer and plays guitar. - Cycle 1 cabazitaxel on 11/21/2022   2.  Heterozygosity for MUTYH mutation: - Cologuard test was refused previously.    Plan: 1.  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 - He reported feeling awful during the first week.  He felt better during the second week.  More fatigue was reported in the third week.  He had episodes of dizziness when he was standing for a long time in the church playing music.  He also had some headaches in the back of the neck. - Overall he does not like the way chemo makes him feel.  He would not like to give up at this time. - I have discussed other option of trying Pluvicto at this time.  We discussed side effects of Pluvicto in detail.  I will make a referral to Dr. Amil Amen. - I have reviewed his labs from 04/16/2023.  PSA improved to 5.27. - He will proceed with next cycle of cabazitaxel today.  We will also change the frequency to every 4 weeks to see if it helps with tolerability. - RTC 4 weeks for follow-up. - We will also make him talk to our social worker as he has questions about applying for disability.   2.  Bone metastasis: - Denosumab was refused previously.  Continue calcium and vitamin D supplements.   3.  Hypertension: - Continue losartan 25 mg daily.  Blood pressure today is 140/86.   4.  Cancer-related fatigue: - Continue 1/8 of Ritalin tablet as needed on Sundays.  5.  Nausea: -  Continue Zofran 4 mg every 8 hours as needed.    Orders Placed This Encounter  Procedures   NM Radiologist Eval And Mgmt    Standing Status:   Future    Standing Expiration Date:   04/16/2024    Order Specific Question:   Reason for Exam (SYMPTOM  OR DIAGNOSIS REQUIRED)    Answer:   consultation for pluvicto therapy    Order Specific Question:   If indicated for the ordered procedure, I authorize the administration of a  radiopharmaceutical per Radiology protocol    Answer:   Yes    Order Specific Question:   Preferred imaging location?    Answer:   [facility]   NM PLUVICTO ADMINISTRATION    Standing Status:   Future    Standing Expiration Date:   04/16/2024    Order Specific Question:   Reason for Exam (SYMPTOM  OR DIAGNOSIS REQUIRED)    Answer:   eval for Pluvicto administration    Order Specific Question:   If indicated for the ordered procedure, I authorize the administration of a radiopharmaceutical per Radiology protocol    Answer:   Yes    Order Specific Question:   Preferred imaging location?    Answer:   [facility]      I,Helena R Teague,acting as a scribe for Doreatha Massed, MD.,have documented all relevant documentation on the behalf of Doreatha Massed, MD,as directed by  Doreatha Massed, MD while in the presence of Doreatha Massed, MD.  I, Doreatha Massed MD, have reviewed the above documentation for accuracy and completeness, and I agree with the above.       Doreatha Massed, MD   9/10/20244:21 PM  CHIEF COMPLAINT:   Diagnosis: castration resistant prostate cancer    Cancer Staging  Prostate cancer [facility]) Staging form: Prostate, AJCC 7th Edition - Clinical stage from 12/26/2019: Stage IV (yTX, N1, M1b, PSA: 10 to 19, Gleason 8-10) - Signed by Doreatha Massed, MD on 12/26/2019    Prior Therapy: 1. XRT 05/27/2013 - 07/28/2013  2. Lupron 03/2013 - 09/28/14 3. Abiraterone 12/24/19 - 02/16/20 4. enzalutamide 80 mg QD 03/10/20 - 10/17/22  Current Therapy:  cabazitaxel; Lupron every 6 months    HISTORY OF PRESENT ILLNESS:   Oncology History  Prostate cancer (HCC)  09/24/2012 Initial Diagnosis   Prostate cancer (HCC)   12/26/2019 Cancer Staging   Staging form: Prostate, AJCC 7th Edition - Clinical stage from 12/26/2019: Stage IV (yTX, N1, M1b, PSA: 10 to 19, Gleason 8-10) - Signed by Doreatha Massed, MD on 12/26/2019   08/24/2020  Genetic Testing   Positive genetic testing:  A single, 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 Testing was completed through the Common Hereditary Cancers panel and Prostate Cancer HRR panel offered by [facility] laboratories. The report date is 08/24/2020.   The Common Hereditary Cancers Panel offered by [facility] includes sequencing and/or deletion duplication testing of the following 47 genes: APC, ATM, AXIN2, BARD1, BMPR1A, BRCA1, BRCA2, BRIP1, CDH1, CDK4, CDKN2A (p14ARF), CDKN2A (p16INK4a), CHEK2, CTNNA1, DICER1, EPCAM (Deletion/duplication testing only), GREM1 (promoter region deletion/duplication testing only), KIT, MEN1, MLH1, MSH2, MSH3, MSH6, MUTYH, NBN, NF1, NTHL1, PALB2, PDGFRA, PMS2, POLD1, POLE, PTEN, RAD50, RAD51C, RAD51D, SDHB, SDHC, SDHD, SMAD4, SMARCA4. STK11, TP53, TSC1, TSC2, and VHL.  The following genes were evaluated for sequence changes only: SDHA and HOXB13 c.251G>A variant only. The Prostate Cancer HRR Panel offered by [facility] includes sequencing and/or deletion/duplication analysis of the following 10 genes:  ATM, BARD1, BRCA1, BRCA2, BRIP1, CHEK2, FANCL, PALB2, RAD51C, and RAD51D.   11/21/2022 -  Chemotherapy   Patient is on Treatment Plan : PROSTATE Cabazitaxel (20) D1 + Prednisone D1-21 q21d        INTERVAL HISTORY:   Nathan Smith is a 72 y.o. male presenting to clinic today for follow up of castration resistant prostate cancer. He was last seen by me on 03/27/23.  Today, he states that he is doing well overall. His appetite level is at 90%. His energy level is at 25%.    He has a healthy appetite and diet. He reports extreme fatigue after last treatment. He also reports dizziness that was constant for a week after treatment and then intermittent since. Fatigue and dizziness is worsened when standing for a period of time and walking. He also reports stabbing headaches in the back of his head and neck that radiate to the bilateral shoulders. The  headaches could be attributed to tension. All three symptoms were worsened the week following treatment, in which they mildly improve for a week, and then worsen for an additional week after. When symptoms are severe, he feels hopeless and has suicidal ideations, though he reports he would not attempt.   He c/o excessive weakness that have worsened the past week.   He takes Losartan OD.   PAST MEDICAL HISTORY:   Past Medical History: Past Medical History:  Diagnosis Date   Anxiety    Bilateral hydronephrosis    Bladder incontinence    night time,  past 2 months   BPH (benign prostatic hypertrophy) with urinary obstruction    Depression    ED (erectile dysfunction)    H/O ascites    trace   Hypertension    Port-A-Cath in place 11/13/2022   Prostate cancer (HCC) 09/24/2012   gleason 4+3=7., & 4+4=8,PSA=67.30, volume=33.7cc    Surgical History: Past Surgical History:  Procedure Laterality Date   IR IMAGING GUIDED PORT INSERTION  11/16/2022   PROSTATE BIOPSY  09/24/12   Adenocarcinoma   PROSTATE BIOPSY N/A 12/20/2017   Procedure: BIOPSY TRANSRECTAL ULTRASONIC PROSTATE (TUBP);  Surgeon: Marcine Matar, MD;  Location: [facility];  Service: Urology;  Laterality: N/A;   TONSILLECTOMY      Social History: Social History   Socioeconomic History   Marital status: Single    Spouse name: Not on file   Number of children: 0   Years of education: Not on file   Highest education level: Not on file  Occupational History   Occupation: [name]    Employer: [employer]  Tobacco Use   Smoking status: Never   Smokeless tobacco: Never  Vaping Use   Vaping status: Never Used  Substance and Sexual Activity   Alcohol use: Yes    Alcohol/week: 2.0 standard drinks of alcohol    Types: 2 Cans of beer per week    Comment: 3 per day, beer/wine , hx etoh abuse years ago   Drug use: No    Comment: hx   Sexual activity: Yes    Birth control/protection: Condom  Other  Topics Concern   Not on file  Social History Narrative   Not on file   Social Determinants of Health   Financial Resource Strain: Low Risk  (12/08/2019)   Overall Financial Resource Strain (CARDIA)    Difficulty of Paying Living Expenses: Not very hard  Food Insecurity: No Food Insecurity (12/08/2019)   Hunger Vital Sign    Worried About Running Out of Food  in the Last Year: Never true    Ran Out of Food in the Last Year: Never true  Transportation Needs: No Transportation Needs (12/08/2019)   PRAPARE - Administrator, Civil Service (Medical): No    Lack of Transportation (Non-Medical): No  Physical Activity: Sufficiently Active (12/08/2019)   Exercise Vital Sign    Days of Exercise per Week: 6 days    Minutes of Exercise per Session: 60 min  Stress: Stress Concern Present (12/08/2019)   [employer] of Occupational Health - Occupational Stress Questionnaire    Feeling of Stress : To some extent  Social Connections: Unknown (12/08/2019)   Social Connection and Isolation Panel [NHANES]    Frequency of Communication with Friends and Family: Twice a week    Frequency of Social Gatherings with Friends and Family: Once a week    Attends Religious Services: More than 4 times per year    Active Member of [employer] or Organizations: No    Attends Engineer, structural: More than 4 times per year    Marital Status: Not on file  Intimate Partner Violence: Not At Risk (12/08/2019)   Humiliation, Afraid, Rape, and Kick questionnaire    Fear of Current or Ex-Partner: No    Emotionally Abused: No    Physically Abused: No    Sexually Abused: No    Family History: Family History  Problem Relation Age of Onset   Depression Mother    Dementia Mother    Anxiety disorder Mother    Prostate cancer Father        unconfirmed diagnosis    Current Medications:  Current Outpatient Medications:    Ascorbic Acid (VITAMIN C) 1000 MG tablet, Take 1,000 mg by mouth daily., Disp: , Rfl:     b complex vitamins capsule, Take 1 capsule by mouth daily., Disp: , Rfl:    Bacillus Coagulans-Inulin (PROBIOTIC) 1-250 BILLION-MG CAPS, Take by mouth daily. , Disp: , Rfl:    Cabazitaxel (JEVTANA IV), Inject into the vein every 21 ( twenty-one) days., Disp: , Rfl:    Cholecalciferol 125 MCG (5000 UT) capsule, Take 5,000 Units by mouth daily. , Disp: , Rfl:    Ginseng 100 MG CAPS, Take by mouth daily. , Disp: , Rfl:    L-ARGININE PO, Take 1,000 mg by mouth daily. , Disp: , Rfl:    lidocaine-prilocaine (EMLA) cream, Apply a small amount to port a cath site and cover with plastic wrap one hour prior to infusion appointments, Disp: 30 g, Rfl: 3   losartan (COZAAR) 25 MG tablet, TAKE 1 TABLET(25 MG) BY MOUTH DAILY, Disp: 90 tablet, Rfl: 3   melatonin 1 MG TABS tablet, Take 2 mg by mouth at bedtime. , Disp: , Rfl:    methylphenidate (RITALIN) 5 MG tablet, Take 0.5 tablets (2.5 mg total) by mouth daily as needed., Disp: 10 tablet, Rfl: 0   ondansetron (ZOFRAN) 4 MG tablet, Take 1 tablet (4 mg total) by mouth every 8 (eight) hours as needed for nausea or vomiting., Disp: 60 tablet, Rfl: 2   Probiotic Product (ALIGN) 4 MG CAPS, Take 1 capsule by mouth daily. , Disp: , Rfl:    prochlorperazine (COMPAZINE) 10 MG tablet, Take 1 tablet (10 mg total) by mouth every 6 (six) hours as needed for nausea or vomiting., Disp: 60 tablet, Rfl: 1   tamsulosin (FLOMAX) 0.4 MG CAPS capsule, TAKE 1 CAPSULE(0.4 MG) BY MOUTH DAILY, Disp: 90 capsule, Rfl: 3   zinc gluconate 50  MG tablet, Take 50 mg by mouth daily., Disp: , Rfl:  No current facility-administered medications for this visit.  Facility-Administered Medications Ordered in Other Visits:    cabazitaxel (JEVTANA) 36 mg in sodium chloride 0.9 % 250 mL chemo infusion, 20 mg/m2 (Treatment Plan Recorded), Intravenous, Once, Doreatha Massed, MD, Last Rate: 254 mL/hr at 04/17/23 1527, 36 mg at 04/17/23 1527   fulvestrant (FASLODEX) 250 MG/5ML injection, , , ,     heparin lock flush 100 unit/mL, 500 Units, Intracatheter, Once PRN, Doreatha Massed, MD   sodium chloride flush (NS) 0.9 % injection 10 mL, 10 mL, Intracatheter, PRN, Doreatha Massed, MD   Allergies: No Known Allergies  REVIEW OF SYSTEMS:   Review of Systems  Constitutional:  Negative for chills, fatigue and fever.  HENT:   Positive for trouble swallowing (after treatment). Negative for lump/mass, mouth sores, nosebleeds and sore throat.   Eyes:  Negative for eye problems.  Respiratory:  Negative for cough and shortness of breath.   Cardiovascular:  Negative for chest pain, leg swelling and palpitations.  Gastrointestinal:  Positive for constipation and nausea. Negative for abdominal pain, diarrhea and vomiting.  Genitourinary:  Negative for bladder incontinence, difficulty urinating, dysuria, frequency, hematuria and nocturia.   Musculoskeletal:  Negative for arthralgias, back pain, flank pain, myalgias and neck pain.  Skin:  Negative for itching and rash.  Neurological:  Positive for dizziness, headaches and numbness (tingling in hands).  Hematological:  Does not bruise/bleed easily.  Psychiatric/Behavioral:  Positive for sleep disturbance. Negative for depression and suicidal ideas. The patient is nervous/anxious.   All other systems reviewed and are negative.    VITALS:   Blood pressure (!) 140/86, pulse 67, temperature 98.4 F (36.9 C), temperature source Oral, resp. rate 18, weight 145 lb 14.4 oz (66.2 kg), SpO2 99%.  Wt Readings from Last 3 Encounters:  04/17/23 145 lb 14.4 oz (66.2 kg)  03/27/23 145 lb 12.8 oz (66.1 kg)  03/06/23 145 lb 6.4 oz (66 kg)    Body mass index is 19.79 kg/m.  Performance status (ECOG): 1 - Symptomatic but completely ambulatory  PHYSICAL EXAM:   Physical Exam Vitals and nursing note reviewed. Exam conducted with a chaperone present.  Constitutional:      Appearance: Normal appearance.  Cardiovascular:     Rate and Rhythm: Normal  rate and regular rhythm.     Pulses: Normal pulses.     Heart sounds: Normal heart sounds.  Pulmonary:     Effort: Pulmonary effort is normal.     Breath sounds: Normal breath sounds.  Abdominal:     Palpations: Abdomen is soft. There is no hepatomegaly, splenomegaly or mass.     Tenderness: There is no abdominal tenderness.  Musculoskeletal:     Right lower leg: No edema.     Left lower leg: No edema.  Lymphadenopathy:     Cervical: No cervical adenopathy.     Right cervical: No superficial, deep or posterior cervical adenopathy.    Left cervical: No superficial, deep or posterior cervical adenopathy.     Upper Body:     Right upper body: No supraclavicular or axillary adenopathy.     Left upper body: No supraclavicular or axillary adenopathy.  Neurological:     General: No focal deficit present.     Mental Status: He is alert and oriented to person, place, and time.  Psychiatric:        Mood and Affect: Mood normal.  Behavior: Behavior normal.     LABS:      Latest Ref Rng & Units 04/16/2023    1:41 PM 03/27/2023   10:35 AM 03/22/2023    1:48 PM  CBC  WBC 4.0 - 10.5 K/uL 4.1  3.5  2.1   Hemoglobin 13.0 - 17.0 g/dL 16.1  09.6  04.5   Hematocrit 39.0 - 52.0 % 42.5  42.7  40.0   Platelets 150 - 400 K/uL 306  287  250       Latest Ref Rng & Units 04/16/2023    1:41 PM 03/22/2023    1:48 PM 03/05/2023    2:11 PM  CMP  Glucose 70 - 99 mg/dL 409  811  914   BUN 8 - 23 mg/dL 14  13  12    Creatinine 0.61 - 1.24 mg/dL 7.82  9.56  2.13   Sodium 135 - 145 mmol/L 136  136  136   Potassium 3.5 - 5.1 mmol/L 4.1  4.2  4.3   Chloride 98 - 111 mmol/L 102  101  102   CO2 22 - 32 mmol/L 28  27  28    Calcium 8.9 - 10.3 mg/dL 8.8  8.8  9.0   Total Protein 6.5 - 8.1 g/dL 6.4  6.3  6.9   Total Bilirubin 0.3 - 1.2 mg/dL 0.4  0.4  0.8   Alkaline Phos 38 - 126 U/L 50  44  53   AST 15 - 41 U/L 18  17  20    ALT 0 - 44 U/L 15  15  15       No results found for: "CEA1", "CEA" / No  results found for: "CEA1", "CEA" Lab Results  Component Value Date   PSA1 0.3 08/30/2021   No results found for: "YQM578" No results found for: "CAN125"  No results found for: "TOTALPROTELP", "ALBUMINELP", "A1GS", "A2GS", "BETS", "BETA2SER", "GAMS", "MSPIKE", "SPEI" No results found for: "TIBC", "FERRITIN", "IRONPCTSAT" Lab Results  Component Value Date   LDH 129 03/22/2020     STUDIES:   No results found.

## 2023-04-17 NOTE — Progress Notes (Signed)
Labs reviewed at office visit today. Ok to treat per MD.

## 2023-04-17 NOTE — Progress Notes (Signed)
Patient has been examined by Dr. Katragadda. Vital signs and labs have been reviewed by MD - ANC, Creatinine, LFTs, hemoglobin, and platelets are within treatment parameters per M.D. - pt may proceed with treatment.  Primary RN and pharmacy notified.  

## 2023-04-17 NOTE — Patient Instructions (Signed)

## 2023-04-17 NOTE — Patient Instructions (Signed)
[facility]  Discharge Instructions: Thank you for choosing [facility] to provide your oncology and hematology care.  If you have a lab appointment with the Cancer Center - please note that after April 8th, 2024, all labs will be drawn in the cancer center.  You do not have to check in or register with the main entrance as you have in the past but will complete your check-in in the cancer center.  Wear comfortable clothing and clothing appropriate for easy access to any Portacath or PICC line.   We strive to give you quality time with your provider. You may need to reschedule your appointment if you arrive late (15 or more minutes).  Arriving late affects you and other patients whose appointments are after yours.  Also, if you miss three or more appointments without notifying the office, you may be dismissed from the clinic at the provider's discretion.      For prescription refill requests, have your pharmacy contact our office and allow 72 hours for refills to be completed.    Today you received the following chemotherapy and/or immunotherapy agents cabazataxel    To help prevent nausea and vomiting after your treatment, we encourage you to take your nausea medication as directed.  BELOW ARE SYMPTOMS THAT SHOULD BE REPORTED IMMEDIATELY: *FEVER GREATER THAN 100.4 F (38 C) OR HIGHER *CHILLS OR SWEATING *NAUSEA AND VOMITING THAT IS NOT CONTROLLED WITH YOUR NAUSEA MEDICATION *UNUSUAL SHORTNESS OF BREATH *UNUSUAL BRUISING OR BLEEDING *URINARY PROBLEMS (pain or burning when urinating, or frequent urination) *BOWEL PROBLEMS (unusual diarrhea, constipation, pain near the anus) TENDERNESS IN MOUTH AND THROAT WITH OR WITHOUT PRESENCE OF ULCERS (sore throat, sores in mouth, or a toothache) UNUSUAL RASH, SWELLING OR PAIN  UNUSUAL VAGINAL DISCHARGE OR ITCHING   Items with * indicate a potential emergency and should be followed up as soon as possible or go to the  Emergency Department if any problems should occur.  Please show the CHEMOTHERAPY ALERT CARD or IMMUNOTHERAPY ALERT CARD at check-in to the Emergency Department and triage nurse.  Should you have questions after your visit or need to cancel or reschedule your appointment, please contact [facility] [phone]  and follow the prompts.  Office hours are 8:00 a.m. to 4:30 p.m. Monday - Friday. Please note that voicemails left after 4:00 p.m. may not be returned until the following business day.  We are closed weekends and major holidays. You have access to a nurse at all times for urgent questions. Please call the main number to the clinic [phone] and follow the prompts.  For any non-urgent questions, you may also contact your provider using MyChart. We now offer e-Visits for anyone 64 and older to request care online for non-urgent symptoms. For details visit [email or web address].   Also download the MyChart app! Go to the app store, search "MyChart", open the app, select [place], and log in with your MyChart username and password.

## 2023-04-17 NOTE — Progress Notes (Signed)
Treatment given today per MD orders.  Stable during infusion without adverse affects.  Vital signs stable.  No complaints at this time.  Discharge from clinic ambulatory in stable condition.  Alert and oriented X 3.  Follow up with [facility] as scheduled.  

## 2023-04-26 ENCOUNTER — Encounter: Payer: Self-pay | Admitting: Behavioral Health

## 2023-05-07 ENCOUNTER — Inpatient Hospital Stay: Payer: Medicare Other

## 2023-05-08 ENCOUNTER — Inpatient Hospital Stay: Payer: Medicare Other | Admitting: Hematology

## 2023-05-08 ENCOUNTER — Inpatient Hospital Stay: Payer: Medicare Other

## 2023-05-10 ENCOUNTER — Ambulatory Visit (HOSPITAL_COMMUNITY)
Admission: RE | Admit: 2023-05-10 | Discharge: 2023-05-10 | Disposition: A | Discharge status: Home / Self Care | Payer: Medicare Other | Source: Ambulatory Visit | Attending: Hematology | Admitting: Hematology

## 2023-05-10 ENCOUNTER — Ambulatory Visit: Payer: Medicare Other | Admitting: Behavioral Health

## 2023-05-10 DIAGNOSIS — C61 Malignant neoplasm of prostate: Secondary | ICD-10-CM | POA: Insufficient documentation

## 2023-05-10 NOTE — Consult Note (Signed)
Chief Complaint: [72 year old male with metastatic prostate carcinoma. 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  Initial diagnosis 2014 with Gleason 4 +48 adenocarcinoma.  PSA equal 78.     External beam radiation receive 2014 and antrum perforation.     Recurrence with bone metastasis demonstrated on Axumin PET scan 2021.  T11 metastatic disease.     Most recent carbotaxol therapy in 11/21/2022      Past Medical History:  Diagnosis Date   Anxiety    Bilateral hydronephrosis    Bladder incontinence    night time,  past 2 months   BPH (benign prostatic hypertrophy) with urinary obstruction    Depression    ED (erectile dysfunction)    H/O ascites    trace   Hypertension    Port-A-Cath in place 11/13/2022   Prostate cancer (HCC) 09/24/2012   gleason 4+3=7., & 4+4=8,PSA=67.30, volume=33.7cc    Past Surgical History:  Procedure Laterality Date   IR IMAGING GUIDED PORT INSERTION  11/16/2022   PROSTATE BIOPSY  09/24/12   Adenocarcinoma   PROSTATE BIOPSY N/A 12/20/2017   Procedure: BIOPSY TRANSRECTAL ULTRASONIC PROSTATE (TUBP);  Surgeon: Marcine Matar, MD;  Location: [facility];  Service: Urology;  Laterality: N/A;   TONSILLECTOMY      Allergies: Patient has no known allergies.  Medications: Prior to Admission medications   Medication Sig Start Date End Date Taking? Authorizing Provider  Ascorbic Acid (VITAMIN C) 1000 MG tablet Take 1,000 mg by mouth daily.    [provider]  b complex vitamins capsule Take 1 capsule by mouth daily.    [provider]  Bacillus Coagulans-Inulin (PROBIOTIC) 1-250 BILLION-MG CAPS Take by mouth daily.     [provider]  Cabazitaxel (JEVTANA IV) Inject into the vein every  21 ( twenty-one) days. 11/21/22   [provider]  Cholecalciferol 125 MCG (5000 UT) capsule Take 5,000 Units by mouth daily.     [provider]  Ginseng 100 MG CAPS Take by mouth daily.     [provider]  L-ARGININE PO Take 1,000 mg by mouth daily.     [provider]  lidocaine-prilocaine (EMLA) cream Apply a small amount to port a cath site and cover with plastic wrap one hour prior to infusion appointments 11/13/22   Doreatha Massed, MD  losartan (COZAAR) 25 MG tablet TAKE 1 TABLET(25 MG) BY MOUTH DAILY 02/12/23   Doreatha Massed, MD  melatonin 1 MG TABS tablet Take 2 mg by mouth at bedtime.     [provider]  methylphenidate (RITALIN) 5 MG tablet Take 0.5 tablets (2.5 mg total) by mouth daily as needed. 07/12/21   Doreatha Massed, MD  ondansetron (ZOFRAN) 4 MG tablet Take 1 tablet (4 mg total) by mouth every 8 (eight) hours as needed for nausea or vomiting. 03/27/23   Doreatha Massed, MD  Probiotic Product (ALIGN) 4 MG CAPS Take 1 capsule by mouth daily.     [provider]  prochlorperazine (COMPAZINE) 10 MG tablet Take 1 tablet (10 mg total) by mouth every 6 (six) hours as needed for nausea or vomiting. 11/13/22   Doreatha Massed, MD  tamsulosin (FLOMAX) 0.4 MG CAPS capsule TAKE 1 CAPSULE(0.4 MG) BY MOUTH DAILY 02/12/23   Doreatha Massed, MD  zinc gluconate 50 MG  tablet Take 50 mg by mouth daily.    [provider]     Family History  Problem Relation Age of Onset   Depression Mother    Dementia Mother    Anxiety disorder Mother    Prostate cancer Father        unconfirmed diagnosis    Social History   Socioeconomic History   Marital status: Single    Spouse name: Not on file   Number of children: 0   Years of education: Not on file   Highest education level: Not on file  Occupational History   Occupation: [name]    Employer: [employer]  Tobacco Use   Smoking  status: Never   Smokeless tobacco: Never  Vaping Use   Vaping status: Never Used  Substance and Sexual Activity   Alcohol use: Yes    Alcohol/week: 2.0 standard drinks of alcohol    Types: 2 Cans of beer per week    Comment: 3 per day, beer/wine , hx etoh abuse years ago   Drug use: No    Comment: hx   Sexual activity: Yes    Birth control/protection: Condom  Other Topics Concern   Not on file  Social History Narrative   Not on file   Social Determinants of Health   Financial Resource Strain: Low Risk  (12/08/2019)   Overall Financial Resource Strain (CARDIA)    Difficulty of Paying Living Expenses: Not very hard  Food Insecurity: No Food Insecurity (12/08/2019)   Hunger Vital Sign    Worried About Running Out of Food in the Last Year: Never true    Ran Out of Food in the Last Year: Never true  Transportation Needs: No Transportation Needs (12/08/2019)   PRAPARE - Administrator, Civil Service (Medical): No    Lack of Transportation (Non-Medical): No  Physical Activity: Sufficiently Active (12/08/2019)   Exercise Vital Sign    Days of Exercise per Week: 6 days    Minutes of Exercise per Session: 60 min  Stress: Stress Concern Present (12/08/2019)   [employer] of Occupational Health - Occupational Stress Questionnaire    Feeling of Stress : To some extent  Social Connections: Unknown (12/08/2019)   Social Connection and Isolation Panel [NHANES]    Frequency of Communication with Friends and Family: Twice a week    Frequency of Social Gatherings with Friends and Family: Once a week    Attends Religious Services: More than 4 times per year    Active Member of [employer] or Organizations: No    Attends Engineer, structural: More than 4 times per year    Marital Status: Not on file    ECOG Status: 1 - Symptomatic but completely ambulatory  Review of Systems:   [Generalized "awful"  feeling.  Fatigue.]  Vital Signs: There were no vitals taken for this  visit.  Physical Exam Thin male. Pale.  PE deferred for COVID and immune compromise per patientl    Imaging: No results found.  Labs:  CBC: Recent Labs    03/05/23 1411 03/22/23 1348 03/27/23 1035 04/16/23 1341  WBC 3.6* 2.1* 3.5* 4.1  HGB 13.8 12.8* 13.7 13.6  HCT 42.3 40.0 42.7 42.5  PLT 272 250 287 306    COAGS: No results for input(s): "INR", "APTT" in the last 8760 hours.  BMP: Recent Labs    02/12/23 1405 03/05/23 1411 03/22/23 1348 04/16/23 1341  NA 138 136 136 136  K 4.2  4.3 4.2 4.1  CL 102 102 101 102  CO2 27 28 27 28   GLUCOSE 88 101* 115* 102*  BUN 13 12 13 14   CALCIUM 9.2 9.0 8.8* 8.8*  CREATININE 1.03 1.04 0.97 0.99  GFRNONAA >60 >60 >60 >60    LIVER FUNCTION TESTS: Recent Labs    02/12/23 1405 03/05/23 1411 03/22/23 1348 04/16/23 1341  BILITOT 0.4 0.8 0.4 0.4  AST 20 20 17 18   ALT 14 15 15 15   ALKPHOS 49 53 44 50  PROT 6.7 6.9 6.3* 6.4*  ALBUMIN 3.9 4.0 3.8 3.9    TUMOR MARKERS:   PSA = 5.27  Assessment and Plan:  [Patient is adequate candidate Lu 177 PSMA therapy ( vipivotide tetraxetan).  Patient demonstrates mild- progression metastatic skeletal disease] identified on recent PSMA PET scan.   Additionally patient's PSA is midly elevated   Patient has demonstrated progression on androgen deprivation and taxane chemotherapy.   Patient explained major and minor risks and benefits of therapy.  Major benefit being progression-free survival.  Major risk being myelosuppression and renal toxicity. Minor toxicity of xerostomia.  All the patient's questions were answered.   Patient is scheduled for 6 treatments spaced 6 weeks apart.  Recommend following up with oncologist for CBC and CMP 1 week prior to each treatment to assess safety of continuing with therapy.      Thank you for this interesting consult.  I greatly enjoyed meeting Nathan Smith and look forward to participating in their care.  A copy of this report was sent to the  requesting provider on this date.  Electronically Signed: Patriciaann Clan, MD 05/10/2023, 3:45 PM   I spent a total of  30 Minutes   in face to face in clinical consultation, greater than 50% of which was counseling/coordinating care for metastatic neuroendocrine tumor.

## 2023-05-11 ENCOUNTER — Other Ambulatory Visit: Payer: Self-pay | Admitting: Hematology

## 2023-05-11 ENCOUNTER — Inpatient Hospital Stay: Payer: Medicare Other | Attending: Hematology | Admitting: Licensed Clinical Social Worker

## 2023-05-11 DIAGNOSIS — R530 Neoplastic (malignant) related fatigue: Secondary | ICD-10-CM | POA: Insufficient documentation

## 2023-05-11 DIAGNOSIS — C61 Malignant neoplasm of prostate: Secondary | ICD-10-CM

## 2023-05-11 DIAGNOSIS — Z1509 Genetic susceptibility to other malignant neoplasm: Secondary | ICD-10-CM | POA: Insufficient documentation

## 2023-05-11 DIAGNOSIS — I10 Essential (primary) hypertension: Secondary | ICD-10-CM | POA: Insufficient documentation

## 2023-05-11 DIAGNOSIS — C7951 Secondary malignant neoplasm of bone: Secondary | ICD-10-CM | POA: Insufficient documentation

## 2023-05-11 DIAGNOSIS — Z79899 Other long term (current) drug therapy: Secondary | ICD-10-CM | POA: Insufficient documentation

## 2023-05-11 DIAGNOSIS — K5900 Constipation, unspecified: Secondary | ICD-10-CM | POA: Insufficient documentation

## 2023-05-11 DIAGNOSIS — C772 Secondary and unspecified malignant neoplasm of intra-abdominal lymph nodes: Secondary | ICD-10-CM | POA: Insufficient documentation

## 2023-05-11 DIAGNOSIS — R109 Unspecified abdominal pain: Secondary | ICD-10-CM | POA: Insufficient documentation

## 2023-05-11 DIAGNOSIS — R110 Nausea: Secondary | ICD-10-CM | POA: Insufficient documentation

## 2023-05-11 NOTE — Progress Notes (Signed)
[facility] CSW Progress Note  Visual merchandiser  spoke w/ pt over the phone regarding emotional and financial support.  Pt will be changing treatment within the next two weeks and is experiencing anxiety regarding the change and overall quality of life at present.  Pt continues to see a counselor which he states is somewhat helpful.  Pt reports he is fatigued much of the time and has little energy to do things he once enjoyed.  CSW discussed applying for Medicaid and SSI as it may help w/ the finances.  CSW also sent pt the link to [employer] to apply for grants which may offset out of pocket medical expenses.  Trying an anti-depressant discussed at length.  Pt will start Pluvicto treatment and possibly consider an anti-depressant if the change in treatment is not easier to tolerate.  Pt states he was under the impression he would stop chemotherapy if he started Pluvicto; however, chemotherapy appointments are still on his calendar.  CSW sent a message to the RN navigator to address w/ pt.  CSW to remain available as appropriate throughout duration of treatment to provide support.        Rachel Moulds, LCSW Clinical Social Worker [facility]

## 2023-05-12 ENCOUNTER — Other Ambulatory Visit: Payer: Self-pay

## 2023-05-15 ENCOUNTER — Encounter: Payer: Self-pay | Admitting: Hematology

## 2023-05-15 ENCOUNTER — Encounter (HOSPITAL_COMMUNITY): Payer: Self-pay | Admitting: Hematology

## 2023-05-16 ENCOUNTER — Inpatient Hospital Stay: Payer: Medicare Other | Admitting: Hematology

## 2023-05-16 ENCOUNTER — Inpatient Hospital Stay: Payer: Medicare Other

## 2023-05-17 ENCOUNTER — Inpatient Hospital Stay: Payer: Medicare Other

## 2023-05-23 ENCOUNTER — Other Ambulatory Visit: Payer: Medicare Other

## 2023-05-23 ENCOUNTER — Inpatient Hospital Stay: Payer: Medicare Other

## 2023-05-23 DIAGNOSIS — R110 Nausea: Secondary | ICD-10-CM | POA: Diagnosis not present

## 2023-05-23 DIAGNOSIS — Z79899 Other long term (current) drug therapy: Secondary | ICD-10-CM | POA: Diagnosis not present

## 2023-05-23 DIAGNOSIS — C61 Malignant neoplasm of prostate: Secondary | ICD-10-CM | POA: Diagnosis present

## 2023-05-23 DIAGNOSIS — I10 Essential (primary) hypertension: Secondary | ICD-10-CM | POA: Diagnosis not present

## 2023-05-23 DIAGNOSIS — R530 Neoplastic (malignant) related fatigue: Secondary | ICD-10-CM | POA: Diagnosis not present

## 2023-05-23 DIAGNOSIS — C7951 Secondary malignant neoplasm of bone: Secondary | ICD-10-CM | POA: Diagnosis present

## 2023-05-23 DIAGNOSIS — Z1509 Genetic susceptibility to other malignant neoplasm: Secondary | ICD-10-CM | POA: Diagnosis not present

## 2023-05-23 DIAGNOSIS — K5900 Constipation, unspecified: Secondary | ICD-10-CM | POA: Diagnosis not present

## 2023-05-23 DIAGNOSIS — R109 Unspecified abdominal pain: Secondary | ICD-10-CM | POA: Diagnosis not present

## 2023-05-23 DIAGNOSIS — Z95828 Presence of other vascular implants and grafts: Secondary | ICD-10-CM

## 2023-05-23 DIAGNOSIS — C772 Secondary and unspecified malignant neoplasm of intra-abdominal lymph nodes: Secondary | ICD-10-CM | POA: Diagnosis not present

## 2023-05-23 LAB — COMPREHENSIVE METABOLIC PANEL
ALT: 16 U/L (ref 0–44)
AST: 21 U/L (ref 15–41)
Albumin: 4.2 g/dL (ref 3.5–5.0)
Alkaline Phosphatase: 51 U/L (ref 38–126)
Anion gap: 9 (ref 5–15)
BUN: 14 mg/dL (ref 8–23)
CO2: 26 mmol/L (ref 22–32)
Calcium: 9.4 mg/dL (ref 8.9–10.3)
Chloride: 102 mmol/L (ref 98–111)
Creatinine, Ser: 0.99 mg/dL (ref 0.61–1.24)
GFR, Estimated: >60 mL/min (ref >60)
Glucose, Bld: 128 mg/dL — ABNORMAL HIGH (ref 70–99)
Potassium: 4.1 mmol/L (ref 3.5–5.1)
Sodium: 137 mmol/L (ref 135–145)
Total Bilirubin: 0.8 mg/dL (ref 0.3–1.2)
Total Protein: 7 g/dL (ref 6.5–8.1)

## 2023-05-23 LAB — CBC WITH DIFFERENTIAL/PLATELET
Abs Immature Granulocytes: 0.01 10*3/uL (ref 0.00–0.07)
Basophils Absolute: 0.1 10*3/uL (ref 0.0–0.1)
Basophils Relative: 1 %
Eosinophils Absolute: 0.1 10*3/uL (ref 0.0–0.5)
Eosinophils Relative: 3 %
HCT: 42 % (ref 39.0–52.0)
Hemoglobin: 13.5 g/dL (ref 13.0–17.0)
Immature Granulocytes: 0 %
Lymphocytes Relative: 15 %
Lymphs Abs: 0.7 10*3/uL (ref 0.7–4.0)
MCH: 31.8 pg (ref 26.0–34.0)
MCHC: 32.1 g/dL (ref 30.0–36.0)
MCV: 98.8 fL (ref 80.0–100.0)
Monocytes Absolute: 0.4 10*3/uL (ref 0.1–1.0)
Monocytes Relative: 8 %
Neutro Abs: 3.5 10*3/uL (ref 1.7–7.7)
Neutrophils Relative %: 73 %
Platelets: 256 10*3/uL (ref 150–400)
RBC: 4.25 MIL/uL (ref 4.22–5.81)
RDW: 15.6 % — ABNORMAL HIGH (ref 11.5–15.5)
WBC: 4.8 10*3/uL (ref 4.0–10.5)
nRBC: 0 % (ref 0.0–0.2)

## 2023-05-23 LAB — MAGNESIUM: Magnesium: 2.4 mg/dL (ref 1.7–2.4)

## 2023-05-23 LAB — PSA: Prostatic Specific Antigen: 5.75 ng/mL — ABNORMAL HIGH (ref 0.00–4.00)

## 2023-05-23 NOTE — Progress Notes (Signed)
[facility] [address] [place] [address]    Clinic Day:  05/24/23    Referring physician: Doreatha Massed, MD  Patient Care Team: Patient, No Pcp Per as PCP - General (General Practice) Doreatha Massed, MD as Consulting Physician (Oncology) Mickie Bail, RN as Oncology Nurse Navigator (Oncology)   ASSESSMENT & PLAN:   Assessment: 1.  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 -External beam radiation with 2 years of Lupron from 05/27/2013 through 07/28/2013.  Received Lupron until 09/28/2014. -Rising PSA levels in December 2018, of 2.8.  Local recurrence was confirmed with biopsy. -Bone scan on 04/12/2018 did not show any evidence of metastatic disease. -He was evaluated by Dr. Greggory Stallion at [facility].  Bone scan and CT scan at [facility] on 07/02/2018 did not show any evidence of metastatic disease.  Left para-aortic lymph node measures 8 mm.  Lupron started for nonmetastatic HSPC in November 2019 for rapid PSA doubling time of less than 3 months.  He had difficulty tolerating Lupron with irritability, low energy and fatigue. -PSA 5.7 (07/08/2019), PSA 11.1 (11/26/2019) with testosterone 18. -Fluciclovine PET CT scan on 12/23/2019 showed T11 meta stasis.  Asymmetric hypermetabolism within the prostate suspicious for residual disease.  Mild enlargement of 2 adjacent lower left para-aortic nodes, largest 8 mm with SUV of 3.4.  These nodes measured maximally 5 mm on PET scan on 02/25/2018. -Abiraterone 500 mg daily and prednisone from 01/03/2020 through 02/16/2020, discontinued secondary to elevated LFTs. -We will consider checking germline and somatic mutation testing. -Lupron 45 mg on 02/17/2020 at Dr. Lenoria Chime office. -Enzalutamide from 03/10/2020 through 10/17/2022 with progression. - Guardant360: MSI high not detected. NOTCH1 mutation. - Recommended cabazitaxel to minimize  incidence of neuropathy as he is a Technical sales engineer and plays guitar. - Cycle 1 cabazitaxel on 11/21/2022, cycle 8 on 04/17/2023, held due to poor tolerance including fatigue, mood changes.   2.  Heterozygosity for MUTYH mutation: - Cologuard test was refused previously.    Plan: 1.  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 - He met with Dr. Amil Amen for discussion about Pluvicto as he is having difficulty tolerating cabazitaxel even though it is helping. - Reviewed labs from 05/23/2023: Normal LFTs and creatinine.  CBC was normal.  PSA was 5.75, slightly up from 5.27 on 04/16/2023. - We will put cabazitaxel on hold at this time. - He will proceed with Pluvicto cycle 1 on 05/31/2023.  We discussed side effects of Pluvicto again.  He will come back to have his labs checked on 07/03/2023 1 week prior to second infusion of Pluvicto.  RTC on 08/16/2023 with PSA level. - In the past when he was taking Zytiga, we had to stop it because of elevated LFTs.  He reports that he was drinking alcohol at that time.  If he is not able to tolerate Pluvicto, he would like to try Zytiga at lower dose.  I think it is reasonable to try it.   2.  Bone metastasis: - Denosumab was discussed and refused previously. - Continue calcium and vitamin D supplements.   3.  Hypertension: - Continue losartan 25 mg daily.  Blood pressure is 148/90.   4.  Cancer-related fatigue: - Continue 1/8 of Ritalin tablet as needed on Sundays.  5.  Nausea: - Continue Zofran 4 mg every 8 hours as  needed.    No orders of the defined types were placed in this encounter.     [name],acting as a Neurosurgeon for Doreatha Massed, MD.,have documented all relevant documentation on the behalf of Doreatha Massed, MD,as directed by  Doreatha Massed, MD while in the presence of Doreatha Massed, MD.  I, Doreatha Massed MD, have reviewed the above documentation for  accuracy and completeness, and I agree with the above.        Doreatha Massed, MD   10/17/20245:51 PM  CHIEF COMPLAINT:   Diagnosis: castration resistant prostate cancer    Cancer Staging  Prostate cancer [facility]) Staging form: Prostate, AJCC 7th Edition - Clinical stage from 12/26/2019: Stage IV (yTX, N1, M1b, PSA: 10 to 19, Gleason 8-10) - Signed by Doreatha Massed, MD on 12/26/2019    Prior Therapy: 1. XRT 05/27/2013 - 07/28/2013  2. Lupron 03/2013 - 09/28/14 3. Abiraterone 12/24/19 - 02/16/20 4. enzalutamide 80 mg QD 03/10/20 - 10/17/22  Current Therapy:  cabazitaxel; Lupron every 6 months    HISTORY OF PRESENT ILLNESS:   Oncology History  Prostate cancer (HCC)  09/24/2012 Initial Diagnosis   Prostate cancer (HCC)   12/26/2019 Cancer Staging   Staging form: Prostate, AJCC 7th Edition - Clinical stage from 12/26/2019: Stage IV (yTX, N1, M1b, PSA: 10 to 19, Gleason 8-10) - Signed by Doreatha Massed, MD on 12/26/2019   08/24/2020 Genetic Testing   Positive genetic testing:  A single, 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 Testing was completed through the Common Hereditary Cancers panel and Prostate Cancer HRR panel offered by [facility] laboratories. The report date is 08/24/2020.   The Common Hereditary Cancers Panel offered by [facility] includes sequencing and/or deletion duplication testing of the following 47 genes: APC, ATM, AXIN2, BARD1, BMPR1A, BRCA1, BRCA2, BRIP1, CDH1, CDK4, CDKN2A (p14ARF), CDKN2A (p16INK4a), CHEK2, CTNNA1, DICER1, EPCAM (Deletion/duplication testing only), GREM1 (promoter region deletion/duplication testing only), KIT, MEN1, MLH1, MSH2, MSH3, MSH6, MUTYH, NBN, NF1, NTHL1, PALB2, PDGFRA, PMS2, POLD1, POLE, PTEN, RAD50, RAD51C, RAD51D, SDHB, SDHC, SDHD, SMAD4, SMARCA4. STK11, TP53, TSC1, TSC2, and VHL.  The following genes were evaluated for sequence changes only: SDHA and HOXB13 c.251G>A variant only. The  Prostate Cancer HRR Panel offered by [facility] includes sequencing and/or deletion/duplication analysis of the following 10 genes: ATM, BARD1, BRCA1, BRCA2, BRIP1, CHEK2, FANCL, PALB2, RAD51C, and RAD51D.   11/21/2022 -  Chemotherapy   Patient is on Treatment Plan : PROSTATE Cabazitaxel (20) D1 + Prednisone D1-21 q21d        INTERVAL HISTORY:   Nathan Smith is a 72 y.o. male presenting to clinic today for follow up of castration resistant prostate cancer. He was last seen by me on 04/17/23.  Since his last visit, he was seen my Dr. Amil Amen, a radiologist, on 05/10/23. He was found to be a candidate for Lu 177 PSMA therapy (vipivotide tetraxetan) and is scheduled for 6 treatments spaced 6 weeks apart, following-up with me 1 week prior to each treatment. He has an appointment on 05/31/23 for his first treatment.   Today, he states that he is doing well overall. His appetite level is at 100%. His energy level is at 50-60%.    Patient reports he would like to try Zytiga again instead of chemotherapy after Pluvicto treatment finishes if prostate cancer has a reoccurrence. He denies any changes in medication. He has not had to take Zofran for the last week. He reports tingling in the fingers, nausea, constipation, and generalized  weakness have improved during the last week.  He believes that Lupron shots are causing severe mood swings.   PAST MEDICAL HISTORY:   Past Medical History: Past Medical History:  Diagnosis Date   Anxiety    Bilateral hydronephrosis    Bladder incontinence    night time,  past 2 months   BPH (benign prostatic hypertrophy) with urinary obstruction    Depression    ED (erectile dysfunction)    H/O ascites    trace   Hypertension    Port-A-Cath in place 11/13/2022   Prostate cancer (HCC) 09/24/2012   gleason 4+3=7., & 4+4=8,PSA=67.30, volume=33.7cc    Surgical History: Past Surgical History:  Procedure Laterality Date   IR IMAGING GUIDED PORT INSERTION  11/16/2022    PROSTATE BIOPSY  09/24/12   Adenocarcinoma   PROSTATE BIOPSY N/A 12/20/2017   Procedure: BIOPSY TRANSRECTAL ULTRASONIC PROSTATE (TUBP);  Surgeon: Marcine Matar, MD;  Location: [facility];  Service: Urology;  Laterality: N/A;   TONSILLECTOMY      Social History: Social History   Socioeconomic History   Marital status: Single    Spouse name: Not on file   Number of children: 0   Years of education: Not on file   Highest education level: Not on file  Occupational History   Occupation: [name]    Employer: [employer]  Tobacco Use   Smoking status: Never   Smokeless tobacco: Never  Vaping Use   Vaping status: Never Used  Substance and Sexual Activity   Alcohol use: Yes    Alcohol/week: 2.0 standard drinks of alcohol    Types: 2 Cans of beer per week    Comment: 3 per day, beer/wine , hx etoh abuse years ago   Drug use: No    Comment: hx   Sexual activity: Yes    Birth control/protection: Condom  Other Topics Concern   Not on file  Social History Narrative   Not on file   Social Determinants of Health   Financial Resource Strain: Low Risk  (12/08/2019)   Overall Financial Resource Strain (CARDIA)    Difficulty of Paying Living Expenses: Not very hard  Food Insecurity: No Food Insecurity (12/08/2019)   Hunger Vital Sign    Worried About Running Out of Food in the Last Year: Never true    Ran Out of Food in the Last Year: Never true  Transportation Needs: No Transportation Needs (12/08/2019)   PRAPARE - Administrator, Civil Service (Medical): No    Lack of Transportation (Non-Medical): No  Physical Activity: Sufficiently Active (12/08/2019)   Exercise Vital Sign    Days of Exercise per Week: 6 days    Minutes of Exercise per Session: 60 min  Stress: Stress Concern Present (12/08/2019)   [employer] of Occupational Health - Occupational Stress Questionnaire    Feeling of Stress : To some extent  Social Connections: Unknown (12/08/2019)    Social Connection and Isolation Panel [NHANES]    Frequency of Communication with Friends and Family: Twice a week    Frequency of Social Gatherings with Friends and Family: Once a week    Attends Religious Services: More than 4 times per year    Active Member of [employer] or Organizations: No    Attends Engineer, structural: More than 4 times per year    Marital Status: Not on file  Intimate Partner Violence: Not At Risk (12/08/2019)   Humiliation, Afraid, Rape, and Kick questionnaire  Fear of Current or Ex-Partner: No    Emotionally Abused: No    Physically Abused: No    Sexually Abused: No    Family History: Family History  Problem Relation Age of Onset   Depression Mother    Dementia Mother    Anxiety disorder Mother    Prostate cancer Father        unconfirmed diagnosis    Current Medications:  Current Outpatient Medications:    Ascorbic Acid (VITAMIN C) 1000 MG tablet, Take 1,000 mg by mouth daily., Disp: , Rfl:    b complex vitamins capsule, Take 1 capsule by mouth daily., Disp: , Rfl:    Bacillus Coagulans-Inulin (PROBIOTIC) 1-250 BILLION-MG CAPS, Take by mouth daily. , Disp: , Rfl:    Cholecalciferol 125 MCG (5000 UT) capsule, Take 5,000 Units by mouth daily. , Disp: , Rfl:    Ginseng 100 MG CAPS, Take by mouth daily. , Disp: , Rfl:    L-ARGININE PO, Take 1,000 mg by mouth daily. , Disp: , Rfl:    lidocaine-prilocaine (EMLA) cream, Apply a small amount to port a cath site and cover with plastic wrap one hour prior to infusion appointments, Disp: 30 g, Rfl: 3   losartan (COZAAR) 25 MG tablet, TAKE 1 TABLET(25 MG) BY MOUTH DAILY, Disp: 90 tablet, Rfl: 3   melatonin 1 MG TABS tablet, Take 2 mg by mouth at bedtime. , Disp: , Rfl:    methylphenidate (RITALIN) 5 MG tablet, Take 0.5 tablets (2.5 mg total) by mouth daily as needed., Disp: 10 tablet, Rfl: 0   Probiotic Product (ALIGN) 4 MG CAPS, Take 1 capsule by mouth daily. , Disp: , Rfl:    tamsulosin (FLOMAX) 0.4 MG  CAPS capsule, TAKE 1 CAPSULE(0.4 MG) BY MOUTH DAILY, Disp: 90 capsule, Rfl: 3   zinc gluconate 50 MG tablet, Take 50 mg by mouth daily., Disp: , Rfl:    Cabazitaxel (JEVTANA IV), Inject into the vein every 21 ( twenty-one) days. (Patient not taking: Reported on 05/24/2023), Disp: , Rfl:    ondansetron (ZOFRAN) 4 MG tablet, Take 1 tablet (4 mg total) by mouth every 8 (eight) hours as needed for nausea or vomiting. (Patient not taking: Reported on 05/24/2023), Disp: 60 tablet, Rfl: 2   prochlorperazine (COMPAZINE) 10 MG tablet, Take 1 tablet (10 mg total) by mouth every 6 (six) hours as needed for nausea or vomiting. (Patient not taking: Reported on 05/24/2023), Disp: 60 tablet, Rfl: 1 No current facility-administered medications for this visit.  Facility-Administered Medications Ordered in Other Visits:    fulvestrant (FASLODEX) 250 MG/5ML injection, , , ,    Allergies: No Known Allergies  REVIEW OF SYSTEMS:   Review of Systems  Constitutional:  Negative for chills, fatigue and fever.  HENT:   Negative for lump/mass, mouth sores, nosebleeds, sore throat and trouble swallowing.   Eyes:  Negative for eye problems.  Respiratory:  Negative for cough and shortness of breath.   Cardiovascular:  Negative for chest pain, leg swelling and palpitations.  Gastrointestinal:  Positive for abdominal pain, constipation and nausea. Negative for diarrhea and vomiting.  Genitourinary:  Negative for bladder incontinence, difficulty urinating, dysuria, frequency, hematuria and nocturia.   Musculoskeletal:  Negative for arthralgias, back pain, flank pain, myalgias and neck pain.  Skin:  Negative for itching and rash.  Neurological:  Positive for dizziness and numbness (in hands). Negative for headaches.  Hematological:  Does not bruise/bleed easily.  Psychiatric/Behavioral:  Positive for sleep disturbance. Negative for depression and  suicidal ideas. The patient is not nervous/anxious.   All other systems  reviewed and are negative.    VITALS:   Blood pressure (!) 148/91, pulse 88, temperature (!) 96.3 F (35.7 C), temperature source Oral, resp. rate 16, weight 145 lb (65.8 kg), SpO2 100%.  Wt Readings from Last 3 Encounters:  05/24/23 145 lb (65.8 kg)  04/17/23 145 lb 14.4 oz (66.2 kg)  03/27/23 145 lb 12.8 oz (66.1 kg)    Body mass index is 19.67 kg/m.  Performance status (ECOG): 1 - Symptomatic but completely ambulatory  PHYSICAL EXAM:   Physical Exam Vitals and nursing note reviewed. Exam conducted with a chaperone present.  Constitutional:      Appearance: Normal appearance.  Cardiovascular:     Rate and Rhythm: Normal rate and regular rhythm.     Pulses: Normal pulses.     Heart sounds: Normal heart sounds.  Pulmonary:     Effort: Pulmonary effort is normal.     Breath sounds: Normal breath sounds.  Abdominal:     Palpations: Abdomen is soft. There is no hepatomegaly, splenomegaly or mass.     Tenderness: There is no abdominal tenderness.  Musculoskeletal:     Right lower leg: No edema.     Left lower leg: No edema.  Lymphadenopathy:     Cervical: No cervical adenopathy.     Right cervical: No superficial, deep or posterior cervical adenopathy.    Left cervical: No superficial, deep or posterior cervical adenopathy.     Upper Body:     Right upper body: No supraclavicular or axillary adenopathy.     Left upper body: No supraclavicular or axillary adenopathy.  Neurological:     General: No focal deficit present.     Mental Status: He is alert and oriented to person, place, and time.  Psychiatric:        Mood and Affect: Mood normal.        Behavior: Behavior normal.     LABS:      Latest Ref Rng & Units 05/23/2023    3:45 PM 04/16/2023    1:41 PM 03/27/2023   10:35 AM  CBC  WBC 4.0 - 10.5 K/uL 4.8  4.1  3.5   Hemoglobin 13.0 - 17.0 g/dL 65.7  84.6  96.2   Hematocrit 39.0 - 52.0 % 42.0  42.5  42.7   Platelets 150 - 400 K/uL 256  306  287       Latest  Ref Rng & Units 05/23/2023    3:45 PM 04/16/2023    1:41 PM 03/22/2023    1:48 PM  CMP  Glucose 70 - 99 mg/dL 952  841  324   BUN 8 - 23 mg/dL 14  14  13    Creatinine 0.61 - 1.24 mg/dL 4.01  0.27  2.53   Sodium 135 - 145 mmol/L 137  136  136   Potassium 3.5 - 5.1 mmol/L 4.1  4.1  4.2   Chloride 98 - 111 mmol/L 102  102  101   CO2 22 - 32 mmol/L 26  28  27    Calcium 8.9 - 10.3 mg/dL 9.4  8.8  8.8   Total Protein 6.5 - 8.1 g/dL 7.0  6.4  6.3   Total Bilirubin 0.3 - 1.2 mg/dL 0.8  0.4  0.4   Alkaline Phos 38 - 126 U/L 51  50  44   AST 15 - 41 U/L 21  18  17    ALT 0 -  44 U/L 16  15  15       No results found for: "CEA1", "CEA" / No results found for: "CEA1", "CEA" Lab Results  Component Value Date   PSA1 0.3 08/30/2021   No results found for: "ZOX096" No results found for: "CAN125"  No results found for: "TOTALPROTELP", "ALBUMINELP", "A1GS", "A2GS", "BETS", "BETA2SER", "GAMS", "MSPIKE", "SPEI" No results found for: "TIBC", "FERRITIN", "IRONPCTSAT" Lab Results  Component Value Date   LDH 129 03/22/2020     STUDIES:   NM Radiologist Eval And Mgmt  Result Date: 05/10/2023 EXAM: NEW PATIENT OFFICE VISIT CHIEF COMPLAINT: 72 year old male with metastatic prostate carcinoma. Castrate resistant prostate carcinoma which is positive on PSMA PET scan. Current Pain Level: 1-10 HISTORY OF PRESENT ILLNESS: A PSMA PET scan 10/26/2022 demonstrates T11 metastasis and potential retroperitoneal nodal metastasis. REVIEW OF SYSTEMS: Generalized "awful"  feeling.  Fatigue. PHYSICAL EXAMINATION: Comprehensive, 8 or more Systems ASSESSMENT AND PLAN: See epic note. Electronically Signed   By: Genevive Bi M.D.   On: 05/10/2023 15:55

## 2023-05-24 ENCOUNTER — Inpatient Hospital Stay: Payer: Medicare Other | Admitting: Hematology

## 2023-05-24 ENCOUNTER — Inpatient Hospital Stay: Payer: Medicare Other

## 2023-05-24 VITALS — BP 148/91 | HR 88 | Temp 96.3°F | Resp 16 | Wt 145.0 lb

## 2023-05-24 DIAGNOSIS — C61 Malignant neoplasm of prostate: Secondary | ICD-10-CM | POA: Diagnosis not present

## 2023-05-24 NOTE — Patient Instructions (Signed)
[facility]  Discharge Instructions  You were seen and examined today by Dr. Ellin Saba.  Dr. Ellin Saba discussed your most recent lab work which is stable. Proceed with your Pluvicto treatment as scheduled next week.  Follow-up as scheduled.  Thank you for choosing [facility] to provide your oncology and hematology care.   To afford each patient quality time with our provider, please arrive at least 15 minutes before your scheduled appointment time. You may need to reschedule your appointment if you arrive late (10 or more minutes). Arriving late affects you and other patients whose appointments are after yours.  Also, if you miss three or more appointments without notifying the office, you may be dismissed from the clinic at the provider's discretion.    Again, thank you for choosing [facility].  Our hope is that these requests will decrease the amount of time that you wait before being seen by our physicians.   If you have a lab appointment with the Cancer Center - please note that after April 8th, all labs will be drawn in the cancer center.  You do not have to check in or register with the main entrance as you have in the past but will complete your check-in at the cancer center.            _____________________________________________________________  Should you have questions after your visit to [facility], please contact our office at [phone] and follow the prompts.  Our office hours are 8:00 a.m. to 4:30 p.m. Monday - Thursday and 8:00 a.m. to 2:30 p.m. Friday.  Please note that voicemails left after 4:00 p.m. may not be returned until the following business day.  We are closed weekends and all major holidays.  You do have access to a nurse 24-7, just call the main number to the clinic [phone] and do not press any options, hold on the line and a nurse will answer the phone.    For  prescription refill requests, have your pharmacy contact our office and allow 72 hours.    Masks are no longer required in the cancer centers. If you would like for your care team to wear a mask while they are taking care of you, please let them know. You may have one support person who is at least 70 years old accompany you for your appointments.

## 2023-05-28 ENCOUNTER — Inpatient Hospital Stay: Payer: Medicare Other

## 2023-05-29 ENCOUNTER — Inpatient Hospital Stay: Payer: Medicare Other

## 2023-05-29 ENCOUNTER — Inpatient Hospital Stay: Payer: Medicare Other | Admitting: Hematology

## 2023-05-31 ENCOUNTER — Ambulatory Visit (HOSPITAL_COMMUNITY)
Admission: RE | Admit: 2023-05-31 | Discharge: 2023-05-31 | Disposition: A | Discharge status: Home / Self Care | Payer: Medicare Other | Source: Ambulatory Visit | Attending: Hematology | Admitting: Hematology

## 2023-05-31 DIAGNOSIS — C61 Malignant neoplasm of prostate: Secondary | ICD-10-CM | POA: Diagnosis present

## 2023-05-31 MED ORDER — LUTETIUM LU 177 VIPIVOTIDE TET 1000 MBQ/ML IV SOLN
200.0000 | Freq: Once | INTRAVENOUS | Status: AC
  Administered 2023-05-31: 200 via INTRAVENOUS

## 2023-05-31 MED ORDER — SODIUM CHLORIDE 0.9 % IV BOLUS
1000.0000 mL | Freq: Once | INTRAVENOUS | Status: AC
  Administered 2023-05-31: 1000 mL via INTRAVENOUS

## 2023-05-31 NOTE — Progress Notes (Signed)
Pt tolerated treatment well.

## 2023-05-31 NOTE — Written Directive (Addendum)
  PLUVICTO  THERAPY   RADIOPHARMACEUTICAL: Lutetium 177 vipivotide tetraxetan (Pluvicto)     PRESCRIBED DOSE FOR ADMINISTRATION:  200 mCi   ROUTE OFADMINISTRATION:  IV   DIAGNOSIS: Prostate Cancer   REFERRING PHYSICIAN:Katragadda, Vern Claude, MD    TREATMENT #:1   ADDITIONAL PHYSICIAN COMMENTS/NOTES:   AUTHORIZED USER SIGNATURE & TIME STAMP: Patriciaann Clan, MD   05/31/23    10:08 AM

## 2023-05-31 NOTE — Progress Notes (Signed)
CLINICAL DATA: [72 year old male with castrate resistant metastatic prostate cancer.  Spine metastasis.  Mildly increased PSA.  Trial of taxane chemotherapy.]    EXAM: NUCLEAR MEDICINE PLUVICTO INJECTION  TECHNIQUE: Infusion: The nuclear medicine technologist and I personally verified the dose activity to be delivered as specified in the written directive, and verified the patient identification via 2 separate methods.  Initial flush of the intravenous catheter was performed was sterile saline. The dose syringe was connected to the catheter and the Lu-177 Pluvicto administered over a 1 to 10 min infusion. Single 10 cc  lushes with normal saline follow the dose. No complications were noted. The entire IV tubing, venocatheter, stopcock and syringes was removed in total, placed in a disposal bag and sent for assay of the residual activity, which will be reported at a later time in our EMR by the physics staff. Pressure was applied to the venipuncture site, and a compression bandage placed. Patient monitored for 1 hour following infusion.    Radiation Safety personnel were present to perform the discharge survey, as detailed on their documentation. After a short period of observation, the patient had his IV removed.  RADIOPHARMACEUTICALS: 207] microcuries Lu-177 PLUVICTO  FINDINGS: Current Infusion: [1]  Planned Infusions: 6    Patient presented to nuclear medicine for treatment.   The patient's most recent blood counts were reviewed and remains a good candidate to proceed with Lu-177 Pluvicto.     Normal renal function.  PSA mildly elevated at 5.75.    The patient was situated in an infusion suite with a contact barrier placed under the arm. Intravenous access was established, using sterile technique, and a normal saline infusion from a syringe was started.     Micro-dosimetry: The prescribed radiation activity was assayed and confirmed to be within specified  tolerance.  IMPRESSION: Current Infusion: [1]  Planned Infusions: 6    [The patient tolerated the infusion well. The patient will return in 6 weeks for ongoing care.]

## 2023-06-01 ENCOUNTER — Ambulatory Visit (INDEPENDENT_AMBULATORY_CARE_PROVIDER_SITE_OTHER): Payer: Medicare Other | Admitting: Behavioral Health

## 2023-06-01 ENCOUNTER — Encounter: Payer: Self-pay | Admitting: Behavioral Health

## 2023-06-01 DIAGNOSIS — F4010 Social phobia, unspecified: Secondary | ICD-10-CM

## 2023-06-01 DIAGNOSIS — F411 Generalized anxiety disorder: Secondary | ICD-10-CM | POA: Diagnosis not present

## 2023-06-01 DIAGNOSIS — F331 Major depressive disorder, recurrent, moderate: Secondary | ICD-10-CM

## 2023-06-01 NOTE — Progress Notes (Signed)
[facility] Counselor/Therapist Progress Note  Patient ID: Nathan Smith, MRN: [id number],    Date: 06/01/2023  Time Spent: 58 minutes, from 3:00 PM to 3:58 PM.  This session was held via video teletherapy. The patient consented to the video teletherapy and was located in his home during this session. He is aware it is the responsibility of the patient to secure confidentiality on his end of the session. The provider was in and outpatient therapy office for the duration of this session.   Treatment Type: Individual Therapy  Reported Symptoms: anxiety  Mental Status Exam: Appearance:  Well Groomed     Behavior: Appropriate  Motor: Normal  Speech/Language:  Clear and Coherent  Affect: Appropriate  Mood: anxious  Thought process: normal  Thought content:   WNL  Sensory/Perceptual disturbances:   WNL  Orientation: oriented to person, place, time/date, situation, day of week, and month of year  Attention: Good  Concentration: Good  Memory: WNL  Fund of knowledge:  Good  Insight:   Good  Judgment:  Good  Impulse Control: Good   Risk Assessment: Danger to Self:  No Self-injurious Behavior: No Danger to Others: No Duty to Warn:no Physical Aggression / Violence:No  Access to Firearms a concern: No  Gang Involvement:No   Subjective: The patient had to cancel her previous appointment because he had a consultation with an on oncologist about a new treatment.  The new treatment started yesterday and it will be a radiation treatment every 6 weeks and he can take 6 treatments.  They did not give him any pretreatment medications so he is optimistic that it will not have the side effects of the previous chemo.  He said today feels good.  He they had to change because he was having horrible symptoms in reaction to the chemo and it was not necessarily changing his markers any in a positive way.  He has to stay away from anybody for 7 days after this radiation treatment because of  the strength of it.  He knows is not a cure all but hopes for an improvement in the quality of life with his radiation.  He had concerns about not being able to see him but feels that he probably will even though he cannot be in church this Sunday.  He continues to have days of depression saying he questions if he is making the right course of treatment but says he is pushed to live through that for 10 or 11 years and he will continue to do so.  He does contract for safety having no thoughts of hurting himself or anyone else.  Interventions: Cognitive Behavioral Therapy  Diagnosis: Generalized anxiety disorder, social anxiety disorder  Plan: I will meet with the patient every 3 to 4 weeks preferably in office. Treatment plan: We will use cognitive behavioral therapy as well as elements of dialectical behavior therapy and person centered therapy to help reduce the patient's anxiety by at least 50% by July 07, 2023 as well as support him as he goes to intensive chemotherapy for cancer.  Goals will be to improve his ability to manage anxiety symptoms better, resolve issues contributing to his anxiety including his past and treatment that he is going through as well as helping him manage thoughts and worrisome thinking contributing to feelings of anxiety.  We will facilitate problem solution skills to help him manage anxiety and stress, teach coping skills for managing anxiety and stress as well as using cognitive behavioral therapy to identify  and change anxiety provoking thoughts and behavior patterns.  We will also teach DBT distress tolerance and mindfulness skills to help him learn better anxiety management.  Progress: 30% [name], [facility]                 [name], [facility]               [name], [facility]               [name], [facility]               [name], [facility]               [name],  [facility]

## 2023-06-13 ENCOUNTER — Inpatient Hospital Stay: Payer: Medicare Other

## 2023-06-14 ENCOUNTER — Inpatient Hospital Stay: Payer: Medicare Other

## 2023-06-14 ENCOUNTER — Inpatient Hospital Stay: Payer: Medicare Other | Admitting: Hematology

## 2023-06-29 ENCOUNTER — Ambulatory Visit (INDEPENDENT_AMBULATORY_CARE_PROVIDER_SITE_OTHER): Payer: Medicare Other | Admitting: Behavioral Health

## 2023-06-29 ENCOUNTER — Encounter: Payer: Self-pay | Admitting: Behavioral Health

## 2023-06-29 DIAGNOSIS — F4010 Social phobia, unspecified: Secondary | ICD-10-CM | POA: Diagnosis not present

## 2023-06-29 DIAGNOSIS — F411 Generalized anxiety disorder: Secondary | ICD-10-CM | POA: Diagnosis not present

## 2023-06-29 NOTE — Progress Notes (Signed)
[facility] Counselor/Therapist Progress Note  Patient ID: Nathan Smith, MRN: [id number],    Date: 06/29/2023  Time Spent: 58 minutes, from 1:00 PM to 1:58 PM.  This session was held via video teletherapy. The patient consented to the video teletherapy and was located in his home during this session. He is aware it is the responsibility of the patient to secure confidentiality on his end of the session. The provider was in and outpatient therapy office for the duration of this session.   Treatment Type: Individual Therapy  Reported Symptoms: anxiety  Mental Status Exam: Appearance:  Well Groomed     Behavior: Appropriate  Motor: Normal  Speech/Language:  Clear and Coherent  Affect: Appropriate  Mood: anxious  Thought process: normal  Thought content:   WNL  Sensory/Perceptual disturbances:   WNL  Orientation: oriented to person, place, time/date, situation, day of week, and month of year  Attention: Good  Concentration: Good  Memory: WNL  Fund of knowledge:  Good  Insight:   Good  Judgment:  Good  Impulse Control: Good   Risk Assessment: Danger to Self:  No Self-injurious Behavior: No Danger to Others: No Duty to Warn:no Physical Aggression / Violence:No  Access to Firearms a concern: No  Gang Involvement:No   Subjective: The patient will follow up with his oncologist next week to see how this first of 6 radiation treatments as done.  For the most part the side effects have not been as bad as chemotherapy but he has lost a lot of appetite.  He continues to eat because he knows he needs to.  Things that he really enjoys the most such as grapes coffee etc. do not taste very good to him and he hopes that changes.  He can have this treatment every 6 weeks.  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  He acknowledges days of depression but does not have a lot of motivation or energy to do anything else.   I suggested reaching out to friends for trying to find time to spend with some of the people he knows from her church which she will try to do as he feels strong enough to do. He does contract for safety having no thoughts of hurting himself or anyone else.  Interventions: Cognitive Behavioral Therapy  Diagnosis: Generalized anxiety disorder, social anxiety disorder  Plan: I will meet with the patient every 3 to 4 weeks preferably in office. Treatment plan: We will use cognitive behavioral therapy as well as elements of dialectical behavior therapy and person centered therapy to help reduce the patient's anxiety by at least 50% by July 07, 2023 as well as support him as he goes to intensive chemotherapy for cancer.  Goals will be to improve his ability to manage anxiety symptoms better, resolve issues contributing to his anxiety including his past and treatment that he is going through as well as helping him manage thoughts and worrisome thinking contributing to feelings of anxiety.  We will facilitate problem solution skills to help him manage anxiety and stress, teach coping skills for managing anxiety and stress as well as using cognitive behavioral therapy to identify and change anxiety provoking thoughts and behavior patterns.  We will also teach DBT distress tolerance and mindfulness skills to help him learn better anxiety management.  Progress: 30% agreed to continue with goals as stated with a new target date of Jan 04, 2024. [name], [facility]  [name], [facility]               [name], [facility]               [name], [facility]               [name], [facility]               [name], [facility]               [name], [facility]

## 2023-07-03 ENCOUNTER — Inpatient Hospital Stay: Payer: Medicare Other | Attending: Hematology

## 2023-07-03 DIAGNOSIS — K5900 Constipation, unspecified: Secondary | ICD-10-CM | POA: Diagnosis not present

## 2023-07-03 DIAGNOSIS — Z1509 Genetic susceptibility to other malignant neoplasm: Secondary | ICD-10-CM | POA: Insufficient documentation

## 2023-07-03 DIAGNOSIS — C7951 Secondary malignant neoplasm of bone: Secondary | ICD-10-CM | POA: Diagnosis present

## 2023-07-03 DIAGNOSIS — R530 Neoplastic (malignant) related fatigue: Secondary | ICD-10-CM | POA: Insufficient documentation

## 2023-07-03 DIAGNOSIS — R110 Nausea: Secondary | ICD-10-CM | POA: Diagnosis not present

## 2023-07-03 DIAGNOSIS — I10 Essential (primary) hypertension: Secondary | ICD-10-CM | POA: Diagnosis not present

## 2023-07-03 DIAGNOSIS — C61 Malignant neoplasm of prostate: Secondary | ICD-10-CM | POA: Diagnosis present

## 2023-07-03 DIAGNOSIS — C772 Secondary and unspecified malignant neoplasm of intra-abdominal lymph nodes: Secondary | ICD-10-CM | POA: Diagnosis not present

## 2023-07-03 DIAGNOSIS — Z79899 Other long term (current) drug therapy: Secondary | ICD-10-CM | POA: Diagnosis not present

## 2023-07-03 DIAGNOSIS — R109 Unspecified abdominal pain: Secondary | ICD-10-CM | POA: Insufficient documentation

## 2023-07-03 DIAGNOSIS — Z95828 Presence of other vascular implants and grafts: Secondary | ICD-10-CM

## 2023-07-03 LAB — COMPREHENSIVE METABOLIC PANEL
ALT: 15 U/L (ref 0–44)
AST: 19 U/L (ref 15–41)
Albumin: 4.2 g/dL (ref 3.5–5.0)
Alkaline Phosphatase: 53 U/L (ref 38–126)
Anion gap: 7 (ref 5–15)
BUN: 17 mg/dL (ref 8–23)
CO2: 27 mmol/L (ref 22–32)
Calcium: 9.2 mg/dL (ref 8.9–10.3)
Chloride: 102 mmol/L (ref 98–111)
Creatinine, Ser: 1.07 mg/dL (ref 0.61–1.24)
GFR, Estimated: >60 mL/min (ref >60)
Glucose, Bld: 95 mg/dL (ref 70–99)
Potassium: 4 mmol/L (ref 3.5–5.1)
Sodium: 136 mmol/L (ref 135–145)
Total Bilirubin: 0.6 mg/dL (ref <1.2)
Total Protein: 7.2 g/dL (ref 6.5–8.1)

## 2023-07-03 LAB — CBC WITH DIFFERENTIAL/PLATELET
Abs Immature Granulocytes: 0.01 10*3/uL (ref 0.00–0.07)
Basophils Absolute: 0.1 10*3/uL (ref 0.0–0.1)
Basophils Relative: 1 %
Eosinophils Absolute: 0.1 10*3/uL (ref 0.0–0.5)
Eosinophils Relative: 1 %
HCT: 44.5 % (ref 39.0–52.0)
Hemoglobin: 14.4 g/dL (ref 13.0–17.0)
Immature Granulocytes: 0 %
Lymphocytes Relative: 14 %
Lymphs Abs: 0.8 10*3/uL (ref 0.7–4.0)
MCH: 31.6 pg (ref 26.0–34.0)
MCHC: 32.4 g/dL (ref 30.0–36.0)
MCV: 97.8 fL (ref 80.0–100.0)
Monocytes Absolute: 0.4 10*3/uL (ref 0.1–1.0)
Monocytes Relative: 8 %
Neutro Abs: 4.1 10*3/uL (ref 1.7–7.7)
Neutrophils Relative %: 76 %
Platelets: 233 10*3/uL (ref 150–400)
RBC: 4.55 MIL/uL (ref 4.22–5.81)
RDW: 13.9 % (ref 11.5–15.5)
WBC: 5.5 10*3/uL (ref 4.0–10.5)
nRBC: 0 % (ref 0.0–0.2)

## 2023-07-03 LAB — PSA: Prostatic Specific Antigen: 4.4 ng/mL — ABNORMAL HIGH (ref 0.00–4.00)

## 2023-07-03 LAB — MAGNESIUM: Magnesium: 2.3 mg/dL (ref 1.7–2.4)

## 2023-07-12 ENCOUNTER — Ambulatory Visit (HOSPITAL_COMMUNITY)
Admission: RE | Admit: 2023-07-12 | Discharge: 2023-07-12 | Disposition: A | Discharge status: Home / Self Care | Payer: Medicare Other | Source: Ambulatory Visit | Attending: Hematology | Admitting: Hematology

## 2023-07-12 DIAGNOSIS — C61 Malignant neoplasm of prostate: Secondary | ICD-10-CM | POA: Insufficient documentation

## 2023-07-12 MED ORDER — SODIUM CHLORIDE 0.9 % IV BOLUS: 1000.0000 mL | Freq: Once | INTRAVENOUS | Status: DC

## 2023-07-12 MED ORDER — LUTETIUM LU 177 VIPIVOTIDE TET 1000 MBQ/ML IV SOLN
203.0000 | Freq: Once | INTRAVENOUS | Status: AC
  Administered 2023-07-12: 203 via INTRAVENOUS

## 2023-07-12 NOTE — Progress Notes (Signed)
Tolerated Pluvicto treatment well

## 2023-07-12 NOTE — Written Directive (Addendum)
  PLUVICTO  THERAPY   RADIOPHARMACEUTICAL: Lutetium 177 vipivotide tetraxetan (Pluvicto)     PRESCRIBED DOSE FOR ADMINISTRATION:  200 mCi   ROUTE OFADMINISTRATION:  IV   DIAGNOSIS: Prostate Cancer    REFERRING PHYSICIAN: Doreatha Massed   TREATMENT #: 2   ADDITIONAL PHYSICIAN COMMENTS/NOTES:   AUTHORIZED USER SIGNATURE & TIME STAMP: Patriciaann Clan, MD   07/12/23    10:01 AM

## 2023-07-12 NOTE — Progress Notes (Signed)
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  Initial flush of the intravenous catheter was performed was sterile saline. The dose syringe was connected to the catheter and the Lu-177 Pluvicto administered over a 1 to 10 min infusion. Single 10 cc  lushes with normal saline follow the dose. No complications were noted. The entire IV tubing, venocatheter, stopcock and syringes was removed in total, placed in a disposal bag and sent for assay of the residual activity, which will be reported at a later time in our EMR by the physics staff. Pressure was applied to the venipuncture site, and a compression bandage placed. Patient monitored for 1 hour following infusion.    Radiation Safety personnel were present to perform the discharge survey, as detailed on their documentation. After a short period of observation, the patient had his IV removed.  RADIOPHARMACEUTICALS: [Two hundred three] microcuries Lu-177 PLUVICTO  FINDINGS: Current Infusion: [2]  Planned Infusions: 6    Patient presented to nuclear medicine for treatment.    Patient reports mild interval fatigue several days following initial treatment.   The patient's most recent blood counts were reviewed and remains a good candidate to proceed with Lu-177 Pluvicto.    PSA decreased to 4.4 from 5.75 one month prior.      No evidence of myelo suppression, renal toxicity or hepatic toxicity.     The patient was situated in an infusion suite with a contact barrier placed under the arm. Intravenous access was established, using sterile technique, and a normal saline infusion from a syringe was started.      Micro-dosimetry: The prescribed radiation activity was assayed and confirmed to be within specified tolerance.  IMPRESSION: Current Infusion: [2]  Planned Infusions: 6    [The patient tolerated the infusion well. The patient will return in 6 weeks for ongoing care.]

## 2023-07-14 ENCOUNTER — Other Ambulatory Visit: Payer: Self-pay

## 2023-07-26 ENCOUNTER — Other Ambulatory Visit: Payer: Self-pay | Admitting: *Deleted

## 2023-07-26 DIAGNOSIS — C61 Malignant neoplasm of prostate: Secondary | ICD-10-CM

## 2023-08-15 ENCOUNTER — Inpatient Hospital Stay: Payer: Medicare Other | Attending: Hematology

## 2023-08-15 DIAGNOSIS — C772 Secondary and unspecified malignant neoplasm of intra-abdominal lymph nodes: Secondary | ICD-10-CM | POA: Diagnosis not present

## 2023-08-15 DIAGNOSIS — C61 Malignant neoplasm of prostate: Secondary | ICD-10-CM | POA: Insufficient documentation

## 2023-08-15 DIAGNOSIS — Z1509 Genetic susceptibility to other malignant neoplasm: Secondary | ICD-10-CM | POA: Diagnosis not present

## 2023-08-15 DIAGNOSIS — I10 Essential (primary) hypertension: Secondary | ICD-10-CM | POA: Insufficient documentation

## 2023-08-15 DIAGNOSIS — Z8042 Family history of malignant neoplasm of prostate: Secondary | ICD-10-CM | POA: Diagnosis not present

## 2023-08-15 DIAGNOSIS — Z79899 Other long term (current) drug therapy: Secondary | ICD-10-CM | POA: Diagnosis not present

## 2023-08-15 DIAGNOSIS — R110 Nausea: Secondary | ICD-10-CM | POA: Insufficient documentation

## 2023-08-15 DIAGNOSIS — C7951 Secondary malignant neoplasm of bone: Secondary | ICD-10-CM | POA: Diagnosis present

## 2023-08-15 DIAGNOSIS — Z923 Personal history of irradiation: Secondary | ICD-10-CM | POA: Diagnosis not present

## 2023-08-15 DIAGNOSIS — R530 Neoplastic (malignant) related fatigue: Secondary | ICD-10-CM | POA: Diagnosis not present

## 2023-08-15 LAB — COMPREHENSIVE METABOLIC PANEL
ALT: 15 U/L (ref 0–44)
AST: 21 U/L (ref 15–41)
Albumin: 4.1 g/dL (ref 3.5–5.0)
Alkaline Phosphatase: 60 U/L (ref 38–126)
Anion gap: 8 (ref 5–15)
BUN: 14 mg/dL (ref 8–23)
CO2: 29 mmol/L (ref 22–32)
Calcium: 9.6 mg/dL (ref 8.9–10.3)
Chloride: 97 mmol/L — ABNORMAL LOW (ref 98–111)
Creatinine, Ser: 1.08 mg/dL (ref 0.61–1.24)
GFR, Estimated: >60 mL/min (ref >60)
Glucose, Bld: 134 mg/dL — ABNORMAL HIGH (ref 70–99)
Potassium: 4 mmol/L (ref 3.5–5.1)
Sodium: 134 mmol/L — ABNORMAL LOW (ref 135–145)
Total Bilirubin: 0.7 mg/dL (ref 0.0–1.2)
Total Protein: 7.2 g/dL (ref 6.5–8.1)

## 2023-08-15 LAB — CBC WITH DIFFERENTIAL/PLATELET
Abs Immature Granulocytes: 0.01 10*3/uL (ref 0.00–0.07)
Basophils Absolute: 0 10*3/uL (ref 0.0–0.1)
Basophils Relative: 1 %
Eosinophils Absolute: 0.1 10*3/uL (ref 0.0–0.5)
Eosinophils Relative: 1 %
HCT: 46.8 % (ref 39.0–52.0)
Hemoglobin: 15.2 g/dL (ref 13.0–17.0)
Immature Granulocytes: 0 %
Lymphocytes Relative: 14 %
Lymphs Abs: 0.8 10*3/uL (ref 0.7–4.0)
MCH: 32.3 pg (ref 26.0–34.0)
MCHC: 32.5 g/dL (ref 30.0–36.0)
MCV: 99.6 fL (ref 80.0–100.0)
Monocytes Absolute: 0.4 10*3/uL (ref 0.1–1.0)
Monocytes Relative: 8 %
Neutro Abs: 4.1 10*3/uL (ref 1.7–7.7)
Neutrophils Relative %: 76 %
Platelets: 216 10*3/uL (ref 150–400)
RBC: 4.7 MIL/uL (ref 4.22–5.81)
RDW: 15 % (ref 11.5–15.5)
WBC: 5.4 10*3/uL (ref 4.0–10.5)
nRBC: 0 % (ref 0.0–0.2)

## 2023-08-15 LAB — MAGNESIUM: Magnesium: 2.1 mg/dL (ref 1.7–2.4)

## 2023-08-16 ENCOUNTER — Inpatient Hospital Stay (HOSPITAL_BASED_OUTPATIENT_CLINIC_OR_DEPARTMENT_OTHER): Payer: Medicare Other | Admitting: Hematology

## 2023-08-16 ENCOUNTER — Inpatient Hospital Stay: Payer: Medicare Other

## 2023-08-16 VITALS — BP 165/103 | HR 61 | Resp 16 | Wt 142.6 lb

## 2023-08-16 DIAGNOSIS — C61 Malignant neoplasm of prostate: Secondary | ICD-10-CM

## 2023-08-16 LAB — PSA: Prostatic Specific Antigen: 2.94 ng/mL (ref 0.00–4.00)

## 2023-08-16 NOTE — Patient Instructions (Signed)
 [facility] at [facility] Discharge Instructions   You were seen and examined today by Dr. Rogers.  He reviewed the results of your lab work which are normal/stable.   You may proceed with your Pluvicto  treatment next week.   Return as scheduled.    Thank you for choosing [facility] at [facility] to provide your oncology and hematology care.  To afford each patient quality time with our provider, please arrive at least 15 minutes before your scheduled appointment time.   If you have a lab appointment with the Cancer Center please come in thru the Main Entrance and check in at the main information desk.  You need to re-schedule your appointment should you arrive 10 or more minutes late.  We strive to give you quality time with our providers, and arriving late affects you and other patients whose appointments are after yours.  Also, if you no show three or more times for appointments you may be dismissed from the clinic at the providers discretion.     Again, thank you for choosing [facility].  Our hope is that these requests will decrease the amount of time that you wait before being seen by our physicians.       _____________________________________________________________  Should you have questions after your visit to [facility], please contact our office at [phone] and follow the prompts.  Our office hours are 8:00 a.m. and 4:30 p.m. Monday - Friday.  Please note that voicemails left after 4:00 p.m. may not be returned until the following business day.  We are closed weekends and major holidays.  You do have access to a nurse 24-7, just call the main number to the clinic [phone] and do not press any options, hold on the line and a nurse will answer the phone.    For prescription refill requests, have your pharmacy contact our office and allow 72 hours.    Due to Covid, you will need to wear a mask  upon entering the hospital. If you do not have a mask, a mask will be given to you at the Main Entrance upon arrival. For doctor visits, patients may have 1 support person age 39 or older with them. For treatment visits, patients can not have anyone with them due to social distancing guidelines and our immunocompromised population.

## 2023-08-16 NOTE — Progress Notes (Signed)
 [facility] [address] [place] [address]    Clinic Day:  08/16/23    Referring physician: Rogers Hai, MD  Patient Care Team: Patient, No Pcp Per as PCP - General (General Practice) Rogers Hai, MD as Consulting Physician (Oncology) Wilson, Diane G, RN as Oncology Nurse Navigator (Oncology)   ASSESSMENT & PLAN:   Assessment: 1.  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 -External beam radiation with 2 years of Lupron  from 05/27/2013 through 07/28/2013.  Received Lupron  until 09/28/2014. -Rising PSA levels in December 2018, of 2.8.  Local recurrence was confirmed with biopsy. -Bone scan on 04/12/2018 did not show any evidence of metastatic disease. -He was evaluated by Dr. Zachary at [facility].  Bone scan and CT scan at [facility] on 07/02/2018 did not show any evidence of metastatic disease.  Left para-aortic lymph node measures 8 mm.  Lupron  started for nonmetastatic HSPC in November 2019 for rapid PSA doubling time of less than 3 months.  He had difficulty tolerating Lupron  with irritability, low energy and fatigue. -PSA 5.7 (07/08/2019), PSA 11.1 (11/26/2019) with testosterone  18. -Fluciclovine PET CT scan on 12/23/2019 showed T11 meta stasis.  Asymmetric hypermetabolism within the prostate suspicious for residual disease.  Mild enlargement of 2 adjacent lower left para-aortic nodes, largest 8 mm with SUV of 3.4.  These nodes measured maximally 5 mm on PET scan on 02/25/2018. -Abiraterone  500 mg daily and prednisone  from 01/03/2020 through 02/16/2020, discontinued secondary to elevated LFTs. -We will consider checking germline and somatic mutation testing. -Lupron  45 mg on 02/17/2020 at Dr. Hallie office. -Enzalutamide  from 03/10/2020 through 10/17/2022 with progression. - Guardant360: MSI high not detected. NOTCH1 mutation. - Recommended cabazitaxel  to minimize  incidence of neuropathy as he is a technical sales engineer and plays guitar. - Cycle 1 cabazitaxel  on 11/21/2022, cycle 8 on 04/17/2023, held due to poor tolerance including fatigue, mood changes. - Pluvicto  cycle 1 on 05/31/2023, cycle 2 on 07/12/2023   2.  Heterozygosity for MUTYH mutation: - Cologuard test was refused previously.    Plan: 1.  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  He received cycle 2 on 07/12/2023. - He reported tiredness for 4 days after each infusion.  He does not report any dry mouth.  He reports improvement in tiredness compared to chemotherapy. - Reviewed labs today: Normal LFTs and creatinine.  CBC normal.  PSA has improved to 2.94 from 5.75. - He will proceed with his Pluvicto  #3 on 08/23/2023.  RTC 6 weeks for follow-up. - He will receive Eligard  next week.   2.  Bone metastasis: - Denosumab was discussed and refused previously.  Continue calcium and vitamin D supplements.   3.  Hypertension: - Blood pressure is slightly high at 165/89 today.  Continue losartan  25 mg daily.  Recheck blood pressure next week prior to treatment.  If it continues to be high, will increase to 50 mg daily.   4.  Cancer-related fatigue: - Use Ritalin  tablet as needed.  He has not required any since the start of Pluvicto .  5.  Nausea: - He has occasional nausea since Pluvicto  was started.  Continue Zofran  4 mg every 8 hours as needed.    No orders of the defined types were placed in this encounter.     Nathan Smith,acting as a neurosurgeon for Sony Schlarb, MD.,have  documented all relevant documentation on the behalf of Nathan Stands, MD,as directed by  Nathan Stands, MD while in the presence of Nathan Stands, MD.  I, Nathan Stands MD, have reviewed the above documentation for accuracy and completeness, and I agree with the above.     Nathan Stands, MD   1/9/20254:33 PM  CHIEF COMPLAINT:    Diagnosis: castration resistant prostate cancer    Cancer Staging  Prostate cancer [facility]) Staging form: Prostate, AJCC 7th Edition - Clinical stage from 12/26/2019: Stage IV (yTX, N1, M1b, PSA: 10 to 19, Gleason 8-10) - Signed by Smith Alean, MD on 12/26/2019    Prior Therapy: 1. XRT 05/27/2013 - 07/28/2013  2. Lupron  03/2013 - 09/28/14 3. Abiraterone  12/24/19 - 02/16/20 4. enzalutamide  80 mg QD 03/10/20 - 10/17/22  Current Therapy:  cabazitaxel ; Lupron  every 6 months    HISTORY OF PRESENT ILLNESS:   Oncology History  Prostate cancer (HCC)  09/24/2012 Initial Diagnosis   Prostate cancer (HCC)   12/26/2019 Cancer Staging   Staging form: Prostate, AJCC 7th Edition - Clinical stage from 12/26/2019: Stage IV (yTX, N1, M1b, PSA: 10 to 19, Gleason 8-10) - Signed by Smith Alean, MD on 12/26/2019   08/24/2020 Genetic Testing   Positive genetic testing:  A single, 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 Testing was completed through the Common Hereditary Cancers panel and Prostate Cancer HRR panel offered by [facility] laboratories. The report date is 08/24/2020.   The Common Hereditary Cancers Panel offered by [facility] includes sequencing and/or deletion duplication testing of the following 47 genes: APC, ATM, AXIN2, BARD1, BMPR1A, BRCA1, BRCA2, BRIP1, CDH1, CDK4, CDKN2A (p14ARF), CDKN2A (p16INK4a), CHEK2, CTNNA1, DICER1, EPCAM (Deletion/duplication testing only), GREM1 (promoter region deletion/duplication testing only), KIT, MEN1, MLH1, MSH2, MSH3, MSH6, MUTYH, NBN, NF1, NTHL1, PALB2, PDGFRA, PMS2, POLD1, POLE, PTEN, RAD50, RAD51C, RAD51D, SDHB, SDHC, SDHD, SMAD4, SMARCA4. STK11, TP53, TSC1, TSC2, and VHL.  The following genes were evaluated for sequence changes only: SDHA and HOXB13 c.251G>A variant only. The Prostate Cancer HRR Panel offered by [facility] includes sequencing and/or deletion/duplication analysis of the following 10 genes: ATM, BARD1,  BRCA1, BRCA2, BRIP1, CHEK2, FANCL, PALB2, RAD51C, and RAD51D.   11/21/2022 -  Chemotherapy   Patient is on Treatment Plan : PROSTATE Cabazitaxel  (20) D1 + Prednisone  D1-21 q21d        INTERVAL HISTORY:   Nathan Smith is a 73 y.o. male presenting to clinic today for follow up of castration resistant prostate cancer. He was last seen by me on 05/24/23.  Today, he states that he is doing well overall. His appetite level is at 100%. His energy level is at 75%.  He is tolerating Pluvicto  well. He does have occasional nausea that is well controlled with Zofran  ad tiredness for 4 days after treatment. He denies any dry mouth or new onset pains. He reports an increase in energy levels since discontinuing chemotherapy. He attributes any tiredness or depression to Lupron  injection. He is not taking Ritalin . He is taking losartan  as prescribed, though his blood pressure today is slightly higher than normal.   PAST MEDICAL HISTORY:   Past Medical History: Past Medical History:  Diagnosis Date   Anxiety    Bilateral hydronephrosis    Bladder incontinence    night time,  past 2 months   BPH (benign prostatic hypertrophy) with urinary obstruction    Depression    ED (erectile dysfunction)    H/O ascites    trace   Hypertension  Port-A-Cath in place 11/13/2022   Prostate cancer (HCC) 09/24/2012   gleason 4+3=7., & 4+4=8,PSA=67.30, volume=33.7cc    Surgical History: Past Surgical History:  Procedure Laterality Date   IR IMAGING GUIDED PORT INSERTION  11/16/2022   PROSTATE BIOPSY  09/24/12   Adenocarcinoma   PROSTATE BIOPSY N/A 12/20/2017   Procedure: BIOPSY TRANSRECTAL ULTRASONIC PROSTATE (TUBP);  Surgeon: Matilda Senior, MD;  Location: [facility];  Service: Urology;  Laterality: N/A;   TONSILLECTOMY      Social History: Social History   Socioeconomic History   Marital status: Single    Spouse name: Not on file   Number of children: 0   Years of education: Not on file   Highest education  level: Not on file  Occupational History   Occupation: [name]    Employer: [employer]  Tobacco Use   Smoking status: Never   Smokeless tobacco: Never  Vaping Use   Vaping status: Never Used  Substance and Sexual Activity   Alcohol use: Yes    Alcohol/week: 2.0 standard drinks of alcohol    Types: 2 Cans of beer per week    Comment: 3 per day, beer/wine , hx etoh abuse years ago   Drug use: No    Comment: hx   Sexual activity: Yes    Birth control/protection: Condom  Other Topics Concern   Not on file  Social History Narrative   Not on file   Social Drivers of Health   Financial Resource Strain: Low Risk  (12/08/2019)   Overall Financial Resource Strain (CARDIA)    Difficulty of Paying Living Expenses: Not very hard  Food Insecurity: No Food Insecurity (12/08/2019)   Hunger Vital Sign    Worried About Running Out of Food in the Last Year: Never true    Ran Out of Food in the Last Year: Never true  Transportation Needs: No Transportation Needs (12/08/2019)   PRAPARE - Administrator, Civil Service (Medical): No    Lack of Transportation (Non-Medical): No  Physical Activity: Sufficiently Active (12/08/2019)   Exercise Vital Sign    Days of Exercise per Week: 6 days    Minutes of Exercise per Session: 60 min  Stress: Stress Concern Present (12/08/2019)   [employer] of Occupational Health - Occupational Stress Questionnaire    Feeling of Stress : To some extent  Social Connections: Unknown (12/08/2019)   Social Connection and Isolation Panel [NHANES]    Frequency of Communication with Friends and Family: Twice a week    Frequency of Social Gatherings with Friends and Family: Once a week    Attends Religious Services: More than 4 times per year    Active Member of [employer] or Organizations: No    Attends Engineer, Structural: More than 4 times per year    Marital Status: Not on file  Intimate Partner Violence: Not At Risk (12/08/2019)    Humiliation, Afraid, Rape, and Kick questionnaire    Fear of Current or Ex-Partner: No    Emotionally Abused: No    Physically Abused: No    Sexually Abused: No    Family History: Family History  Problem Relation Age of Onset   Depression Mother    Dementia Mother    Anxiety disorder Mother    Prostate cancer Father        unconfirmed diagnosis    Current Medications:  Current Outpatient Medications:    Ascorbic Acid (VITAMIN C) 1000 MG tablet, Take 1,000 mg by  mouth daily., Disp: , Rfl:    b complex vitamins capsule, Take 1 capsule by mouth daily., Disp: , Rfl:    Bacillus Coagulans-Inulin (PROBIOTIC) 1-250 BILLION-MG CAPS, Take by mouth daily. , Disp: , Rfl:    Cabazitaxel  (JEVTANA  IV), Inject into the vein every 21 ( twenty-one) days., Disp: , Rfl:    Cholecalciferol 125 MCG (5000 UT) capsule, Take 5,000 Units by mouth daily. , Disp: , Rfl:    Ginseng 100 MG CAPS, Take by mouth daily. , Disp: , Rfl:    L-ARGININE PO, Take 1,000 mg by mouth daily. , Disp: , Rfl:    lidocaine -prilocaine  (EMLA ) cream, Apply a small amount to port a cath site and cover with plastic wrap one hour prior to infusion appointments, Disp: 30 g, Rfl: 3   losartan  (COZAAR ) 25 MG tablet, TAKE 1 TABLET(25 MG) BY MOUTH DAILY, Disp: 90 tablet, Rfl: 3   melatonin 1 MG TABS tablet, Take 2 mg by mouth at bedtime. , Disp: , Rfl:    methylphenidate  (RITALIN ) 5 MG tablet, Take 0.5 tablets (2.5 mg total) by mouth daily as needed., Disp: 10 tablet, Rfl: 0   ondansetron  (ZOFRAN ) 4 MG tablet, Take 1 tablet (4 mg total) by mouth every 8 (eight) hours as needed for nausea or vomiting., Disp: 60 tablet, Rfl: 2   Probiotic Product (ALIGN) 4 MG CAPS, Take 1 capsule by mouth daily. , Disp: , Rfl:    prochlorperazine  (COMPAZINE ) 10 MG tablet, Take 1 tablet (10 mg total) by mouth every 6 (six) hours as needed for nausea or vomiting., Disp: 60 tablet, Rfl: 1   tamsulosin  (FLOMAX ) 0.4 MG CAPS capsule, TAKE 1 CAPSULE(0.4 MG) BY  MOUTH DAILY, Disp: 90 capsule, Rfl: 3   zinc gluconate 50 MG tablet, Take 50 mg by mouth daily., Disp: , Rfl:  No current facility-administered medications for this visit.  Facility-Administered Medications Ordered in Other Visits:    fulvestrant  (FASLODEX ) 250 MG/5ML injection, , , ,    Allergies: No Known Allergies  REVIEW OF SYSTEMS:   Review of Systems  Constitutional:  Negative for chills, fatigue and fever.  HENT:   Negative for lump/mass, mouth sores, nosebleeds, sore throat and trouble swallowing.   Eyes:  Negative for eye problems.  Respiratory:  Negative for cough and shortness of breath.   Cardiovascular:  Negative for chest pain, leg swelling and palpitations.  Gastrointestinal:  Positive for diarrhea and nausea. Negative for abdominal pain, constipation and vomiting.  Genitourinary:  Negative for bladder incontinence, difficulty urinating, dysuria, frequency, hematuria and nocturia.   Musculoskeletal:  Negative for arthralgias, back pain, flank pain, myalgias and neck pain.  Skin:  Negative for itching and rash.  Neurological:  Negative for dizziness, headaches and numbness.  Hematological:  Does not bruise/bleed easily.  Psychiatric/Behavioral:  Negative for depression, sleep disturbance and suicidal ideas. The patient is not nervous/anxious.   All other systems reviewed and are negative.    VITALS:   Blood pressure (!) 165/103, pulse 61, resp. rate 16, weight 142 lb 10.2 oz (64.7 kg), SpO2 98%.  Wt Readings from Last 3 Encounters:  08/16/23 142 lb 10.2 oz (64.7 kg)  05/24/23 145 lb (65.8 kg)  04/17/23 145 lb 14.4 oz (66.2 kg)    Body mass index is 19.35 kg/m.  Performance status (ECOG): 1 - Symptomatic but completely ambulatory  PHYSICAL EXAM:   Physical Exam Vitals and nursing note reviewed. Exam conducted with a chaperone present.  Constitutional:      Appearance:  Normal appearance.  Cardiovascular:     Rate and Rhythm: Normal rate and regular rhythm.      Pulses: Normal pulses.     Heart sounds: Normal heart sounds.  Pulmonary:     Effort: Pulmonary effort is normal.     Breath sounds: Normal breath sounds.  Abdominal:     Palpations: Abdomen is soft. There is no hepatomegaly, splenomegaly or mass.     Tenderness: There is no abdominal tenderness.  Musculoskeletal:     Right lower leg: No edema.     Left lower leg: No edema.  Lymphadenopathy:     Cervical: No cervical adenopathy.     Right cervical: No superficial, deep or posterior cervical adenopathy.    Left cervical: No superficial, deep or posterior cervical adenopathy.     Upper Body:     Right upper body: No supraclavicular or axillary adenopathy.     Left upper body: No supraclavicular or axillary adenopathy.  Neurological:     General: No focal deficit present.     Mental Status: He is alert and oriented to person, place, and time.  Psychiatric:        Mood and Affect: Mood normal.        Behavior: Behavior normal.     LABS:      Latest Ref Rng & Units 08/15/2023    3:05 PM 07/03/2023    2:59 PM 05/23/2023    3:45 PM  CBC  WBC 4.0 - 10.5 K/uL 5.4  5.5  4.8   Hemoglobin 13.0 - 17.0 g/dL 84.7  85.5  86.4   Hematocrit 39.0 - 52.0 % 46.8  44.5  42.0   Platelets 150 - 400 K/uL 216  233  256       Latest Ref Rng & Units 08/15/2023    3:05 PM 07/03/2023    2:59 PM 05/23/2023    3:45 PM  CMP  Glucose 70 - 99 mg/dL 865  95  871   BUN 8 - 23 mg/dL 14  17  14    Creatinine 0.61 - 1.24 mg/dL 8.91  8.92  9.00   Sodium 135 - 145 mmol/L 134  136  137   Potassium 3.5 - 5.1 mmol/L 4.0  4.0  4.1   Chloride 98 - 111 mmol/L 97  102  102   CO2 22 - 32 mmol/L 29  27  26    Calcium 8.9 - 10.3 mg/dL 9.6  9.2  9.4   Total Protein 6.5 - 8.1 g/dL 7.2  7.2  7.0   Total Bilirubin 0.0 - 1.2 mg/dL 0.7  0.6  0.8   Alkaline Phos 38 - 126 U/L 60  53  51   AST 15 - 41 U/L 21  19  21    ALT 0 - 44 U/L 15  15  16       No results found for: CEA1, CEA / No results found for: CEA1,  CEA Lab Results  Component Value Date   PSA1 0.3 08/30/2021   No results found for: CAN199 No results found for: CAN125  No results found for: TOTALPROTELP, ALBUMINELP, A1GS, A2GS, BETS, BETA2SER, GAMS, MSPIKE, SPEI No results found for: TIBC, FERRITIN, IRONPCTSAT Lab Results  Component Value Date   LDH 129 03/22/2020     STUDIES:   No results found.

## 2023-08-17 ENCOUNTER — Encounter: Payer: Self-pay | Admitting: Behavioral Health

## 2023-08-17 ENCOUNTER — Ambulatory Visit: Payer: Medicare Other | Admitting: Behavioral Health

## 2023-08-17 DIAGNOSIS — F411 Generalized anxiety disorder: Secondary | ICD-10-CM

## 2023-08-17 DIAGNOSIS — F331 Major depressive disorder, recurrent, moderate: Secondary | ICD-10-CM

## 2023-08-17 DIAGNOSIS — F4010 Social phobia, unspecified: Secondary | ICD-10-CM | POA: Diagnosis not present

## 2023-08-17 NOTE — Progress Notes (Signed)
 [facility] Counselor/Therapist Progress Note  Patient ID: Nathan Smith, MRN: [id number],    Date: 08/17/2023  Time Spent: 58 minutes, from 3:00 PM to 3:58 PM.  This session was held via video teletherapy. The patient consented to the video teletherapy and was located in his home during this session. He is aware it is the responsibility of the patient to secure confidentiality on his end of the session. The provider was in and outpatient therapy office for the duration of this session.   Treatment Type: Individual Therapy  Reported Symptoms: anxiety  Mental Status Exam: Appearance:  Well Groomed     Behavior: Appropriate  Motor: Normal  Speech/Language:  Clear and Coherent  Affect: Appropriate  Mood: anxious  Thought process: normal  Thought content:   WNL  Sensory/Perceptual disturbances:   WNL  Orientation: oriented to person, place, time/date, situation, day of week, and month of year  Attention: Good  Concentration: Good  Memory: WNL  Fund of knowledge:  Good  Insight:   Good  Judgment:  Good  Impulse Control: Good   Risk Assessment: Danger to Self:  No Self-injurious Behavior: No Danger to Others: No Duty to Warn:no Physical Aggression / Violence:No  Access to Firearms a concern: No  Gang Involvement:No   Subjective: This session started being video but there was glitchy connection possibly due to the inclement weather and so we had to switch to a phone session at about 3:15.  There have been some positives.  He has now taken 2 rounds of the new treatment and has another one coming up next week.  There have been minimal side effects fatigue maybe being one of them but that could also be related to the hormone therapy.  He reports that on his last visit with his doctor the cancer markers had gone down.  He sounded brighter and stronger.  The downside is because of the amount of radiation he cannot be around people for 4 days after the treatment and has to  be scanned with a Geiger counter before leaving the treatment area to make sure levels are at a certain point.  Other than that has been good because he says he does not mind being at home.  He does have to miss church on occasion because of that for day isolation.  Which she does not like but is thankful there is something that is making a positive difference.  He can take a total of 6 treatments.  We looked more at the patient's history including with his parents.  He said his mother had significant mental health issues and his father had substance abuse issues.  He did not feel that he did a very good job of parenting and he knows that is why he left home as soon as he could.  He started off at least [place] and transferred to [facility] where he said he got into drugs which he knew now that he used as an escape or as a way of coping but was not healthy.  He said with his parents he never really felt like he was cared for a loved and at times he walks around feeling like he is guarded in relationships because he feels that he cannot be loved although his faith has made him grow in a more positive direction along those lines.  We talked about the importance of focusing on his faith.  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  He has come to a better place in terms of what he describes as a detente with the leader of his church but still enjoys being the geneticist, molecular.  I encouraged him to stay focused on his music and we talked about how positive thoughts and attitude make a big difference in healing.  He does contract for safety having no thoughts of hurting himself or anyone else.  Interventions: Cognitive Behavioral Therapy  Diagnosis: Generalized anxiety disorder, social anxiety disorder  Plan: I will meet with the patient every 3 to 4 weeks preferably in office. Treatment plan: We will use cognitive behavioral  therapy as well as elements of dialectical behavior therapy and person centered therapy to help reduce the patient's anxiety by at least 50% by July 07, 2023 as well as support him as he goes to intensive chemotherapy for cancer.  Goals will be to improve his ability to manage anxiety symptoms better, resolve issues contributing to his anxiety including his past and treatment that he is going through as well as helping him manage thoughts and worrisome thinking contributing to feelings of anxiety.  We will facilitate problem solution skills to help him manage anxiety and stress, teach coping skills for managing anxiety and stress as well as using cognitive behavioral therapy to identify and change anxiety provoking thoughts and behavior patterns.  We will also teach DBT distress tolerance and mindfulness skills to help him learn better anxiety management.  Progress: 30% agreed to continue with goals as stated with a new target date of Jan 04, 2024. Lorrene CHRISTELLA Hasten, [facility]                 Lorrene CHRISTELLA Hasten, [facility]               Lorrene CHRISTELLA Hasten, [facility]               Lorrene CHRISTELLA Hasten, [facility]               Lorrene CHRISTELLA Hasten, [facility]               Lorrene CHRISTELLA Hasten, [facility]               Lorrene CHRISTELLA Hasten, [facility]               Lorrene CHRISTELLA Hasten, [facility]

## 2023-08-21 ENCOUNTER — Inpatient Hospital Stay: Payer: Medicare Other

## 2023-08-21 VITALS — BP 154/93 | HR 72 | Temp 97.5°F | Resp 16 | Wt 141.6 lb

## 2023-08-21 DIAGNOSIS — C61 Malignant neoplasm of prostate: Secondary | ICD-10-CM | POA: Diagnosis not present

## 2023-08-21 MED ORDER — LEUPROLIDE ACETATE (6 MONTH) 45 MG ~~LOC~~ KIT
45.0000 mg | PACK | Freq: Once | SUBCUTANEOUS | Status: AC
  Administered 2023-08-21: 45 mg via SUBCUTANEOUS
  Filled 2023-08-21: qty 45

## 2023-08-21 NOTE — Progress Notes (Signed)
 Patient tolerated injection with no complaints voiced.  Site clean and dry with no bruising or swelling noted at site.  See MAR for details.  Band aid applied.  Patient stable during and after injection.  Vss with discharge and left in satisfactory condition with no s/s of distress noted.

## 2023-08-21 NOTE — Patient Instructions (Signed)

## 2023-08-23 ENCOUNTER — Ambulatory Visit (HOSPITAL_COMMUNITY)
Admission: RE | Admit: 2023-08-23 | Discharge: 2023-08-23 | Disposition: A | Discharge status: Home / Self Care | Payer: Medicare Other | Source: Ambulatory Visit | Attending: Hematology | Admitting: Hematology

## 2023-08-23 ENCOUNTER — Ambulatory Visit: Payer: Medicare Other

## 2023-08-23 DIAGNOSIS — C61 Malignant neoplasm of prostate: Secondary | ICD-10-CM | POA: Diagnosis present

## 2023-08-23 MED ORDER — SODIUM CHLORIDE 0.9 % IV BOLUS: 1000.0000 mL | Freq: Once | INTRAVENOUS | Status: DC

## 2023-08-23 MED ORDER — LUTETIUM LU 177 VIPIVOTIDE TET 1000 MBQ/ML IV SOLN
199.1700 | Freq: Once | INTRAVENOUS | Status: AC
  Administered 2023-08-23: 199.17 via INTRAVENOUS

## 2023-08-23 MED ORDER — SODIUM CHLORIDE 0.9 % IV SOLN: INTRAVENOUS | Status: AC

## 2023-08-23 NOTE — Progress Notes (Signed)
CLINICAL DATA: 73 year old male with castrate resistant metastatic prostate cancer. Spine metastasis. Mildly increased PSA. Trial of taxane chemotherapy.]  EXAM: NUCLEAR MEDICINE PLUVICTO INJECTION  TECHNIQUE: Infusion: The nuclear medicine technologist and I personally verified the dose activity to be delivered as specified in the written directive, and verified the patient identification via 2 separate methods.  Initial flush of the intravenous catheter was performed was sterile saline. The dose syringe was connected to the catheter and the Lu-177 Pluvicto administered over a 1 to 10 min infusion. Single 10 cc  lushes with normal saline follow the dose. No complications were noted. The entire IV tubing, venocatheter, stopcock and syringes was removed in total, placed in a disposal bag and sent for assay of the residual activity, which will be reported at a later time in our EMR by the physics staff. Pressure was applied to the venipuncture site, and a compression bandage placed. Patient monitored for 1 hour following infusion.    Radiation Safety personnel were present to perform the discharge survey, as detailed on their documentation. After a short period of observation, the patient had his IV removed.  RADIOPHARMACEUTICALS: [199.2 Microcuries Lu-177 PLUVICTO  FINDINGS: Current Infusion: [3]  Planned Infusions: 6    Patient presented to nuclear medicine for treatment. The patient's most recent blood counts were reviewed and remains a good candidate to proceed with Lu-177 Pluvicto.     No myelosuppression.  No renal toxicity.  PSA declined to 2.94.     The patient was situated in an infusion suite with a contact barrier placed under the arm. Intravenous access was established, using sterile technique, and a normal saline infusion from a syringe was started.     Micro-dosimetry: The prescribed radiation activity was assayed and confirmed to be within specified  tolerance.  IMPRESSION: Current Infusion: 3   ]  Planned Infusions: 6    [The patient tolerated the infusion well. The patient will return in 6 weeksfor ongoing care.]

## 2023-08-23 NOTE — Written Directive (Addendum)
  PLUVICTO  THERAPY   RADIOPHARMACEUTICAL: Lutetium 177 vipivotide tetraxetan (Pluvicto)     PRESCRIBED DOSE FOR ADMINISTRATION:  200 mCi   ROUTE OFADMINISTRATION:  IV   DIAGNOSIS:  prostate cancer   REFERRING PHYSICIAN: Doreatha Massed   TREATMENT #: 3   ADDITIONAL PHYSICIAN COMMENTS/NOTES:   AUTHORIZED USER SIGNATURE & TIME STAMP: Patriciaann Clan, MD   08/27/23    2:01 PM   .wr

## 2023-09-13 ENCOUNTER — Encounter: Payer: Self-pay | Admitting: Behavioral Health

## 2023-09-13 ENCOUNTER — Ambulatory Visit: Payer: Medicare Other | Admitting: Behavioral Health

## 2023-09-13 DIAGNOSIS — F331 Major depressive disorder, recurrent, moderate: Secondary | ICD-10-CM

## 2023-09-13 DIAGNOSIS — F411 Generalized anxiety disorder: Secondary | ICD-10-CM

## 2023-09-13 DIAGNOSIS — F4010 Social phobia, unspecified: Secondary | ICD-10-CM | POA: Diagnosis not present

## 2023-09-13 NOTE — Progress Notes (Signed)
 [facility] Counselor/Therapist Progress Note  Patient ID: Manjinder Breau, MRN: [id number],    Date: 09/13/2023  Time Spent: 55 minutes, 1 PM until 1:55 PM.  There were audio difficulties with care agility so we switched to strictly phone session at 1:05 PM and that lasted for 50 minutes.  This session was held via video teletherapy. The patient consented to the video teletherapy and was located in his home during this session. He is aware it is the responsibility of the patient to secure confidentiality on his end of the session. The provider was in and outpatient therapy office for the duration of this session.   Treatment Type: Individual Therapy  Reported Symptoms: anxiety  Mental Status Exam: Appearance:  Well Groomed     Behavior: Appropriate  Motor: Normal  Speech/Language:  Clear and Coherent  Affect: Appropriate  Mood: anxious  Thought process: normal  Thought content:   WNL  Sensory/Perceptual disturbances:   WNL  Orientation: oriented to person, place, time/date, situation, day of week, and month of year  Attention: Good  Concentration: Good  Memory: WNL  Fund of knowledge:  Good  Insight:   Good  Judgment:  Good  Impulse Control: Good   Risk Assessment: Danger to Self:  No Self-injurious Behavior: No Danger to Others: No Duty to Warn:no Physical Aggression / Violence:No  Access to Firearms a concern: No  Gang Involvement:No   Subjective: The last couple of weeks have been difficult for the patient in terms of mood stability.  He had a hormone treatment as well as her radiation treatment 2 days within each other and said he knew that would be difficult for him physically but he went into what he described as somewhat of a funk.  He did not have a lot of energy to do much for a while and with the radiation cannot be around anybody for 4 days.  He said he started thinking about if this was the time of life he was going to have to have a quality of life he  was not sure he was worth it.  He says he is safe and does contract for safety but at times it feels overwhelming to him.  Church and especially the police team are safe haven for him and he is not able to always put the energy into it that he wants to or if he does he is exhausted at the end of it.  There are also some conflict with the pastor which has been fairly stable recently but started off on the wrong foot.  We explored the history of that because the patient felt that things were not handled very well on the pastor's part but he did what he could to make it better and she did not make an effort to apologize.  Talked about accepting that not in terms of it being okay but in terms of him moving forward and doing a job that he loves to do.  We also talked about other things that he might be able to do to get out of the house when he feels well enough to do so.  He has written a song and has a friend with a recording studio in another town close by and is going to let him come record the song.  He is looking forward to feeling well enough to do that. He does contract for safety having no thoughts of hurting himself or anyone else.  Interventions: Cognitive Behavioral Therapy  Diagnosis: Generalized  anxiety disorder, social anxiety disorder  Plan: I will meet with the patient every 3 to 4 weeks preferably in office. Treatment plan: We will use cognitive behavioral therapy as well as elements of dialectical behavior therapy and person centered therapy to help reduce the patient's anxiety by at least 50% by July 07, 2023 as well as support him as he goes to intensive chemotherapy for cancer.  Goals will be to improve his ability to manage anxiety symptoms better, resolve issues contributing to his anxiety including his past and treatment that he is going through as well as helping him manage thoughts and worrisome thinking contributing to feelings of anxiety.  We will facilitate problem solution  skills to help him manage anxiety and stress, teach coping skills for managing anxiety and stress as well as using cognitive behavioral therapy to identify and change anxiety provoking thoughts and behavior patterns.  We will also teach DBT distress tolerance and mindfulness skills to help him learn better anxiety management.  Progress: 30% agreed to continue with goals as stated with a new target date of Jan 04, 2024. Lorrene CHRISTELLA Hasten, [facility]                 Lorrene CHRISTELLA Hasten, [facility]               Lorrene CHRISTELLA Hasten, [facility]               Lorrene CHRISTELLA Hasten, [facility]               Lorrene CHRISTELLA Hasten, [facility]               Lorrene CHRISTELLA Hasten, [facility]               Lorrene CHRISTELLA Hasten, [facility]               Lorrene CHRISTELLA Hasten, [facility]               Lorrene CHRISTELLA Hasten, [facility]

## 2023-09-25 ENCOUNTER — Other Ambulatory Visit: Payer: Self-pay

## 2023-09-25 ENCOUNTER — Inpatient Hospital Stay: Payer: Medicare Other | Attending: Hematology

## 2023-09-25 DIAGNOSIS — C61 Malignant neoplasm of prostate: Secondary | ICD-10-CM

## 2023-09-25 DIAGNOSIS — Z79899 Other long term (current) drug therapy: Secondary | ICD-10-CM | POA: Insufficient documentation

## 2023-09-25 DIAGNOSIS — Z1509 Genetic susceptibility to other malignant neoplasm: Secondary | ICD-10-CM | POA: Diagnosis not present

## 2023-09-25 DIAGNOSIS — Z8042 Family history of malignant neoplasm of prostate: Secondary | ICD-10-CM | POA: Insufficient documentation

## 2023-09-25 DIAGNOSIS — I10 Essential (primary) hypertension: Secondary | ICD-10-CM | POA: Diagnosis not present

## 2023-09-25 DIAGNOSIS — C7951 Secondary malignant neoplasm of bone: Secondary | ICD-10-CM | POA: Insufficient documentation

## 2023-09-25 DIAGNOSIS — C772 Secondary and unspecified malignant neoplasm of intra-abdominal lymph nodes: Secondary | ICD-10-CM | POA: Insufficient documentation

## 2023-09-25 DIAGNOSIS — Z923 Personal history of irradiation: Secondary | ICD-10-CM | POA: Diagnosis not present

## 2023-09-25 DIAGNOSIS — R530 Neoplastic (malignant) related fatigue: Secondary | ICD-10-CM | POA: Diagnosis not present

## 2023-09-25 DIAGNOSIS — R110 Nausea: Secondary | ICD-10-CM | POA: Diagnosis not present

## 2023-09-25 LAB — COMPREHENSIVE METABOLIC PANEL
ALT: 11 U/L (ref 0–44)
AST: 17 U/L (ref 15–41)
Albumin: 3.7 g/dL (ref 3.5–5.0)
Alkaline Phosphatase: 72 U/L (ref 38–126)
Anion gap: 11 (ref 5–15)
BUN: 11 mg/dL (ref 8–23)
CO2: 28 mmol/L (ref 22–32)
Calcium: 9.7 mg/dL (ref 8.9–10.3)
Chloride: 101 mmol/L (ref 98–111)
Creatinine, Ser: 0.9 mg/dL (ref 0.61–1.24)
GFR, Estimated: >60 mL/min (ref >60)
Glucose, Bld: 106 mg/dL — ABNORMAL HIGH (ref 70–99)
Potassium: 4.1 mmol/L (ref 3.5–5.1)
Sodium: 140 mmol/L (ref 135–145)
Total Bilirubin: 1 mg/dL (ref 0.0–1.2)
Total Protein: 7.3 g/dL (ref 6.5–8.1)

## 2023-09-25 LAB — CBC WITH DIFFERENTIAL/PLATELET
Abs Immature Granulocytes: 0.01 10*3/uL (ref 0.00–0.07)
Basophils Absolute: 0 10*3/uL (ref 0.0–0.1)
Basophils Relative: 1 %
Eosinophils Absolute: 0.1 10*3/uL (ref 0.0–0.5)
Eosinophils Relative: 2 %
HCT: 48.3 % (ref 39.0–52.0)
Hemoglobin: 15.9 g/dL (ref 13.0–17.0)
Immature Granulocytes: 0 %
Lymphocytes Relative: 10 %
Lymphs Abs: 0.6 10*3/uL — ABNORMAL LOW (ref 0.7–4.0)
MCH: 32.5 pg (ref 26.0–34.0)
MCHC: 32.9 g/dL (ref 30.0–36.0)
MCV: 98.8 fL (ref 80.0–100.0)
Monocytes Absolute: 0.7 10*3/uL (ref 0.1–1.0)
Monocytes Relative: 11 %
Neutro Abs: 4.5 10*3/uL (ref 1.7–7.7)
Neutrophils Relative %: 76 %
Platelets: 264 10*3/uL (ref 150–400)
RBC: 4.89 MIL/uL (ref 4.22–5.81)
RDW: 14.6 % (ref 11.5–15.5)
WBC: 5.8 10*3/uL (ref 4.0–10.5)
nRBC: 0 % (ref 0.0–0.2)

## 2023-09-25 LAB — MAGNESIUM: Magnesium: 2.2 mg/dL (ref 1.7–2.4)

## 2023-09-25 LAB — PSA: Prostatic Specific Antigen: 2.17 ng/mL (ref 0.00–4.00)

## 2023-09-26 ENCOUNTER — Inpatient Hospital Stay: Payer: Medicare Other

## 2023-09-27 ENCOUNTER — Inpatient Hospital Stay (HOSPITAL_BASED_OUTPATIENT_CLINIC_OR_DEPARTMENT_OTHER): Payer: Medicare Other | Admitting: Hematology

## 2023-09-27 ENCOUNTER — Other Ambulatory Visit: Payer: Self-pay

## 2023-09-27 ENCOUNTER — Inpatient Hospital Stay: Payer: Medicare Other | Admitting: Hematology

## 2023-09-27 ENCOUNTER — Encounter: Payer: Self-pay | Admitting: Hematology

## 2023-09-27 DIAGNOSIS — C61 Malignant neoplasm of prostate: Secondary | ICD-10-CM | POA: Diagnosis not present

## 2023-09-27 NOTE — Progress Notes (Signed)
Virtual Visit via Telephone Note  I connected with [name] on 09/27/23 at  2:30 PM EST by telephone and verified that I am speaking with the correct person using two identifiers.  Location: Patient: At home Provider: At office   I discussed the limitations, risks, security and privacy concerns of performing an evaluation and management service by telephone and the availability of in person appointments. I also discussed with the patient that there may be a patient responsible charge related to this service. The patient expressed understanding and agreed to proceed.   History of Present Illness: Nathan Smith is a 74 y.o. 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    Observations/Objective: He received cycle 3 of Pluvicto on 08/23/2023.  He also received Lupron 45 mg on 08/21/2023.  He felt very fatigued as he received both of them in the same week.  He had occasional diarrhea and had to use Imodium and milk of magnesium as needed.  He also reported pain in the upper chest wall when he sleeps on the side.  The pain is usually on the left side when he sleeps on on that side and in the right chest wall when he sleeps on the right side.  Has had occasional nausea but no vomiting.  Assessment and Plan:  1.  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  Lupron 45 mg was given on 08/21/2023. - He reported more fatigue after cycle 3 as he received both medications in the same week. - He also reported musculoskeletal pains on the lateral chest wall on each side when he sleeps on that particular side.  No pain on exertion.  He is also lifting weights and thinks he may have pulled a muscle.  Does not appear cardiac related pain to me as it happens only when he lies down to sleep. - Reviewed labs from 09/25/2023: Normal CBC and LFTs.  Creatinine and  calcium are normal.  PSA continues to improve to 2.17. - He may proceed with cycle 4 of Pluvicto on 10/04/2023 with Dr. Amil Amen.  RTC 6 weeks for follow-up.  2.  Hypertension: - Continue losartan 25 mg daily.  3.  Nausea: - Had occasional nausea since the last dose of Pluvicto.  Continue Zofran 4 mg every 8 hours as needed.  Denied any vomiting.  4.  Bone metastasis: - Denosumab was discussed and refused previously.  Continue calcium and vitamin D supplements.    Follow Up Instructions:    I discussed the assessment and treatment plan with the patient. The patient was provided an opportunity to ask questions and all were answered. The patient agreed with the plan and demonstrated an understanding of the instructions.   The patient was advised to call back or seek an in-person evaluation if the symptoms worsen or if the condition fails to improve as anticipated.  I provided 30 minutes of non-face-to-face time during this encounter.   [name],acting as a Neurosurgeon for Doreatha Massed, MD.,have documented all relevant documentation on the behalf of Doreatha Massed, MD,as directed by  Doreatha Massed, MD while in the presence of Doreatha Massed, MD.  I, Doreatha Massed MD, have reviewed the above documentation for accuracy and completeness, and I agree with the above.    Doreatha Massed, MD

## 2023-09-29 ENCOUNTER — Other Ambulatory Visit: Payer: Self-pay

## 2023-10-03 NOTE — Written Directive (Cosign Needed)
  PLUVICTO  THERAPY   RADIOPHARMACEUTICAL: Lutetium 177 vipivotide tetraxetan (Pluvicto)     PRESCRIBED DOSE FOR ADMINISTRATION:  200 mCi   ROUTE OFADMINISTRATION:  IV   DIAGNOSIS:     REFERRING PHYSICIAN:   TREATMENT #:   ADDITIONAL PHYSICIAN COMMENTS/NOTES:   AUTHORIZED USER SIGNATURE & TIME STAMP:

## 2023-10-04 ENCOUNTER — Ambulatory Visit (HOSPITAL_COMMUNITY)
Admission: RE | Admit: 2023-10-04 | Discharge: 2023-10-04 | Disposition: A | Discharge status: Home / Self Care | Payer: Medicare Other | Source: Ambulatory Visit | Attending: Hematology | Admitting: Hematology

## 2023-10-04 ENCOUNTER — Ambulatory Visit (HOSPITAL_COMMUNITY)
Admission: RE | Admit: 2023-10-04 | Discharge: 2023-10-04 | Disposition: A | Discharge status: Home / Self Care | Payer: Medicare Other | Source: Ambulatory Visit | Attending: Diagnostic Radiology | Admitting: Diagnostic Radiology

## 2023-10-04 DIAGNOSIS — C61 Malignant neoplasm of prostate: Secondary | ICD-10-CM | POA: Diagnosis present

## 2023-10-04 MED ORDER — CARMEX CLASSIC LIP BALM EX OINT
TOPICAL_OINTMENT | CUTANEOUS | Status: DC | PRN
  Filled 2023-10-04: qty 10

## 2023-10-04 MED ORDER — LUTETIUM LU 177 VIPIVOTIDE TET 1000 MBQ/ML IV SOLN: 196.9835 | Freq: Once | INTRAVENOUS | Status: DC

## 2023-10-04 MED ORDER — SODIUM CHLORIDE 0.9 % IV BOLUS: 1000.0000 mL | Freq: Once | INTRAVENOUS | Status: DC

## 2023-10-04 MED ORDER — SODIUM CHLORIDE 0.9 % IV SOLN: INTRAVENOUS | Status: DC

## 2023-10-04 NOTE — Progress Notes (Signed)
 CLINICAL DATA: [Prostate carcinoma.  Castrate resistant prostate carcinoma with metastatic disease to the thoracic spine.]    EXAM: NUCLEAR MEDICINE PLUVICTO INJECTION  TECHNIQUE: Infusion: The nuclear medicine technologist and I personally verified the dose activity to be delivered as specified in the written directive, and verified the patient identification via 2 separate methods.  Initial flush of the intravenous catheter was performed was sterile saline. The dose syringe was connected to the catheter and the Lu-177 Pluvicto administered over a 1 to 10 min infusion. Single 10 cc  lushes with normal saline follow the dose. No complications were noted. The entire IV tubing, venocatheter, stopcock and syringes was removed in total, placed in a disposal bag and sent for assay of the residual activity, which will be reported at a later time in our EMR by the physics staff. Pressure was applied to the venipuncture site, and a compression bandage placed. Patient monitored for 1 hour following infusion.   RADIOPHARMACEUTICALS: [196.9] microcuries Lu-177 PLUVICTO  FINDINGS: Current Infusion: [4]  Planned Infusions: 6 Patient presented to nuclear medicine for treatment.   Patient reports fatigue, anterior chest pain and back pain.  Mucus production and dry mucous membranes.    Patient advised to hydrate well as Lu177 PSMA  therapy can result in dry mouth related to physiologic uptake in the salivary glands.   Patient tender palpation in the chest.  Potential chondrochondritis.  Over the counter NSAIDS recommended.    Patient's lungs clear to auscultation.  Portalable chest x-ray obtained.  Lungs are clear.   The patient's most recent blood counts were reviewed and remains a good candidate to proceed with Lu-177 Pluvicto.  CBC and complete metabolic panel are normal.   PSA continues to decline:  2.17 decreased from 2.94.    The patient was situated in an infusion suite with a contact  barrier placed under the arm. Intravenous access was established, using sterile technique, and a normal saline infusion from a syringe was started.   IMPRESSION: Current Infusion: [4]  Planned Infusions: 6    [The patient tolerated the infusion well. The patient will return in 6 weeks for ongoing care.]

## 2023-10-10 ENCOUNTER — Encounter: Payer: Self-pay | Admitting: Emergency Medicine

## 2023-10-10 ENCOUNTER — Encounter: Payer: Self-pay | Admitting: *Deleted

## 2023-10-10 ENCOUNTER — Ambulatory Visit
Admission: EM | Admit: 2023-10-10 | Discharge: 2023-10-10 | Disposition: A | Discharge status: Home / Self Care | Attending: Nurse Practitioner | Admitting: Nurse Practitioner

## 2023-10-10 DIAGNOSIS — R509 Fever, unspecified: Secondary | ICD-10-CM

## 2023-10-10 LAB — POC COVID19/FLU A&B COMBO
Covid Antigen, POC: NEGATIVE
Influenza A Antigen, POC: NEGATIVE
Influenza B Antigen, POC: NEGATIVE

## 2023-10-10 NOTE — Discharge Instructions (Signed)
 Please go directly to the ER for further evaluation and management of your symptoms

## 2023-10-10 NOTE — ED Provider Notes (Signed)
 [facility]    CSN: [id number] Arrival date & time: 10/10/23  1409      History   Chief Complaint No chief complaint on file.   HPI Nathan Smith is a 73 y.o. male.   Patient presents today with 6-day history of fever.  He reports temperatures have been roughly 101 - 102.8 F.  102.8 fever was last night.  Reports he is also coughing and is coughing up mucus, he is unable to tell me how long he has been coughing for.  He also endorses chest pain has been ongoing for about 6 weeks and reports his Radiologist with the cancer center recently performed a chest x-ray that did not show pneumonia so he does not know why he is having chest pain.  For the past few days, he has been feeling very tired and fatigued.  He is taking multiple naps during the day which is atypical for him.  He denies new runny or stuffy nose, sore throat, or new headache.  No new abdominal pain, nausea/vomiting, diarrhea, change in urinary habits.  Reports he is undergoing treatment for stage IV prostate cancer and has never felt this poorly.    Past Medical History:  Diagnosis Date   Anxiety    Bilateral hydronephrosis    Bladder incontinence    night time,  past 2 months   BPH (benign prostatic hypertrophy) with urinary obstruction    Depression    ED (erectile dysfunction)    H/O ascites    trace   Hypertension    Port-A-Cath in place 11/13/2022   Prostate cancer (HCC) 09/24/2012   gleason 4+3=7., & 4+4=8,PSA=67.30, volume=33.7cc    Patient Active Problem List   Diagnosis Date Noted   Port-A-Cath in place 11/13/2022   Genetic testing 08/24/2020   Malignant neoplasm of prostate (HCC) 11/19/2012   Bladder neck obstruction 11/19/2012   Prostate cancer (HCC) 09/24/2012   HTN (hypertension) 07/23/2012    Past Surgical History:  Procedure Laterality Date   IR IMAGING GUIDED PORT INSERTION  11/16/2022   PROSTATE BIOPSY  09/24/12   Adenocarcinoma   PROSTATE BIOPSY N/A 12/20/2017   Procedure:  BIOPSY TRANSRECTAL ULTRASONIC PROSTATE (TUBP);  Surgeon: Marcine Matar, MD;  Location: [facility];  Service: Urology;  Laterality: N/A;   TONSILLECTOMY         Home Medications    Prior to Admission medications   Medication Sig Start Date End Date Taking? Authorizing Provider  Ascorbic Acid (VITAMIN C) 1000 MG tablet Take 1,000 mg by mouth daily.    [provider]  b complex vitamins capsule Take 1 capsule by mouth daily.    [provider]  Bacillus Coagulans-Inulin (PROBIOTIC) 1-250 BILLION-MG CAPS Take by mouth daily.     [provider]  Cabazitaxel (JEVTANA IV) Inject into the vein every 21 ( twenty-one) days. 11/21/22   [provider]  Cholecalciferol 125 MCG (5000 UT) capsule Take 5,000 Units by mouth daily.     [provider]  Ginseng 100 MG CAPS Take by mouth daily.     [provider]  L-ARGININE PO Take 1,000 mg by mouth daily.     [provider]  lidocaine-prilocaine (EMLA) cream Apply a small amount to port a cath site and cover with plastic wrap one hour prior to infusion appointments 11/13/22   Doreatha Massed, MD  losartan (COZAAR) 25 MG tablet TAKE 1 TABLET(25 MG) BY MOUTH DAILY 02/12/23   Doreatha Massed, MD  melatonin 1 MG  TABS tablet Take 2 mg by mouth at bedtime.     [provider]  methylphenidate (RITALIN) 5 MG tablet Take 0.5 tablets (2.5 mg total) by mouth daily as needed. 07/12/21   Doreatha Massed, MD  ondansetron (ZOFRAN) 4 MG tablet Take 1 tablet (4 mg total) by mouth every 8 (eight) hours as needed for nausea or vomiting. 03/27/23   Doreatha Massed, MD  Probiotic Product (ALIGN) 4 MG CAPS Take 1 capsule by mouth daily.     [provider]  prochlorperazine (COMPAZINE) 10 MG tablet Take 1 tablet (10 mg total) by mouth every 6 (six) hours as needed for nausea or vomiting. 11/13/22   Doreatha Massed, MD  tamsulosin (FLOMAX) 0.4 MG CAPS capsule TAKE 1 CAPSULE(0.4  MG) BY MOUTH DAILY 02/12/23   Doreatha Massed, MD  zinc gluconate 50 MG tablet Take 50 mg by mouth daily.    [provider]    Family History Family History  Problem Relation Age of Onset   Depression Mother    Dementia Mother    Anxiety disorder Mother    Prostate cancer Father        unconfirmed diagnosis    Social History Social History   Tobacco Use   Smoking status: Never   Smokeless tobacco: Never  Vaping Use   Vaping status: Never Used  Substance Use Topics   Alcohol use: Yes    Alcohol/week: 2.0 standard drinks of alcohol    Types: 2 Cans of beer per week    Comment: 3 per day, beer/wine , hx etoh abuse years ago   Drug use: No    Comment: hx     Allergies   Patient has no known allergies.   Review of Systems Review of Systems Per HPI  Physical Exam Triage Vital Signs ED Triage Vitals  Encounter Vitals Group     BP 10/10/23 1420 120/83     Systolic BP Percentile --      Diastolic BP Percentile --      Pulse Rate 10/10/23 1420 (!) 105     Resp 10/10/23 1420 18     Temp 10/10/23 1420 99.3 F (37.4 C)     Temp Source 10/10/23 1420 Oral     SpO2 10/10/23 1420 95 %     Weight --      Height --      Head Circumference --      Peak Flow --      Pain Score 10/10/23 1424 3     Pain Loc --      Pain Education --      Exclude from Growth Chart --    No data found.  Updated Vital Signs BP 120/83 (BP Location: Right Arm)   Pulse (!) 105   Temp 99.3 F (37.4 C) (Oral)   Resp 18   SpO2 95%   Visual Acuity Right Eye Distance:   Left Eye Distance:   Bilateral Distance:    Right Eye Near:   Left Eye Near:    Bilateral Near:     Physical Exam Vitals and nursing note reviewed.  Constitutional:      General: He is not in acute distress.    Appearance: Normal appearance. He is underweight. He is ill-appearing. He is not toxic-appearing or diaphoretic.  HENT:     Head: Normocephalic and atraumatic.     Right Ear: Tympanic membrane,  ear canal and external ear normal.     Left Ear: Tympanic membrane,  ear canal and external ear normal.     Nose: Nose normal. No congestion or rhinorrhea.     Mouth/Throat:     Mouth: Mucous membranes are moist.     Pharynx: Oropharynx is clear. Posterior oropharyngeal erythema present. No oropharyngeal exudate.  Eyes:     General: No scleral icterus.    Extraocular Movements: Extraocular movements intact.  Cardiovascular:     Rate and Rhythm: Normal rate and regular rhythm.  Pulmonary:     Effort: Pulmonary effort is normal. No respiratory distress.     Breath sounds: Normal breath sounds. No wheezing, rhonchi or rales.  Musculoskeletal:     Cervical back: Normal range of motion and neck supple.  Lymphadenopathy:     Cervical: No cervical adenopathy.  Skin:    General: Skin is warm and dry.     Coloration: Skin is pale. Skin is not jaundiced.     Findings: No erythema or rash.  Neurological:     Mental Status: He is alert and oriented to person, place, and time.  Psychiatric:        Behavior: Behavior is cooperative.      UC Treatments / Results  Labs (all labs ordered are listed, but only abnormal results are displayed) Labs Reviewed  POC COVID19/FLU A&B COMBO    EKG   Radiology No results found.  Procedures Procedures (including critical care time)  Medications Ordered in UC Medications - No data to display  Initial Impression / Assessment and Plan / UC Course  I have reviewed the triage vital signs and the nursing notes.  Pertinent labs & imaging results that were available during my care of the patient were reviewed by me and considered in my medical decision making (see chart for details).   In triage, patient has a low-grade fever and is mildly tachycardic, otherwise vital signs are stable.  Patient is elderly and ill-appearing, however is nontoxic appearing.   1. Fever, unspecified fever cause COVID-19 and influenza testing is negative and overall,  examination is reassuring Discussed with the patient that I am concerned regarding ongoing fevers given immunocompromised status and I recommended further evaluation and management in the emergency room for stat blood work and further workup Patient declines and states "I do not think I would make it waiting there for hours" Discussed urgent care capabilities at length with patient, patient declines chest x-ray as he just had 1 with his radiation oncologist I again urged the patient to go to the emergency room for further evaluation and management and patient stated he may go tomorrow Patient departed from urgent care in stable condition  Final Clinical Impressions(s) / UC Diagnoses   Final diagnoses:  Fever, unspecified fever cause     Discharge Instructions      Please go directly to the ER for further evaluation and management of your symptoms.    ED Prescriptions   None    PDMP not reviewed this encounter.   Valentino Nose, NP 10/10/23 [phone]

## 2023-10-10 NOTE — ED Triage Notes (Signed)
 Fever since Thursday. States he does have a cough that is productive at times.   States has recently started a new cancer treatment.  States he had been feeling very fatigued.  States has been feeling chest pain at night when he lays on his sides

## 2023-10-11 ENCOUNTER — Encounter (HOSPITAL_COMMUNITY): Payer: Self-pay

## 2023-10-11 ENCOUNTER — Other Ambulatory Visit: Payer: Self-pay

## 2023-10-11 ENCOUNTER — Emergency Department (HOSPITAL_COMMUNITY)

## 2023-10-11 ENCOUNTER — Ambulatory Visit: Payer: Medicare Other | Admitting: Behavioral Health

## 2023-10-11 ENCOUNTER — Emergency Department (HOSPITAL_COMMUNITY)
Admission: EM | Admit: 2023-10-11 | Discharge: 2023-10-11 | Disposition: A | Discharge status: Home / Self Care | Attending: Emergency Medicine | Admitting: Emergency Medicine

## 2023-10-11 DIAGNOSIS — Z8546 Personal history of malignant neoplasm of prostate: Secondary | ICD-10-CM | POA: Insufficient documentation

## 2023-10-11 DIAGNOSIS — R509 Fever, unspecified: Secondary | ICD-10-CM | POA: Diagnosis present

## 2023-10-11 DIAGNOSIS — C7951 Secondary malignant neoplasm of bone: Secondary | ICD-10-CM | POA: Diagnosis not present

## 2023-10-11 DIAGNOSIS — I10 Essential (primary) hypertension: Secondary | ICD-10-CM | POA: Insufficient documentation

## 2023-10-11 DIAGNOSIS — Z79899 Other long term (current) drug therapy: Secondary | ICD-10-CM | POA: Insufficient documentation

## 2023-10-11 DIAGNOSIS — J189 Pneumonia, unspecified organism: Secondary | ICD-10-CM | POA: Insufficient documentation

## 2023-10-11 DIAGNOSIS — C779 Secondary and unspecified malignant neoplasm of lymph node, unspecified: Secondary | ICD-10-CM | POA: Insufficient documentation

## 2023-10-11 LAB — CBC WITH DIFFERENTIAL/PLATELET
Abs Immature Granulocytes: 0.06 10*3/uL (ref 0.00–0.07)
Basophils Absolute: 0.1 10*3/uL (ref 0.0–0.1)
Basophils Relative: 1 %
Eosinophils Absolute: 0 10*3/uL (ref 0.0–0.5)
Eosinophils Relative: 0 %
HCT: 44.4 % (ref 39.0–52.0)
Hemoglobin: 14.7 g/dL (ref 13.0–17.0)
Immature Granulocytes: 1 %
Lymphocytes Relative: 3 %
Lymphs Abs: 0.3 10*3/uL — ABNORMAL LOW (ref 0.7–4.0)
MCH: 32.1 pg (ref 26.0–34.0)
MCHC: 33.1 g/dL (ref 30.0–36.0)
MCV: 96.9 fL (ref 80.0–100.0)
Monocytes Absolute: 1.2 10*3/uL — ABNORMAL HIGH (ref 0.1–1.0)
Monocytes Relative: 10 %
Neutro Abs: 9.8 10*3/uL — ABNORMAL HIGH (ref 1.7–7.7)
Neutrophils Relative %: 85 %
Platelets: 387 10*3/uL (ref 150–400)
RBC: 4.58 MIL/uL (ref 4.22–5.81)
RDW: 13.7 % (ref 11.5–15.5)
WBC: 11.5 10*3/uL — ABNORMAL HIGH (ref 4.0–10.5)
nRBC: 0 % (ref 0.0–0.2)

## 2023-10-11 LAB — RESP PANEL BY RT-PCR (RSV, FLU A&B, COVID)  RVPGX2
Influenza A by PCR: NEGATIVE
Influenza B by PCR: NEGATIVE
Resp Syncytial Virus by PCR: NEGATIVE
SARS Coronavirus 2 by RT PCR: NEGATIVE

## 2023-10-11 LAB — COMPREHENSIVE METABOLIC PANEL
ALT: 17 U/L (ref 0–44)
AST: 21 U/L (ref 15–41)
Albumin: 3 g/dL — ABNORMAL LOW (ref 3.5–5.0)
Alkaline Phosphatase: 67 U/L (ref 38–126)
Anion gap: 12 (ref 5–15)
BUN: 14 mg/dL (ref 8–23)
CO2: 25 mmol/L (ref 22–32)
Calcium: 9.3 mg/dL (ref 8.9–10.3)
Chloride: 97 mmol/L — ABNORMAL LOW (ref 98–111)
Creatinine, Ser: 0.85 mg/dL (ref 0.61–1.24)
GFR, Estimated: >60 mL/min (ref >60)
Glucose, Bld: 108 mg/dL — ABNORMAL HIGH (ref 70–99)
Potassium: 4 mmol/L (ref 3.5–5.1)
Sodium: 134 mmol/L — ABNORMAL LOW (ref 135–145)
Total Bilirubin: 0.8 mg/dL (ref 0.0–1.2)
Total Protein: 7 g/dL (ref 6.5–8.1)

## 2023-10-11 LAB — URINALYSIS, W/ REFLEX TO CULTURE (INFECTION SUSPECTED)
Bacteria, UA: NONE SEEN
Bilirubin Urine: NEGATIVE
Glucose, UA: NEGATIVE mg/dL
Hgb urine dipstick: NEGATIVE
Ketones, ur: NEGATIVE mg/dL
Leukocytes,Ua: NEGATIVE
Nitrite: NEGATIVE
Protein, ur: 30 mg/dL — AB
Specific Gravity, Urine: 1.016 (ref 1.005–1.030)
pH: 6 (ref 5.0–8.0)

## 2023-10-11 LAB — LACTIC ACID, PLASMA: Lactic Acid, Venous: 1 mmol/L (ref 0.5–1.9)

## 2023-10-11 LAB — APTT: aPTT: 31 s (ref 24–36)

## 2023-10-11 LAB — PROTIME-INR
INR: 1.2 (ref 0.8–1.2)
Prothrombin Time: 15.8 s — ABNORMAL HIGH (ref 11.4–15.2)

## 2023-10-11 MED ORDER — AMOXICILLIN-POT CLAVULANATE 875-125 MG PO TABS
1.0000 | ORAL_TABLET | Freq: Once | ORAL | Status: AC
  Administered 2023-10-11: 1 via ORAL
  Filled 2023-10-11: qty 1

## 2023-10-11 MED ORDER — LACTATED RINGERS IV BOLUS (SEPSIS): 500.0000 mL | Freq: Once | INTRAVENOUS | Status: DC

## 2023-10-11 MED ORDER — AZITHROMYCIN 250 MG PO TABS: 250.0000 mg | ORAL_TABLET | Freq: Every day | ORAL | 0 refills | Status: DC

## 2023-10-11 MED ORDER — AMOXICILLIN-POT CLAVULANATE 875-125 MG PO TABS: 1.0000 | ORAL_TABLET | Freq: Two times a day (BID) | ORAL | 0 refills | Status: DC

## 2023-10-11 MED ORDER — AZITHROMYCIN 250 MG PO TABS
500.0000 mg | ORAL_TABLET | Freq: Once | ORAL | Status: AC
  Administered 2023-10-11: 500 mg via ORAL
  Filled 2023-10-11: qty 2

## 2023-10-11 NOTE — Discharge Instructions (Addendum)
 Your x-ray showed pneumonia.  Your oxygen level looks good and your white count is not worrisome.  Return for worsening symptoms.  Try and keep yourself hydrated.

## 2023-10-11 NOTE — ED Triage Notes (Signed)
 Pt reports Pluvicto treatments could be causing all of his symptoms as he could be experiencing side effects from the treatment.

## 2023-10-11 NOTE — ED Provider Triage Note (Signed)
 Emergency Medicine Provider Triage Evaluation Note  Nathan Smith , a 73 y.o. male with history of prostate cancer with metastasis to bone and lymph nodes currently on Pluvicto and Eligard injections and receiving Lupron injections was evaluated in triage.  Pt complains of intermittent fevers, chills, body aches and nighttime sweats.  States he was seen at urgent care yesterday and recommended to come to the emergency department for evaluation for possible sepsis.  Endorses slight cough and mild diarrhea, no increased nausea vomiting.    Review of Systems  Positive: Sweats, cough, fever or chills, generalized bodyaches Negative: Abdominal pain, shortness of breath  Physical Exam  BP 133/87 (BP Location: Left Arm)   Pulse (!) 102   Temp 98.9 F (37.2 C) (Oral)   Resp 18   Ht 6' (1.829 m)   Wt 62.1 kg   SpO2 97%   BMI 18.58 kg/m  Gen:   Awake, no distress, ill-appearing Resp:  Normal effort  MSK:   Moves extremities without difficulty  Other:    Medical Decision Making  Medically screening exam initiated at 3:04 PM.  Appropriate orders placed.  Dezi Schaner was informed that the remainder of the evaluation will be completed by another provider, this initial triage assessment does not replace that evaluation, and the importance of remaining in the ED until their evaluation is complete.     Pauline Aus, PA-C 10/11/23 1511

## 2023-10-11 NOTE — ED Triage Notes (Signed)
 Pt arrived via POV from home c/o fever, cough, fatigue since last Thursday. Pt reports recently starting new cancer treatment. Pt seen at Urgent Care yesterday for same.

## 2023-10-11 NOTE — ED Provider Notes (Signed)
 [place] [facility]GENCY DEPARTMENT AT [facility] Provider Note   CSN: [id number] Arrival date & time: 10/11/23  1312     History  Chief Complaint  Patient presents with   Fever    Nathan Smith is a 73 y.o. male.   Fever Patient has metastatic prostate cancer.  Is on Pluvicto and Eligard.  Had last injection on Thursday.  Today is also Thursday of the next week.  States has been having fevers.  Go up to 103.  Has had some fatigue.  Night sweats.  No cough.  No dysuria.  No diarrhea but has had some nausea.  No abdominal pain.  Seen at urgent care yesterday.  Does have a port on his right chest.    Past Medical History:  Diagnosis Date   Anxiety    Bilateral hydronephrosis    Bladder incontinence    night time,  past 2 months   BPH (benign prostatic hypertrophy) with urinary obstruction    Depression    ED (erectile dysfunction)    H/O ascites    trace   Hypertension    Port-A-Cath in place 11/13/2022   Prostate cancer (HCC) 09/24/2012   gleason 4+3=7., & 4+4=8,PSA=67.30, volume=33.7cc    Home Medications Prior to Admission medications   Medication Sig Start Date End Date Taking? Authorizing Provider  amoxicillin-clavulanate (AUGMENTIN) 875-125 MG tablet Take 1 tablet by mouth every 12 (twelve) hours. 10/11/23  Yes Benjiman Core, MD  azithromycin (ZITHROMAX) 250 MG tablet Take 1 tablet (250 mg total) by mouth daily. 10/11/23  Yes Benjiman Core, MD  Ascorbic Acid (VITAMIN C) 1000 MG tablet Take 1,000 mg by mouth daily.    [provider]  b complex vitamins capsule Take 1 capsule by mouth daily.    [provider]  Bacillus Coagulans-Inulin (PROBIOTIC) 1-250 BILLION-MG CAPS Take by mouth daily.     [provider]  Cabazitaxel (JEVTANA IV) Inject into the vein every 21 ( twenty-one) days. 11/21/22   [provider]  Cholecalciferol 125 MCG (5000 UT) capsule Take 5,000 Units by mouth daily.     [provider]   Ginseng 100 MG CAPS Take by mouth daily.     [provider]  L-ARGININE PO Take 1,000 mg by mouth daily.     [provider]  lidocaine-prilocaine (EMLA) cream Apply a small amount to port a cath site and cover with plastic wrap one hour prior to infusion appointments 11/13/22   Doreatha Massed, MD  losartan (COZAAR) 25 MG tablet TAKE 1 TABLET(25 MG) BY MOUTH DAILY 02/12/23   Doreatha Massed, MD  melatonin 1 MG TABS tablet Take 2 mg by mouth at bedtime.     [provider]  methylphenidate (RITALIN) 5 MG tablet Take 0.5 tablets (2.5 mg total) by mouth daily as needed. 07/12/21   Doreatha Massed, MD  ondansetron (ZOFRAN) 4 MG tablet Take 1 tablet (4 mg total) by mouth every 8 (eight) hours as needed for nausea or vomiting. 03/27/23   Doreatha Massed, MD  Probiotic Product (ALIGN) 4 MG CAPS Take 1 capsule by mouth daily.     [provider]  prochlorperazine (COMPAZINE) 10 MG tablet Take 1 tablet (10 mg total) by mouth every 6 (six) hours as needed for nausea or vomiting. 11/13/22   Doreatha Massed, MD  tamsulosin (FLOMAX) 0.4 MG CAPS capsule TAKE 1 CAPSULE(0.4 MG) BY MOUTH DAILY 02/12/23   Doreatha Massed, MD  zinc gluconate 50 MG tablet Take 50 mg by  mouth daily.    [provider]      Allergies    Patient has no known allergies.    Review of Systems   Review of Systems  Constitutional:  Positive for fever.    Physical Exam Updated Vital Signs BP 133/87 (BP Location: Left Arm)   Pulse (!) 102   Temp 98.9 F (37.2 C) (Oral)   Resp 18   Ht 6' (1.829 m)   Wt 62.1 kg   SpO2 97%   BMI 18.58 kg/m  Physical Exam Vitals and nursing note reviewed.  HENT:     Head: Normocephalic.  Cardiovascular:     Rate and Rhythm: Regular rhythm.  Chest:     Chest wall: No tenderness.  Abdominal:     Tenderness: There is no abdominal tenderness.  Skin:    Capillary Refill: Capillary refill takes less than 2 seconds.   Neurological:     Mental Status: He is alert and oriented to person, place, and time.     ED Results / Procedures / Treatments   Labs (all labs ordered are listed, but only abnormal results are displayed) Labs Reviewed  PROTIME-INR - Abnormal; Notable for the following components:      Result Value   Prothrombin Time 15.8 (*)    All other components within normal limits  URINALYSIS, W/ REFLEX TO CULTURE (INFECTION SUSPECTED) - Abnormal; Notable for the following components:   Protein, ur 30 (*)    All other components within normal limits  CBC WITH DIFFERENTIAL/PLATELET - Abnormal; Notable for the following components:   WBC 11.5 (*)    Neutro Abs 9.8 (*)    Lymphs Abs 0.3 (*)    Monocytes Absolute 1.2 (*)    All other components within normal limits  COMPREHENSIVE METABOLIC PANEL - Abnormal; Notable for the following components:   Sodium 134 (*)    Chloride 97 (*)    Glucose, Bld 108 (*)    Albumin 3.0 (*)    All other components within normal limits  RESP PANEL BY RT-PCR (RSV, FLU A&B, COVID)  RVPGX2  CULTURE, BLOOD (ROUTINE X 2)  CULTURE, BLOOD (ROUTINE X 2)  LACTIC ACID, PLASMA  APTT  LACTIC ACID, PLASMA  CBC WITH DIFFERENTIAL/PLATELET    EKG None  Radiology DG Chest Port 1 View Result Date: 10/11/2023 CLINICAL DATA:  Cough, fever. EXAM: PORTABLE CHEST 1 VIEW COMPARISON:  October 04, 2023. FINDINGS: The heart size and mediastinal contours are within normal limits. Right internal jugular Port-A-Cath is again noted. Left lung is clear. Right middle lobe opacity is noted concerning for possible pneumonia. The visualized skeletal structures are unremarkable. IMPRESSION: Right middle lobe opacity concerning for pneumonia. Followup PA and lateral chest X-ray is recommended in 3-4 weeks following trial of antibiotic therapy to ensure resolution and exclude underlying malignancy. Electronically Signed   By: Lupita Raider M.D.   On: 10/11/2023 18:06     Procedures Procedures    Medications Ordered in ED Medications  lactated ringers bolus 500 mL (500 mLs Intravenous Not Given 10/11/23 1610)  amoxicillin-clavulanate (AUGMENTIN) 875-125 MG per tablet 1 tablet (has no administration in time range)  azithromycin (ZITHROMAX) tablet 500 mg (has no administration in time range)    ED Course/ Medical Decision Making/ A&P                                 Medical Decision Making Risk Prescription  drug management.    PATIENT WITH FEVERS.  HE IS ON THERAPY FOR metastatic prostate cancer.  No definite source of fever.  Differential diagnosis includes medication reaction but also causes such as bacteremia.  Did have negative flu and COVID antigen testing yesterday.  Will get PCR testing today.  No real cough.  Lungs are clear.  Will get chest x-ray.  Will get blood cultures.  Benign abdominal exam.  X-ray shows pneumonia.  Blood work reassuring.  Normal lactic acid.  Not hypoxic.  Patient is eager to go home.  Given first dose antibiotics here.  Appears stable for discharge home.  Blood cultures been pending.        Final Clinical Impression(s) / ED Diagnoses Final diagnoses:  Community acquired pneumonia, unspecified laterality    Rx / DC Orders ED Discharge Orders          Ordered    amoxicillin-clavulanate (AUGMENTIN) 875-125 MG tablet  Every 12 hours        10/11/23 1828    azithromycin (ZITHROMAX) 250 MG tablet  Daily        10/11/23 1828              Benjiman Core, MD 10/11/23 1828

## 2023-10-16 LAB — CULTURE, BLOOD (ROUTINE X 2)
Culture: NO GROWTH
Culture: NO GROWTH
Special Requests: ADEQUATE

## 2023-11-01 ENCOUNTER — Other Ambulatory Visit: Payer: Self-pay | Admitting: *Deleted

## 2023-11-01 DIAGNOSIS — J189 Pneumonia, unspecified organism: Secondary | ICD-10-CM

## 2023-11-02 ENCOUNTER — Ambulatory Visit (HOSPITAL_COMMUNITY)
Admission: RE | Admit: 2023-11-02 | Discharge: 2023-11-02 | Disposition: A | Discharge status: Home / Self Care | Source: Ambulatory Visit | Attending: Hematology | Admitting: Hematology

## 2023-11-02 ENCOUNTER — Other Ambulatory Visit: Payer: Self-pay

## 2023-11-02 DIAGNOSIS — J189 Pneumonia, unspecified organism: Secondary | ICD-10-CM | POA: Insufficient documentation

## 2023-11-02 DIAGNOSIS — C61 Malignant neoplasm of prostate: Secondary | ICD-10-CM

## 2023-11-05 ENCOUNTER — Inpatient Hospital Stay: Payer: Medicare Other | Attending: Hematology

## 2023-11-05 DIAGNOSIS — R530 Neoplastic (malignant) related fatigue: Secondary | ICD-10-CM | POA: Insufficient documentation

## 2023-11-05 DIAGNOSIS — R110 Nausea: Secondary | ICD-10-CM | POA: Diagnosis not present

## 2023-11-05 DIAGNOSIS — Z923 Personal history of irradiation: Secondary | ICD-10-CM | POA: Diagnosis not present

## 2023-11-05 DIAGNOSIS — C7951 Secondary malignant neoplasm of bone: Secondary | ICD-10-CM | POA: Diagnosis present

## 2023-11-05 DIAGNOSIS — Z1509 Genetic susceptibility to other malignant neoplasm: Secondary | ICD-10-CM | POA: Insufficient documentation

## 2023-11-05 DIAGNOSIS — I10 Essential (primary) hypertension: Secondary | ICD-10-CM | POA: Diagnosis not present

## 2023-11-05 DIAGNOSIS — Z79899 Other long term (current) drug therapy: Secondary | ICD-10-CM | POA: Insufficient documentation

## 2023-11-05 DIAGNOSIS — C772 Secondary and unspecified malignant neoplasm of intra-abdominal lymph nodes: Secondary | ICD-10-CM | POA: Insufficient documentation

## 2023-11-05 DIAGNOSIS — C61 Malignant neoplasm of prostate: Secondary | ICD-10-CM | POA: Insufficient documentation

## 2023-11-05 DIAGNOSIS — Z8042 Family history of malignant neoplasm of prostate: Secondary | ICD-10-CM | POA: Diagnosis not present

## 2023-11-05 LAB — CBC WITH DIFFERENTIAL/PLATELET
Abs Immature Granulocytes: 0.01 10*3/uL (ref 0.00–0.07)
Basophils Absolute: 0.1 10*3/uL (ref 0.0–0.1)
Basophils Relative: 1 %
Eosinophils Absolute: 0.2 10*3/uL (ref 0.0–0.5)
Eosinophils Relative: 5 %
HCT: 43.4 % (ref 39.0–52.0)
Hemoglobin: 14 g/dL (ref 13.0–17.0)
Immature Granulocytes: 0 %
Lymphocytes Relative: 19 %
Lymphs Abs: 0.8 10*3/uL (ref 0.7–4.0)
MCH: 31.7 pg (ref 26.0–34.0)
MCHC: 32.3 g/dL (ref 30.0–36.0)
MCV: 98.2 fL (ref 80.0–100.0)
Monocytes Absolute: 0.3 10*3/uL (ref 0.1–1.0)
Monocytes Relative: 8 %
Neutro Abs: 2.9 10*3/uL (ref 1.7–7.7)
Neutrophils Relative %: 67 %
Platelets: 244 10*3/uL (ref 150–400)
RBC: 4.42 MIL/uL (ref 4.22–5.81)
RDW: 13.8 % (ref 11.5–15.5)
WBC: 4.3 10*3/uL (ref 4.0–10.5)
nRBC: 0 % (ref 0.0–0.2)

## 2023-11-05 LAB — COMPREHENSIVE METABOLIC PANEL WITH GFR
ALT: 18 U/L (ref 0–44)
AST: 27 U/L (ref 15–41)
Albumin: 3.5 g/dL (ref 3.5–5.0)
Alkaline Phosphatase: 60 U/L (ref 38–126)
Anion gap: 7 (ref 5–15)
BUN: 16 mg/dL (ref 8–23)
CO2: 29 mmol/L (ref 22–32)
Calcium: 9.5 mg/dL (ref 8.9–10.3)
Chloride: 101 mmol/L (ref 98–111)
Creatinine, Ser: 0.95 mg/dL (ref 0.61–1.24)
GFR, Estimated: >60 mL/min (ref >60)
Glucose, Bld: 97 mg/dL (ref 70–99)
Potassium: 4 mmol/L (ref 3.5–5.1)
Sodium: 137 mmol/L (ref 135–145)
Total Bilirubin: 0.8 mg/dL (ref 0.0–1.2)
Total Protein: 6.9 g/dL (ref 6.5–8.1)

## 2023-11-05 LAB — MAGNESIUM: Magnesium: 2.2 mg/dL (ref 1.7–2.4)

## 2023-11-05 LAB — PSA: Prostatic Specific Antigen: 1.8 ng/mL (ref 0.00–4.00)

## 2023-11-07 NOTE — Progress Notes (Signed)
 Reviewed by Dr. Ellin Saba.  No further recommendations at this time.

## 2023-11-07 NOTE — Progress Notes (Signed)
 [facility] [address] [place] [address]    Clinic Day:  11/08/23    Referring physician: Doreatha Massed, MD  Patient Care Team: Patient, No Pcp Per as PCP - General (General Practice) Doreatha Massed, MD as Consulting Physician (Oncology) Mickie Bail, RN as Oncology Nurse Navigator (Oncology)   ASSESSMENT & PLAN:   Assessment: 1.  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 -External beam radiation with 2 years of Lupron from 05/27/2013 through 07/28/2013.  Received Lupron until 09/28/2014. -Rising PSA levels in December 2018, of 2.8.  Local recurrence was confirmed with biopsy. -Bone scan on 04/12/2018 did not show any evidence of metastatic disease. -He was evaluated by Dr. Greggory Stallion at [facility].  Bone scan and CT scan at [facility] on 07/02/2018 did not show any evidence of metastatic disease.  Left para-aortic lymph node measures 8 mm.  Lupron started for nonmetastatic HSPC in November 2019 for rapid PSA doubling time of less than 3 months.  He had difficulty tolerating Lupron with irritability, low energy and fatigue. -PSA 5.7 (07/08/2019), PSA 11.1 (11/26/2019) with testosterone 18. -Fluciclovine PET CT scan on 12/23/2019 showed T11 meta stasis.  Asymmetric hypermetabolism within the prostate suspicious for residual disease.  Mild enlargement of 2 adjacent lower left para-aortic nodes, largest 8 mm with SUV of 3.4.  These nodes measured maximally 5 mm on PET scan on 02/25/2018. -Abiraterone 500 mg daily and prednisone from 01/03/2020 through 02/16/2020, discontinued secondary to elevated LFTs. -We will consider checking germline and somatic mutation testing. -Lupron 45 mg on 02/17/2020 at Dr. Lenoria Chime office. -Enzalutamide from 03/10/2020 through 10/17/2022 with progression. - Guardant360: MSI high not detected. NOTCH1 mutation. - Recommended cabazitaxel to minimize  incidence of neuropathy as he is a Technical sales engineer and plays guitar. - Cycle 1 cabazitaxel on 11/21/2022, cycle 8 on 04/17/2023, held due to poor tolerance including fatigue, mood changes. - Pluvicto cycle 1 on 05/31/2023, cycle 2 on 07/12/2023   2.  Heterozygosity for MUTYH mutation: - Cologuard test was refused previously.    Plan: 1.  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 - He usually feels tired for 4 days after each infusion.  Denies any dry mouth. - He was evaluated in the ER with chest pain and was diagnosed with pneumonia on 10/11/2023.  He was treated with Augmentin and azithromycin.  He reports improvement in the chest pain.  He also had fatigue and lost about 9 pounds since January. - I have reviewed chest x-ray from 11/02/2023 showed improvement in the right lung middle lobe infiltrate. - We reviewed his labs: Normal LFTs and creatinine.  CBC normal.  PSA improved to 1.8. - He may proceed with his Pluvicto # 5 next week.  RTC 6 weeks for follow-up with repeat labs and PSA.   2.  Bone metastasis: - Denosumab was discussed previously and refused.  Continue calcium and vitamin D supplements.   3.  Hypertension: - Continue losartan 25 mg daily.  Blood pressure today is 120/74.   4.  Cancer-related fatigue: - Continue Ritalin 1/6 of tablet as needed.  5.  Nausea: - He has occasional nausea.  Continue Zofran 4 mg every 8 hours as needed.    Orders Placed This Encounter  Procedures   CBC with Differential  Standing Status:   Future    Expected Date:   12/19/2023    Expiration Date:   11/07/2024   Comprehensive metabolic panel    Standing Status:   Future    Expected Date:   12/19/2023    Expiration Date:   11/07/2024   Magnesium    Standing Status:   Future    Expected Date:   12/19/2023    Expiration Date:   11/07/2024   PSA    Standing Status:   Future    Expected Date:   12/19/2023     Expiration Date:   11/07/2024      Mikeal Hawthorne R Teague,acting as a scribe for Doreatha Massed, MD.,have documented all relevant documentation on the behalf of Doreatha Massed, MD,as directed by  Doreatha Massed, MD while in the presence of Doreatha Massed, MD.  I, Doreatha Massed MD, have reviewed the above documentation for accuracy and completeness, and I agree with the above.      Doreatha Massed, MD   4/3/20254:03 PM  CHIEF COMPLAINT:   Diagnosis: castration resistant prostate cancer    Cancer Staging  Prostate cancer [facility]) Staging form: Prostate, AJCC 7th Edition - Clinical stage from 12/26/2019: Stage IV (yTX, N1, M1b, PSA: 10 to 19, Gleason 8-10) - Signed by Doreatha Massed, MD on 12/26/2019    Prior Therapy: 1. XRT 05/27/2013 - 07/28/2013  2. Lupron 03/2013 - 09/28/14 3. Abiraterone 12/24/19 - 02/16/20 4. enzalutamide 80 mg QD 03/10/20 - 10/17/22  Current Therapy:  cabazitaxel; Lupron every 6 months    HISTORY OF PRESENT ILLNESS:   Oncology History  Prostate cancer (HCC)  09/24/2012 Initial Diagnosis   Prostate cancer (HCC)   12/26/2019 Cancer Staging   Staging form: Prostate, AJCC 7th Edition - Clinical stage from 12/26/2019: Stage IV (yTX, N1, M1b, PSA: 10 to 19, Gleason 8-10) - Signed by Doreatha Massed, MD on 12/26/2019   08/24/2020 Genetic Testing   Positive genetic testing:  A single, 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 Testing was completed through the Common Hereditary Cancers panel and Prostate Cancer HRR panel offered by [facility] laboratories. The report date is 08/24/2020.   The Common Hereditary Cancers Panel offered by [facility] includes sequencing and/or deletion duplication testing of the following 47 genes: APC, ATM, AXIN2, BARD1, BMPR1A, BRCA1, BRCA2, BRIP1, CDH1, CDK4, CDKN2A (p14ARF), CDKN2A (p16INK4a), CHEK2, CTNNA1, DICER1, EPCAM (Deletion/duplication testing only), GREM1 (promoter  region deletion/duplication testing only), KIT, MEN1, MLH1, MSH2, MSH3, MSH6, MUTYH, NBN, NF1, NTHL1, PALB2, PDGFRA, PMS2, POLD1, POLE, PTEN, RAD50, RAD51C, RAD51D, SDHB, SDHC, SDHD, SMAD4, SMARCA4. STK11, TP53, TSC1, TSC2, and VHL.  The following genes were evaluated for sequence changes only: SDHA and HOXB13 c.251G>A variant only. The Prostate Cancer HRR Panel offered by [facility] includes sequencing and/or deletion/duplication analysis of the following 10 genes: ATM, BARD1, BRCA1, BRCA2, BRIP1, CHEK2, FANCL, PALB2, RAD51C, and RAD51D.   11/21/2022 -  Chemotherapy   Patient is on Treatment Plan : PROSTATE Cabazitaxel (20) D1 + Prednisone D1-21 q21d        INTERVAL HISTORY:   Corban is a 73 y.o. male presenting to clinic today for follow up of castration resistant prostate cancer. He was last seen by me on 08/16/23.  Since his last visit, he presented to the ED on 10/11/23 for pneumonia with fever and was discharged with Augmentin and azithromycin.    Rhys underwent Chest x ray on 11/02/23.   Today, he states that he is doing well overall. His  appetite level is at 100%. His energy level is at 25%.    PAST MEDICAL HISTORY:   Past Medical History: Past Medical History:  Diagnosis Date   Anxiety    Bilateral hydronephrosis    Bladder incontinence    night time,  past 2 months   BPH (benign prostatic hypertrophy) with urinary obstruction    Depression    ED (erectile dysfunction)    H/O ascites    trace   Hypertension    Port-A-Cath in place 11/13/2022   Prostate cancer (HCC) 09/24/2012   gleason 4+3=7., & 4+4=8,PSA=67.30, volume=33.7cc    Surgical History: Past Surgical History:  Procedure Laterality Date   IR IMAGING GUIDED PORT INSERTION  11/16/2022   PROSTATE BIOPSY  09/24/12   Adenocarcinoma   PROSTATE BIOPSY N/A 12/20/2017   Procedure: BIOPSY TRANSRECTAL ULTRASONIC PROSTATE (TUBP);  Surgeon: Marcine Matar, MD;  Location: [facility];  Service: Urology;  Laterality: N/A;    TONSILLECTOMY      Social History: Social History   Socioeconomic History   Marital status: Single    Spouse name: Not on file   Number of children: 0   Years of education: Not on file   Highest education level: Not on file  Occupational History   Occupation: [name]    Employer: [employer]  Tobacco Use   Smoking status: Never   Smokeless tobacco: Never  Vaping Use   Vaping status: Never Used  Substance and Sexual Activity   Alcohol use: Yes    Alcohol/week: 2.0 standard drinks of alcohol    Types: 2 Cans of beer per week    Comment: 3 per day, beer/wine , hx etoh abuse years ago   Drug use: No    Comment: hx   Sexual activity: Yes    Birth control/protection: Condom  Other Topics Concern   Not on file  Social History Narrative   Not on file   Social Drivers of Health   Financial Resource Strain: Low Risk  (12/08/2019)   Overall Financial Resource Strain (CARDIA)    Difficulty of Paying Living Expenses: Not very hard  Food Insecurity: No Food Insecurity (12/08/2019)   Hunger Vital Sign    Worried About Running Out of Food in the Last Year: Never true    Ran Out of Food in the Last Year: Never true  Transportation Needs: No Transportation Needs (12/08/2019)   PRAPARE - Administrator, Civil Service (Medical): No    Lack of Transportation (Non-Medical): No  Physical Activity: Sufficiently Active (12/08/2019)   Exercise Vital Sign    Days of Exercise per Week: 6 days    Minutes of Exercise per Session: 60 min  Stress: Stress Concern Present (12/08/2019)   [employer] of Occupational Health - Occupational Stress Questionnaire    Feeling of Stress : To some extent  Social Connections: Unknown (12/08/2019)   Social Connection and Isolation Panel [NHANES]    Frequency of Communication with Friends and Family: Twice a week    Frequency of Social Gatherings with Friends and Family: Once a week    Attends Religious Services: More than 4 times  per year    Active Member of [employer] or Organizations: No    Attends Engineer, structural: More than 4 times per year    Marital Status: Not on file  Intimate Partner Violence: Not At Risk (12/08/2019)   Humiliation, Afraid, Rape, and Kick questionnaire    Fear of Current or Ex-Partner: No  Emotionally Abused: No    Physically Abused: No    Sexually Abused: No    Family History: Family History  Problem Relation Age of Onset   Depression Mother    Dementia Mother    Anxiety disorder Mother    Prostate cancer Father        unconfirmed diagnosis    Current Medications:  Current Outpatient Medications:    amoxicillin-clavulanate (AUGMENTIN) 875-125 MG tablet, Take 1 tablet by mouth every 12 (twelve) hours., Disp: 13 tablet, Rfl: 0   Ascorbic Acid (VITAMIN C) 1000 MG tablet, Take 1,000 mg by mouth daily., Disp: , Rfl:    azithromycin (ZITHROMAX) 250 MG tablet, Take 1 tablet (250 mg total) by mouth daily., Disp: 4 tablet, Rfl: 0   b complex vitamins capsule, Take 1 capsule by mouth daily., Disp: , Rfl:    Bacillus Coagulans-Inulin (PROBIOTIC) 1-250 BILLION-MG CAPS, Take by mouth daily. , Disp: , Rfl:    Cabazitaxel (JEVTANA IV), Inject into the vein every 21 ( twenty-one) days., Disp: , Rfl:    Cholecalciferol 125 MCG (5000 UT) capsule, Take 5,000 Units by mouth daily. , Disp: , Rfl:    Ginseng 100 MG CAPS, Take by mouth daily. , Disp: , Rfl:    L-ARGININE PO, Take 1,000 mg by mouth daily. , Disp: , Rfl:    lidocaine-prilocaine (EMLA) cream, Apply a small amount to port a cath site and cover with plastic wrap one hour prior to infusion appointments, Disp: 30 g, Rfl: 3   losartan (COZAAR) 25 MG tablet, TAKE 1 TABLET(25 MG) BY MOUTH DAILY, Disp: 90 tablet, Rfl: 3   melatonin 1 MG TABS tablet, Take 2 mg by mouth at bedtime. , Disp: , Rfl:    methylphenidate (RITALIN) 5 MG tablet, Take 0.5 tablets (2.5 mg total) by mouth daily as needed., Disp: 10 tablet, Rfl: 0   ondansetron  (ZOFRAN) 4 MG tablet, Take 1 tablet (4 mg total) by mouth every 8 (eight) hours as needed for nausea or vomiting., Disp: 60 tablet, Rfl: 2   Probiotic Product (ALIGN) 4 MG CAPS, Take 1 capsule by mouth daily. , Disp: , Rfl:    prochlorperazine (COMPAZINE) 10 MG tablet, Take 1 tablet (10 mg total) by mouth every 6 (six) hours as needed for nausea or vomiting., Disp: 60 tablet, Rfl: 1   tamsulosin (FLOMAX) 0.4 MG CAPS capsule, TAKE 1 CAPSULE(0.4 MG) BY MOUTH DAILY, Disp: 90 capsule, Rfl: 3   zinc gluconate 50 MG tablet, Take 50 mg by mouth daily., Disp: , Rfl:  No current facility-administered medications for this visit.  Facility-Administered Medications Ordered in Other Visits:    fulvestrant (FASLODEX) 250 MG/5ML injection, , , ,    Allergies: No Known Allergies  REVIEW OF SYSTEMS:   Review of Systems  Constitutional:  Positive for fatigue. Negative for chills and fever.  HENT:   Negative for lump/mass, mouth sores, nosebleeds, sore throat and trouble swallowing.   Eyes:  Negative for eye problems.  Respiratory:  Negative for cough and shortness of breath.   Cardiovascular:  Positive for chest pain. Negative for leg swelling and palpitations.  Gastrointestinal:  Positive for constipation and nausea. Negative for abdominal pain, diarrhea and vomiting.  Genitourinary:  Negative for bladder incontinence, difficulty urinating, dysuria, frequency, hematuria and nocturia.   Musculoskeletal:  Negative for arthralgias, back pain, flank pain, myalgias and neck pain.  Skin:  Negative for itching and rash.  Neurological:  Positive for numbness. Negative for dizziness and headaches.  Hematological:  Does not bruise/bleed easily.  Psychiatric/Behavioral:  Negative for depression, sleep disturbance and suicidal ideas. The patient is not nervous/anxious.   All other systems reviewed and are negative.    VITALS:   Blood pressure 121/74, pulse 80, temperature (!) 97.4 F (36.3 C), temperature  source Oral, resp. rate 18, weight 133 lb 13.1 oz (60.7 kg), SpO2 100%.  Wt Readings from Last 3 Encounters:  11/08/23 133 lb 13.1 oz (60.7 kg)  10/11/23 137 lb (62.1 kg)  08/21/23 141 lb 9.6 oz (64.2 kg)    Body mass index is 18.15 kg/m.  Performance status (ECOG): 1 - Symptomatic but completely ambulatory  PHYSICAL EXAM:   Physical Exam Vitals and nursing note reviewed. Exam conducted with a chaperone present.  Constitutional:      Appearance: Normal appearance.  Cardiovascular:     Rate and Rhythm: Normal rate and regular rhythm.     Pulses: Normal pulses.     Heart sounds: Normal heart sounds.  Pulmonary:     Effort: Pulmonary effort is normal.     Breath sounds: Normal breath sounds.  Abdominal:     Palpations: Abdomen is soft. There is no hepatomegaly, splenomegaly or mass.     Tenderness: There is no abdominal tenderness.  Musculoskeletal:     Right lower leg: No edema.     Left lower leg: No edema.  Lymphadenopathy:     Cervical: No cervical adenopathy.     Right cervical: No superficial, deep or posterior cervical adenopathy.    Left cervical: No superficial, deep or posterior cervical adenopathy.     Upper Body:     Right upper body: No supraclavicular or axillary adenopathy.     Left upper body: No supraclavicular or axillary adenopathy.  Neurological:     General: No focal deficit present.     Mental Status: He is alert and oriented to person, place, and time.  Psychiatric:        Mood and Affect: Mood normal.        Behavior: Behavior normal.     LABS:      Latest Ref Rng & Units 11/05/2023    2:00 PM 10/11/2023    3:41 PM 09/25/2023    2:28 PM  CBC  WBC 4.0 - 10.5 K/uL 4.3  11.5  5.8   Hemoglobin 13.0 - 17.0 g/dL 16.1  09.6  04.5   Hematocrit 39.0 - 52.0 % 43.4  44.4  48.3   Platelets 150 - 400 K/uL 244  387  264       Latest Ref Rng & Units 11/05/2023    2:00 PM 10/11/2023    3:41 PM 09/25/2023    2:28 PM  CMP  Glucose 70 - 99 mg/dL 97  409  811    BUN 8 - 23 mg/dL 16  14  11    Creatinine 0.61 - 1.24 mg/dL 9.14  7.82  9.56   Sodium 135 - 145 mmol/L 137  134  140   Potassium 3.5 - 5.1 mmol/L 4.0  4.0  4.1   Chloride 98 - 111 mmol/L 101  97  101   CO2 22 - 32 mmol/L 29  25  28    Calcium 8.9 - 10.3 mg/dL 9.5  9.3  9.7   Total Protein 6.5 - 8.1 g/dL 6.9  7.0  7.3   Total Bilirubin 0.0 - 1.2 mg/dL 0.8  0.8  1.0   Alkaline Phos 38 - 126 U/L 60  67  72   AST 15 - 41 U/L 27  21  17    ALT 0 - 44 U/L 18  17  11       No results found for: "CEA1", "CEA" / No results found for: "CEA1", "CEA" Lab Results  Component Value Date   PSA1 0.3 08/30/2021   No results found for: "ZOX096" No results found for: "CAN125"  No results found for: "TOTALPROTELP", "ALBUMINELP", "A1GS", "A2GS", "BETS", "BETA2SER", "GAMS", "MSPIKE", "SPEI" No results found for: "TIBC", "FERRITIN", "IRONPCTSAT" Lab Results  Component Value Date   LDH 129 03/22/2020     STUDIES:   DG Chest 2 View Result Date: 11/07/2023 CLINICAL DATA:  Pneumonia.  History of prostate cancer EXAM: CHEST - 2 VIEW COMPARISON:  X-ray 10/11/2023 FINDINGS: Hyperinflation. No pneumothorax, effusion or edema. Normal cardiopericardial silhouette. Right IJ chest port in place with tip along the central SVC. The consolidation in middle lobe is slightly decreased today with significant residual. Recommend continued follow-up. Degenerative changes along the spine. Sclerosis of the lower thoracic spine vertebral level. Please correlate with history of prostate cancer. IMPRESSION: Slight reduction in size and extent of the middle lobe infiltrate. Significant residual however. Recommend follow-up. Hyperinflation. Chest port. Known bone metastases from prostate cancer Electronically Signed   By: Karen Kays M.D.   On: 11/07/2023 11:41   DG Chest Port 1 View Result Date: 10/11/2023 CLINICAL DATA:  Cough, fever. EXAM: PORTABLE CHEST 1 VIEW COMPARISON:  October 04, 2023. FINDINGS: The heart size and  mediastinal contours are within normal limits. Right internal jugular Port-A-Cath is again noted. Left lung is clear. Right middle lobe opacity is noted concerning for possible pneumonia. The visualized skeletal structures are unremarkable. IMPRESSION: Right middle lobe opacity concerning for pneumonia. Followup PA and lateral chest X-ray is recommended in 3-4 weeks following trial of antibiotic therapy to ensure resolution and exclude underlying malignancy. Electronically Signed   By: Lupita Raider M.D.   On: 10/11/2023 18:06

## 2023-11-08 ENCOUNTER — Inpatient Hospital Stay: Payer: Medicare Other | Attending: Hematology | Admitting: Hematology

## 2023-11-08 ENCOUNTER — Other Ambulatory Visit: Payer: Self-pay

## 2023-11-08 VITALS — BP 121/74 | HR 80 | Temp 97.4°F | Resp 18 | Wt 133.8 lb

## 2023-11-08 DIAGNOSIS — C7951 Secondary malignant neoplasm of bone: Secondary | ICD-10-CM | POA: Insufficient documentation

## 2023-11-08 DIAGNOSIS — C61 Malignant neoplasm of prostate: Secondary | ICD-10-CM | POA: Insufficient documentation

## 2023-11-08 DIAGNOSIS — R530 Neoplastic (malignant) related fatigue: Secondary | ICD-10-CM | POA: Diagnosis not present

## 2023-11-08 DIAGNOSIS — I10 Essential (primary) hypertension: Secondary | ICD-10-CM | POA: Diagnosis not present

## 2023-11-08 MED ORDER — ONDANSETRON HCL 4 MG PO TABS: 4.0000 mg | ORAL_TABLET | Freq: Three times a day (TID) | ORAL | 2 refills | Status: DC | PRN

## 2023-11-08 NOTE — Patient Instructions (Addendum)
 [facility] at [facility] Discharge Instructions   You were seen and examined today by Dr. Ellin Saba.  He reviewed the results of your lab work which are normal/stable. Your PSA was 1.80.   You may proceed with your next Pluvicto treatment.   Return as scheduled.    Thank you for choosing [facility] at [facility] to provide your oncology and hematology care.  To afford each patient quality time with our provider, please arrive at least 15 minutes before your scheduled appointment time.   If you have a lab appointment with the Cancer Center please come in thru the Main Entrance and check in at the main information desk.  You need to re-schedule your appointment should you arrive 10 or more minutes late.  We strive to give you quality time with our providers, and arriving late affects you and other patients whose appointments are after yours.  Also, if you no show three or more times for appointments you may be dismissed from the clinic at the providers discretion.     Again, thank you for choosing [facility].  Our hope is that these requests will decrease the amount of time that you wait before being seen by our physicians.       _____________________________________________________________  Should you have questions after your visit to [facility], please contact our office at [phone] and follow the prompts.  Our office hours are 8:00 a.m. and 4:30 p.m. Monday - Friday.  Please note that voicemails left after 4:00 p.m. may not be returned until the following business day.  We are closed weekends and major holidays.  You do have access to a nurse 24-7, just call the main number to the clinic [phone] and do not press any options, hold on the line and a nurse will answer the phone.    For prescription refill requests, have your pharmacy contact our office and allow 72 hours.    Due to Covid, you will need to  wear a mask upon entering the hospital. If you do not have a mask, a mask will be given to you at the Main Entrance upon arrival. For doctor visits, patients may have 1 support person age 65 or older with them. For treatment visits, patients can not have anyone with them due to social distancing guidelines and our immunocompromised population.

## 2023-11-12 NOTE — Written Directive (Cosign Needed)
  PLUVICTO  THERAPY   RADIOPHARMACEUTICAL: Lutetium 177 vipivotide tetraxetan (Pluvicto)     PRESCRIBED DOSE FOR ADMINISTRATION:  200 mCi   ROUTE OFADMINISTRATION:  IV   DIAGNOSIS:  prostate cancer   REFERRING PHYSICIAN: Dr. Elbert Ewings   TREATMENT #:5   ADDITIONAL PHYSICIAN COMMENTS/NOTES:   AUTHORIZED USER SIGNATURE & TIME STAMP: Patriciaann Clan, MD   11/13/23    12:11 PM

## 2023-11-13 ENCOUNTER — Ambulatory Visit (HOSPITAL_COMMUNITY)
Admission: RE | Admit: 2023-11-13 | Discharge: 2023-11-13 | Disposition: A | Discharge status: Home / Self Care | Source: Ambulatory Visit | Attending: Hematology | Admitting: Hematology

## 2023-11-13 DIAGNOSIS — C61 Malignant neoplasm of prostate: Secondary | ICD-10-CM | POA: Diagnosis present

## 2023-11-13 MED ORDER — SODIUM CHLORIDE 0.9 % IV BOLUS: 1000.0000 mL | Freq: Once | INTRAVENOUS | Status: DC

## 2023-11-13 MED ORDER — SODIUM CHLORIDE 0.9 % IV BOLUS
1000.0000 mL | Freq: Once | INTRAVENOUS | Status: AC
  Administered 2023-11-13: 1000 mL via INTRAVENOUS

## 2023-11-13 MED ORDER — LUTETIUM LU 177 VIPIVOTIDE TET 1000 MBQ/ML IV SOLN
201.9960 | Freq: Once | INTRAVENOUS | Status: AC
  Administered 2023-11-13: 201.996 via INTRAVENOUS

## 2023-11-13 NOTE — Progress Notes (Signed)
 CLINICAL DATA: [73-year-old male with castrate resistant metastatic prostate carcinoma.]  EXAM: NUCLEAR MEDICINE PLUVICTO INJECTION  TECHNIQUE: Infusion: The nuclear medicine technologist and I personally verified the dose activity to be delivered as specified in the written directive, and verified the patient identification via 2 separate methods.  Initial flush of the intravenous catheter was performed was sterile saline. The dose syringe was connected to the catheter and the Lu-177 Pluvicto administered over a 1 to 10 min infusion. Single 10 cc  lushes with normal saline follow the dose. No complications were noted. The entire IV tubing, venocatheter, stopcock and syringes was removed in total, placed in a disposal bag and sent for assay of the residual activity, which will be reported at a later time in our EMR by the physics staff. Pressure was applied to the venipuncture site, and a compression bandage placed. Patient monitored for 1 hour following infusion.    Radiation Safety personnel were present to perform the discharge survey, as detailed on their documentation. After a short period of observation, the patient had his IV removed.  RADIOPHARMACEUTICALS: [201.9] microcuries Lu-177 PLUVICTO  FINDINGS: Current Infusion: [5]  Planned Infusions: 6    Patient presented to nuclear medicine for treatment. The patient's most recent blood counts were reviewed and remains a good candidate to proceed with Lu-177 Pluvicto.     No myelo suppression.  Patient reports treated pneumonia in interval with resolution.     Normal renal function.     PSA continues to decline equal 1.8         The patient was situated in an infusion suite with a contact barrier placed under the arm. Intravenous access was established, using sterile technique, and a normal saline infusion from a syringe was started.     Micro-dosimetry: The prescribed radiation activity was assayed and confirmed to  be within specified tolerance.  IMPRESSION: Current Infusion: [5]  Planned Infusions: 6    [The patient tolerated the infusion well. The patient will return in one month for ongoing care.]

## 2023-11-13 NOTE — Progress Notes (Signed)
 Pt. Tolerated his Plavicto treatment today well.

## 2023-11-15 ENCOUNTER — Inpatient Hospital Stay (HOSPITAL_COMMUNITY): Admission: RE | Admit: 2023-11-15 | Payer: Medicare Other | Source: Ambulatory Visit

## 2023-12-13 ENCOUNTER — Inpatient Hospital Stay: Attending: Hematology

## 2023-12-13 DIAGNOSIS — Z923 Personal history of irradiation: Secondary | ICD-10-CM | POA: Diagnosis not present

## 2023-12-13 DIAGNOSIS — Z8042 Family history of malignant neoplasm of prostate: Secondary | ICD-10-CM | POA: Insufficient documentation

## 2023-12-13 DIAGNOSIS — R530 Neoplastic (malignant) related fatigue: Secondary | ICD-10-CM | POA: Diagnosis not present

## 2023-12-13 DIAGNOSIS — Z1509 Genetic susceptibility to other malignant neoplasm: Secondary | ICD-10-CM | POA: Diagnosis not present

## 2023-12-13 DIAGNOSIS — I10 Essential (primary) hypertension: Secondary | ICD-10-CM | POA: Diagnosis not present

## 2023-12-13 DIAGNOSIS — Z79899 Other long term (current) drug therapy: Secondary | ICD-10-CM | POA: Insufficient documentation

## 2023-12-13 DIAGNOSIS — R110 Nausea: Secondary | ICD-10-CM | POA: Diagnosis not present

## 2023-12-13 DIAGNOSIS — C7951 Secondary malignant neoplasm of bone: Secondary | ICD-10-CM | POA: Insufficient documentation

## 2023-12-13 DIAGNOSIS — C61 Malignant neoplasm of prostate: Secondary | ICD-10-CM | POA: Diagnosis present

## 2023-12-13 LAB — CBC WITH DIFFERENTIAL/PLATELET
Abs Immature Granulocytes: 0 10*3/uL (ref 0.00–0.07)
Basophils Absolute: 0.1 10*3/uL (ref 0.0–0.1)
Basophils Relative: 1 %
Eosinophils Absolute: 0.2 10*3/uL (ref 0.0–0.5)
Eosinophils Relative: 4 %
HCT: 42.2 % (ref 39.0–52.0)
Hemoglobin: 13.9 g/dL (ref 13.0–17.0)
Immature Granulocytes: 0 %
Lymphocytes Relative: 21 %
Lymphs Abs: 0.9 10*3/uL (ref 0.7–4.0)
MCH: 32.2 pg (ref 26.0–34.0)
MCHC: 32.9 g/dL (ref 30.0–36.0)
MCV: 97.7 fL (ref 80.0–100.0)
Monocytes Absolute: 0.5 10*3/uL (ref 0.1–1.0)
Monocytes Relative: 11 %
Neutro Abs: 2.7 10*3/uL (ref 1.7–7.7)
Neutrophils Relative %: 63 %
Platelets: 219 10*3/uL (ref 150–400)
RBC: 4.32 MIL/uL (ref 4.22–5.81)
RDW: 14.4 % (ref 11.5–15.5)
WBC: 4.3 10*3/uL (ref 4.0–10.5)
nRBC: 0 % (ref 0.0–0.2)

## 2023-12-13 LAB — COMPREHENSIVE METABOLIC PANEL WITH GFR
ALT: 20 U/L (ref 0–44)
AST: 35 U/L (ref 15–41)
Albumin: 4.1 g/dL (ref 3.5–5.0)
Alkaline Phosphatase: 84 U/L (ref 38–126)
Anion gap: 7 (ref 5–15)
BUN: 19 mg/dL (ref 8–23)
CO2: 29 mmol/L (ref 22–32)
Calcium: 10 mg/dL (ref 8.9–10.3)
Chloride: 100 mmol/L (ref 98–111)
Creatinine, Ser: 1.12 mg/dL (ref 0.61–1.24)
GFR, Estimated: >60 mL/min (ref >60)
Glucose, Bld: 103 mg/dL — ABNORMAL HIGH (ref 70–99)
Potassium: 5.2 mmol/L — ABNORMAL HIGH (ref 3.5–5.1)
Sodium: 136 mmol/L (ref 135–145)
Total Bilirubin: 0.6 mg/dL (ref 0.0–1.2)
Total Protein: 7.3 g/dL (ref 6.5–8.1)

## 2023-12-13 LAB — MAGNESIUM: Magnesium: 2.4 mg/dL (ref 1.7–2.4)

## 2023-12-13 LAB — PSA: Prostatic Specific Antigen: 0.84 ng/mL (ref 0.00–4.00)

## 2023-12-20 ENCOUNTER — Inpatient Hospital Stay (HOSPITAL_BASED_OUTPATIENT_CLINIC_OR_DEPARTMENT_OTHER): Admitting: Hematology

## 2023-12-20 ENCOUNTER — Encounter: Payer: Self-pay | Admitting: Hematology

## 2023-12-20 VITALS — BP 120/75 | HR 64 | Temp 97.6°F | Resp 16 | Wt 134.7 lb

## 2023-12-20 DIAGNOSIS — C61 Malignant neoplasm of prostate: Secondary | ICD-10-CM | POA: Diagnosis not present

## 2023-12-20 NOTE — Progress Notes (Signed)
 [facility] [address] [place] [address]    Clinic Day:  12/20/23    Referring physician: Paulett Boros, MD  Patient Care Team: Patient, No Pcp Per as PCP - General (General Practice) Paulett Boros, MD as Consulting Physician (Oncology) Wilson, Diane G, RN as Oncology Nurse Navigator (Oncology)   ASSESSMENT & PLAN:   Assessment: 1.  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 -External beam radiation with 2 years of Lupron  from 05/27/2013 through 07/28/2013.  Received Lupron  until 09/28/2014. -Rising PSA levels in December 2018, of 2.8.  Local recurrence was confirmed with biopsy. -Bone scan on 04/12/2018 did not show any evidence of metastatic disease. -He was evaluated by Dr. Caretha Chapel at [facility].  Bone scan and CT scan at [facility] on 07/02/2018 did not show any evidence of metastatic disease.  Left para-aortic lymph node measures 8 mm.  Lupron  started for nonmetastatic HSPC in November 2019 for rapid PSA doubling time of less than 3 months.  He had difficulty tolerating Lupron  with irritability, low energy and fatigue. -PSA 5.7 (07/08/2019), PSA 11.1 (11/26/2019) with testosterone  18. -Fluciclovine PET CT scan on 12/23/2019 showed T11 meta stasis.  Asymmetric hypermetabolism within the prostate suspicious for residual disease.  Mild enlargement of 2 adjacent lower left para-aortic nodes, largest 8 mm with SUV of 3.4.  These nodes measured maximally 5 mm on PET scan on 02/25/2018. -Abiraterone  500 mg daily and prednisone  from 01/03/2020 through 02/16/2020, discontinued secondary to elevated LFTs. -We will consider checking germline and somatic mutation testing. -Lupron  45 mg on 02/17/2020 at Dr. Almira Jaeger office. -Enzalutamide  from 03/10/2020 through 10/17/2022 with progression. - Guardant360: MSI high not detected. NOTCH1 mutation. - Recommended cabazitaxel  to minimize  incidence of neuropathy as he is a Technical sales engineer and plays guitar. - Cycle 1 cabazitaxel  on 11/21/2022, cycle 8 on 04/17/2023, held due to poor tolerance including fatigue, mood changes. - Pluvicto  cycle 1 on 05/31/2023, cycle 2 on 07/12/2023   2.  Heterozygosity for MUTYH mutation: - Cologuard test was refused previously.    Plan: 1.  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  Last Eligard  45 mg was on 08/21/2023. - He reported muscular pains for the last couple of months predominantly in the upper arms, lateral chest wall bilaterally and upper back.  This is likely from Pluvicto .  Continue ibuprofen/Aleve at bedtime as needed. - Reviewed labs from 12/13/2023: Normal LFTs and creatinine.  CBC grossly normal.  PSA improved to 0.84 from 1.80 previously. - He will proceed with his final Pluvicto  # 6 on 01/03/2024. - RTC 8 weeks for follow-up with repeat PSA and testosterone .   2.  Bone metastasis: - Denosumab was previously discussed and refused.  Continue calcium and vitamin D supplements.   3.  Hypertension: - Continue losartan  25 mg daily.  Blood pressure is 120/78.   4.  Cancer-related fatigue: - Continue Ritalin  1/6th of a tablet as needed.  5.  Nausea: - Continue Zofran  4 mg every 8 hours for occasional nausea.    Orders Placed This Encounter  Procedures   Testosterone     Standing Status:   Future    Expected Date:   02/07/2024    Expiration Date:   12/19/2024   PSA    Standing Status:   Future    Expected Date:   02/07/2024  Expiration Date:   12/19/2024   CBC with Differential    Standing Status:   Future    Expected Date:   02/07/2024    Expiration Date:   12/19/2024   Comprehensive metabolic panel    Standing Status:   Future    Expected Date:   02/07/2024    Expiration Date:   12/19/2024      Nathan Smith,acting as a scribe for Paulett Boros, MD.,have documented all relevant documentation on the  behalf of Paulett Boros, MD,as directed by  Paulett Boros, MD while in the presence of Paulett Boros, MD.  I, Paulett Boros MD, have reviewed the above documentation for accuracy and completeness, and I agree with the above.      Paulett Boros, MD   5/15/20254:05 PM  CHIEF COMPLAINT:   Diagnosis: castration resistant prostate cancer    Cancer Staging  Prostate cancer [facility]) Staging form: Prostate, AJCC 7th Edition - Clinical stage from 12/26/2019: Stage IV (yTX, N1, M1b, PSA: 10 to 19, Gleason 8-10) - Signed by Paulett Boros, MD on 12/26/2019    Prior Therapy: 1. XRT 05/27/2013 - 07/28/2013  2. Lupron  03/2013 - 09/28/14 3. Abiraterone  12/24/19 - 02/16/20 4. enzalutamide  80 mg QD 03/10/20 - 10/17/22  Current Therapy:  cabazitaxel ; Lupron  every 6 months    HISTORY OF PRESENT ILLNESS:   Oncology History  Prostate cancer (HCC)  09/24/2012 Initial Diagnosis   Prostate cancer (HCC)   12/26/2019 Cancer Staging   Staging form: Prostate, AJCC 7th Edition - Clinical stage from 12/26/2019: Stage IV (yTX, N1, M1b, PSA: 10 to 19, Gleason 8-10) - Signed by Paulett Boros, MD on 12/26/2019   08/24/2020 Genetic Testing   Positive genetic testing:  A single, 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 Testing was completed through the Common Hereditary Cancers panel and Prostate Cancer HRR panel offered by [facility]. The report date is 08/24/2020.   The Common Hereditary Cancers Panel offered by [facility] includes sequencing and/or deletion duplication testing of the following 47 genes: APC, ATM, AXIN2, BARD1, BMPR1A, BRCA1, BRCA2, BRIP1, CDH1, CDK4, CDKN2A (p14ARF), CDKN2A (p16INK4a), CHEK2, CTNNA1, DICER1, EPCAM (Deletion/duplication testing only), GREM1 (promoter region deletion/duplication testing only), KIT, MEN1, MLH1, MSH2, MSH3, MSH6, MUTYH, NBN, NF1, NTHL1, PALB2, PDGFRA, PMS2, POLD1, POLE, PTEN, RAD50,  RAD51C, RAD51D, SDHB, SDHC, SDHD, SMAD4, SMARCA4. STK11, TP53, TSC1, TSC2, and VHL.  The following genes were evaluated for sequence changes only: SDHA and HOXB13 c.251G>A variant only. The Prostate Cancer HRR Panel offered by [facility] includes sequencing and/or deletion/duplication analysis of the following 10 genes: ATM, BARD1, BRCA1, BRCA2, BRIP1, CHEK2, FANCL, PALB2, RAD51C, and RAD51D.   11/21/2022 -  Chemotherapy   Patient is on Treatment Plan : PROSTATE Cabazitaxel  (20) D1 + Prednisone  D1-21 q21d        INTERVAL HISTORY:   Nathan Smith is a 73 y.o. male presenting to clinic today for follow up of castration resistant prostate cancer. He was last seen by me on 11/08/23.  Today, he states that he is doing well overall. His appetite level is at 75%. His energy level is at 75%.    PAST MEDICAL HISTORY:   Past Medical History: Past Medical History:  Diagnosis Date   Anxiety    Bilateral hydronephrosis    Bladder incontinence    night time,  past 2 months   BPH (benign prostatic hypertrophy) with urinary obstruction    Depression    ED (erectile dysfunction)    H/O ascites  trace   Hypertension    Port-A-Cath in place 11/13/2022   Prostate cancer (HCC) 09/24/2012   gleason 4+3=7., & 4+4=8,PSA=67.30, volume=33.7cc    Surgical History: Past Surgical History:  Procedure Laterality Date   IR IMAGING GUIDED PORT INSERTION  11/16/2022   PROSTATE BIOPSY  09/24/12   Adenocarcinoma   PROSTATE BIOPSY N/A 12/20/2017   Procedure: BIOPSY TRANSRECTAL ULTRASONIC PROSTATE (TUBP);  Surgeon: Trent Frizzle, MD;  Location: [facility];  Service: Urology;  Laterality: N/A;   TONSILLECTOMY      Social History: Social History   Socioeconomic History   Marital status: Single    Spouse name: Not on file   Number of children: 0   Years of education: Not on file   Highest education level: Not on file  Occupational History   Occupation: [name]    Employer: [employer]  Tobacco  Use   Smoking status: Never   Smokeless tobacco: Never  Vaping Use   Vaping status: Never Used  Substance and Sexual Activity   Alcohol use: Yes    Alcohol/week: 2.0 standard drinks of alcohol    Types: 2 Cans of beer per week    Comment: 3 per day, beer/wine , hx etoh abuse years ago   Drug use: No    Comment: hx   Sexual activity: Yes    Birth control/protection: Condom  Other Topics Concern   Not on file  Social History Narrative   Not on file   Social Drivers of Health   Financial Resource Strain: Low Risk  (12/08/2019)   Overall Financial Resource Strain (CARDIA)    Difficulty of Paying Living Expenses: Not very hard  Food Insecurity: No Food Insecurity (12/08/2019)   Hunger Vital Sign    Worried About Running Out of Food in the Last Year: Never true    Ran Out of Food in the Last Year: Never true  Transportation Needs: No Transportation Needs (12/08/2019)   PRAPARE - Administrator, Civil Service (Medical): No    Lack of Transportation (Non-Medical): No  Physical Activity: Sufficiently Active (12/08/2019)   Exercise Vital Sign    Days of Exercise per Week: 6 days    Minutes of Exercise per Session: 60 min  Stress: Stress Concern Present (12/08/2019)   [employer] of Occupational Health - Occupational Stress Questionnaire    Feeling of Stress : To some extent  Social Connections: Unknown (12/08/2019)   Social Connection and Isolation Panel [NHANES]    Frequency of Communication with Friends and Family: Twice a week    Frequency of Social Gatherings with Friends and Family: Once a week    Attends Religious Services: More than 4 times per year    Active Member of [employer] or Organizations: No    Attends Engineer, structural: More than 4 times per year    Marital Status: Not on file  Intimate Partner Violence: Not At Risk (12/08/2019)   Humiliation, Afraid, Rape, and Kick questionnaire    Fear of Current or Ex-Partner: No    Emotionally Abused: No     Physically Abused: No    Sexually Abused: No    Family History: Family History  Problem Relation Age of Onset   Depression Mother    Dementia Mother    Anxiety disorder Mother    Prostate cancer Father        unconfirmed diagnosis    Current Medications:  Current Outpatient Medications:    amoxicillin -clavulanate (AUGMENTIN ) 875-125 MG  tablet, Take 1 tablet by mouth every 12 (twelve) hours., Disp: 13 tablet, Rfl: 0   Ascorbic Acid (VITAMIN C) 1000 MG tablet, Take 1,000 mg by mouth daily., Disp: , Rfl:    azithromycin  (ZITHROMAX ) 250 MG tablet, Take 1 tablet (250 mg total) by mouth daily., Disp: 4 tablet, Rfl: 0   b complex vitamins capsule, Take 1 capsule by mouth daily., Disp: , Rfl:    Bacillus Coagulans-Inulin (PROBIOTIC) 1-250 BILLION-MG CAPS, Take by mouth daily. , Disp: , Rfl:    Cabazitaxel  (JEVTANA  IV), Inject into the vein every 21 ( twenty-one) days., Disp: , Rfl:    Cholecalciferol 125 MCG (5000 UT) capsule, Take 5,000 Units by mouth daily. , Disp: , Rfl:    Ginseng 100 MG CAPS, Take by mouth daily. , Disp: , Rfl:    L-ARGININE PO, Take 1,000 mg by mouth daily. , Disp: , Rfl:    lidocaine -prilocaine  (EMLA ) cream, Apply a small amount to port a cath site and cover with plastic wrap one hour prior to infusion appointments, Disp: 30 g, Rfl: 3   losartan  (COZAAR ) 25 MG tablet, TAKE 1 TABLET(25 MG) BY MOUTH DAILY, Disp: 90 tablet, Rfl: 3   melatonin 1 MG TABS tablet, Take 2 mg by mouth at bedtime. , Disp: , Rfl:    ondansetron  (ZOFRAN ) 4 MG tablet, Take 1 tablet (4 mg total) by mouth every 8 (eight) hours as needed for nausea or vomiting., Disp: 60 tablet, Rfl: 2   Probiotic Product (ALIGN) 4 MG CAPS, Take 1 capsule by mouth daily. , Disp: , Rfl:    tamsulosin  (FLOMAX ) 0.4 MG CAPS capsule, TAKE 1 CAPSULE(0.4 MG) BY MOUTH DAILY, Disp: 90 capsule, Rfl: 3   zinc gluconate 50 MG tablet, Take 50 mg by mouth daily., Disp: , Rfl:    methylphenidate  (RITALIN ) 5 MG tablet, Take 0.5  tablets (2.5 mg total) by mouth daily as needed. (Patient not taking: Reported on 12/20/2023), Disp: 10 tablet, Rfl: 0 No current facility-administered medications for this visit.  Facility-Administered Medications Ordered in Other Visits:    fulvestrant  (FASLODEX ) 250 MG/5ML injection, , , ,    Allergies: No Known Allergies  REVIEW OF SYSTEMS:   Review of Systems  Constitutional:  Positive for fatigue. Negative for chills and fever.  HENT:   Positive for trouble swallowing. Negative for lump/mass, mouth sores, nosebleeds and sore throat.   Eyes:  Negative for eye problems.  Respiratory:  Negative for cough and shortness of breath.   Cardiovascular:  Negative for chest pain, leg swelling and palpitations.  Gastrointestinal:  Positive for constipation. Negative for abdominal pain, diarrhea, nausea and vomiting.  Genitourinary:  Negative for bladder incontinence, difficulty urinating, dysuria, frequency, hematuria and nocturia.   Musculoskeletal:  Negative for arthralgias, back pain, flank pain, myalgias and neck pain.  Skin:  Negative for itching and rash.  Neurological:  Positive for numbness. Negative for dizziness and headaches.  Hematological:  Does not bruise/bleed easily.  Psychiatric/Behavioral:  Positive for depression. Negative for sleep disturbance and suicidal ideas. The patient is nervous/anxious.   All other systems reviewed and are negative.    VITALS:   Blood pressure 120/75, pulse 64, temperature 97.6 F (36.4 C), temperature source Oral, resp. rate 16, weight 134 lb 11.2 oz (61.1 kg), SpO2 99%.  Wt Readings from Last 3 Encounters:  12/20/23 134 lb 11.2 oz (61.1 kg)  11/08/23 133 lb 13.1 oz (60.7 kg)  10/11/23 137 lb (62.1 kg)    Body mass index is  18.27 kg/m.  Performance status (ECOG): 1 - Symptomatic but completely ambulatory  PHYSICAL EXAM:   Physical Exam Vitals and nursing note reviewed. Exam conducted with a chaperone present.  Constitutional:       Appearance: Normal appearance.  Cardiovascular:     Rate and Rhythm: Normal rate and regular rhythm.     Pulses: Normal pulses.     Heart sounds: Normal heart sounds.  Pulmonary:     Effort: Pulmonary effort is normal.     Breath sounds: Normal breath sounds.  Abdominal:     Palpations: Abdomen is soft. There is no hepatomegaly, splenomegaly or mass.     Tenderness: There is no abdominal tenderness.  Musculoskeletal:     Right lower leg: No edema.     Left lower leg: No edema.  Lymphadenopathy:     Cervical: No cervical adenopathy.     Right cervical: No superficial, deep or posterior cervical adenopathy.    Left cervical: No superficial, deep or posterior cervical adenopathy.     Upper Body:     Right upper body: No supraclavicular or axillary adenopathy.     Left upper body: No supraclavicular or axillary adenopathy.  Neurological:     General: No focal deficit present.     Mental Status: He is alert and oriented to person, place, and time.  Psychiatric:        Mood and Affect: Mood normal.        Behavior: Behavior normal.     LABS:      Latest Ref Rng & Units 12/13/2023    2:07 PM 11/05/2023    2:00 PM 10/11/2023    3:41 PM  CBC  WBC 4.0 - 10.5 K/uL 4.3  4.3  11.5   Hemoglobin 13.0 - 17.0 g/dL 16.1  09.6  04.5   Hematocrit 39.0 - 52.0 % 42.2  43.4  44.4   Platelets 150 - 400 K/uL 219  244  387       Latest Ref Rng & Units 12/13/2023    2:07 PM 11/05/2023    2:00 PM 10/11/2023    3:41 PM  CMP  Glucose 70 - 99 mg/dL 409  97  811   BUN 8 - 23 mg/dL 19  16  14    Creatinine 0.61 - 1.24 mg/dL 9.14  7.82  9.56   Sodium 135 - 145 mmol/L 136  137  134   Potassium 3.5 - 5.1 mmol/L 5.2  4.0  4.0   Chloride 98 - 111 mmol/L 100  101  97   CO2 22 - 32 mmol/L 29  29  25    Calcium 8.9 - 10.3 mg/dL 21.3  9.5  9.3   Total Protein 6.5 - 8.1 g/dL 7.3  6.9  7.0   Total Bilirubin 0.0 - 1.2 mg/dL 0.6  0.8  0.8   Alkaline Phos 38 - 126 U/L 84  60  67   AST 15 - 41 U/L 35  27  21   ALT  0 - 44 U/L 20  18  17       No results found for: "CEA1", "CEA" / No results found for: "CEA1", "CEA" Lab Results  Component Value Date   PSA1 0.3 08/30/2021   No results found for: "YQM578" No results found for: "CAN125"  No results found for: "TOTALPROTELP", "ALBUMINELP", "A1GS", "A2GS", "BETS", "BETA2SER", "GAMS", "MSPIKE", "SPEI" No results found for: "TIBC", "FERRITIN", "IRONPCTSAT" Lab Results  Component Value Date   LDH 129 03/22/2020  STUDIES:   No results found.

## 2023-12-20 NOTE — Patient Instructions (Signed)
 [facility] at [facility] Discharge Instructions   You were seen and examined today by Dr. Cheree Cords.  He reviewed the results of your lab work which are normal/stable. Your PSA is coming down nicely.   We will .   Return as scheduled.    Thank you for choosing [facility] at [facility] to provide your oncology and hematology care.  To afford each patient quality time with our provider, please arrive at least 15 minutes before your scheduled appointment time.   If you have a lab appointment with the Cancer Center please come in thru the Main Entrance and check in at the main information desk.  You need to re-schedule your appointment should you arrive 10 or more minutes late.  We strive to give you quality time with our providers, and arriving late affects you and other patients whose appointments are after yours.  Also, if you no show three or more times for appointments you may be dismissed from the clinic at the providers discretion.     Again, thank you for choosing [facility].  Our hope is that these requests will decrease the amount of time that you wait before being seen by our physicians.       _____________________________________________________________  Should you have questions after your visit to [facility], please contact our office at [phone] and follow the prompts.  Our office hours are 8:00 a.m. and 4:30 p.m. Monday - Friday.  Please note that voicemails left after 4:00 p.m. may not be returned until the following business day.  We are closed weekends and major holidays.  You do have access to a nurse 24-7, just call the main number to the clinic [phone] and do not press any options, hold on the line and a nurse will answer the phone.    For prescription refill requests, have your pharmacy contact our office and allow 72 hours.    Due to Covid, you will need to wear a mask upon entering  the hospital. If you do not have a mask, a mask will be given to you at the Main Entrance upon arrival. For doctor visits, patients may have 1 support person age 20 or older with them. For treatment visits, patients can not have anyone with them due to social distancing guidelines and our immunocompromised population.

## 2023-12-21 ENCOUNTER — Other Ambulatory Visit: Payer: Self-pay

## 2023-12-24 ENCOUNTER — Other Ambulatory Visit: Payer: Self-pay

## 2023-12-27 ENCOUNTER — Other Ambulatory Visit (HOSPITAL_COMMUNITY): Payer: Medicare Other

## 2024-01-02 NOTE — Written Directive (Cosign Needed)
  PLUVICTO   THERAPY   RADIOPHARMACEUTICAL: Lutetium 177 vipivotide tetraxetan (Pluvicto )     PRESCRIBED DOSE FOR ADMINISTRATION:  200 mCi   ROUTE OFADMINISTRATION:  IV   DIAGNOSIS: prostate cancer, Prostate cancer (HCC)   REFERRING PHYSICIAN: Paulett Boros, MD   TREATMENT #: 6   ADDITIONAL PHYSICIAN COMMENTS/NOTES:   AUTHORIZED USER SIGNATURE & TIME STAMP: Reino Carbo, MD   01/03/24    9:29 AM

## 2024-01-02 NOTE — Written Directive (Cosign Needed)
  PLUVICTO   THERAPY   RADIOPHARMACEUTICAL: Lutetium 177 vipivotide tetraxetan (Pluvicto )     PRESCRIBED DOSE FOR ADMINISTRATION:  200 mCi   ROUTE OFADMINISTRATION:  IV   DIAGNOSIS:  Prostate Cancer    REFERRING PHYSICIAN: Dr. Cheree Cords    TREATMENT #: 6    ADDITIONAL PHYSICIAN COMMENTS/NOTES:   AUTHORIZED USER SIGNATURE & TIME STAMP: Reino Carbo, MD   01/03/24    3:37 PM

## 2024-01-03 ENCOUNTER — Ambulatory Visit (HOSPITAL_COMMUNITY)
Admission: RE | Admit: 2024-01-03 | Discharge: 2024-01-03 | Disposition: A | Discharge status: Home / Self Care | Payer: Medicare Other | Source: Ambulatory Visit | Attending: Hematology | Admitting: Hematology

## 2024-01-03 DIAGNOSIS — C61 Malignant neoplasm of prostate: Secondary | ICD-10-CM | POA: Insufficient documentation

## 2024-01-03 MED ORDER — LUTETIUM LU 177 VIPIVOTIDE TET 1000 MBQ/ML IV SOLN
203.9700 | Freq: Once | INTRAVENOUS | Status: AC
  Administered 2024-01-03: 203.97 via INTRAVENOUS

## 2024-01-03 MED ORDER — SODIUM CHLORIDE 0.9 % IV BOLUS
1000.0000 mL | Freq: Once | INTRAVENOUS | Status: AC
  Administered 2024-01-03: 1000 mL via INTRAVENOUS

## 2024-01-03 MED ORDER — SODIUM CHLORIDE 0.9 % IV BOLUS: 1000.0000 mL | Freq: Once | INTRAVENOUS | Status: DC

## 2024-01-03 NOTE — Progress Notes (Signed)
 CLINICAL DATA: [Prostate cancer.  Castrate resistant prostate cancer with skeletal metastasis.]  EXAM: NUCLEAR MEDICINE PLUVICTO  INJECTION  TECHNIQUE: Infusion: The nuclear medicine technologist and I personally verified the dose activity to be delivered as specified in the written directive, and verified the patient identification via 2 separate methods.  Initial flush of the intravenous catheter was performed was sterile saline. The dose syringe was connected to the catheter and the Lu-177 Pluvicto  administered over a 1 to 10 min infusion. Single 10 cc  lushes with normal saline follow the dose. No complications were noted. The entire IV tubing, venocatheter, stopcock and syringes was removed in total, placed in a disposal bag and sent for assay of the residual activity, which will be reported at a later time in our EMR by the physics staff. Pressure was applied to the venipuncture site, and a compression bandage placed. Patient monitored for 1 hour following infusion.    Radiation Safety personnel were present to perform the discharge survey, as detailed on their documentation. After a short period of observation, the patient had his IV removed.  RADIOPHARMACEUTICALS: [203.9] microcuries Lu-177 PLUVICTO   FINDINGS: Current Infusion: [6]  Planned Infusions: 6    Patient presented to nuclear medicine for treatment. The patient's most recent blood counts were reviewed and remains a good candidate to proceed with Lu-177 Pluvicto .     PSA continues to decline and is now less than 1.     Patient reports new fatigue and peripheral neuropathy with numbness and tingling in the arms and hands.  Weakness in the legs.     The patient was situated in an infusion suite with a contact barrier placed under the arm. Intravenous access was established, using sterile technique, and a normal saline infusion from a syringe was started.     Micro-dosimetry: The prescribed radiation activity was  assayed and confirmed to be within specified tolerance.  IMPRESSION: Current Infusion: [6]  Planned Infusions: 6    [The patient tolerated the infusion well. The patient will return in 6 to  8 weeks for PSMA PET scan.

## 2024-01-03 NOTE — Progress Notes (Signed)
 Pt. Tolerated last Plavicto treatment well.

## 2024-01-07 ENCOUNTER — Other Ambulatory Visit: Payer: Self-pay | Admitting: Hematology

## 2024-01-07 DIAGNOSIS — T451X5A Adverse effect of antineoplastic and immunosuppressive drugs, initial encounter: Secondary | ICD-10-CM | POA: Insufficient documentation

## 2024-01-07 MED ORDER — GABAPENTIN 100 MG PO CAPS
100.0000 mg | ORAL_CAPSULE | Freq: Two times a day (BID) | ORAL | 1 refills | Status: DC
Start: 2024-01-07 — End: 2024-02-21

## 2024-01-07 NOTE — Telephone Encounter (Signed)
 Please advise.  He has completed Pluvicto  treatments.

## 2024-01-10 ENCOUNTER — Encounter: Payer: Self-pay | Admitting: Neurology

## 2024-01-10 NOTE — Telephone Encounter (Signed)
**Note De-identified  [place] Obfuscation** Please advise 

## 2024-01-14 ENCOUNTER — Other Ambulatory Visit: Payer: Self-pay | Admitting: *Deleted

## 2024-01-14 DIAGNOSIS — C61 Malignant neoplasm of prostate: Secondary | ICD-10-CM

## 2024-01-14 NOTE — Telephone Encounter (Signed)
 He does not have an appt with neurologist until August.  Anything you would suggest in the meantime?

## 2024-01-14 NOTE — Telephone Encounter (Signed)
 Pain has worsened in back and arms and has lost strength in left hand.

## 2024-01-18 ENCOUNTER — Ambulatory Visit (HOSPITAL_COMMUNITY)
Admission: RE | Admit: 2024-01-18 | Discharge: 2024-01-18 | Disposition: A | Discharge status: Home / Self Care | Source: Ambulatory Visit | Attending: Hematology | Admitting: Hematology

## 2024-01-18 ENCOUNTER — Other Ambulatory Visit: Payer: Self-pay

## 2024-01-18 DIAGNOSIS — C61 Malignant neoplasm of prostate: Secondary | ICD-10-CM | POA: Insufficient documentation

## 2024-01-18 MED ORDER — GADOBUTROL 1 MMOL/ML IV SOLN
6.0000 mL | Freq: Once | INTRAVENOUS | Status: AC | PRN
  Administered 2024-01-18: 6 mL via INTRAVENOUS

## 2024-01-21 ENCOUNTER — Other Ambulatory Visit: Payer: Self-pay | Admitting: Hematology

## 2024-01-21 ENCOUNTER — Inpatient Hospital Stay: Attending: Hematology | Admitting: Hematology

## 2024-01-21 ENCOUNTER — Other Ambulatory Visit: Payer: Self-pay | Admitting: *Deleted

## 2024-01-21 DIAGNOSIS — C61 Malignant neoplasm of prostate: Secondary | ICD-10-CM

## 2024-01-21 MED ORDER — DEXAMETHASONE 2 MG PO TABS: 2.0000 mg | ORAL_TABLET | Freq: Two times a day (BID) | ORAL | 0 refills | Status: DC

## 2024-01-21 MED ORDER — HYDROCODONE-ACETAMINOPHEN 5-325 MG PO TABS
1.0000 | ORAL_TABLET | Freq: Four times a day (QID) | ORAL | 0 refills | Status: DC | PRN
Start: 2024-01-21 — End: 2024-02-21

## 2024-01-21 NOTE — Progress Notes (Signed)
 Virtual Visit via Telephone Note  I connected with Nathan Smith on 01/21/24 at  3:00 PM EDT by telephone and verified that I am speaking with the correct person using two identifiers.  Location: Patient: At home Provider: In the office   I discussed the limitations, risks, security and privacy concerns of performing an evaluation and management service by telephone and the availability of in person appointments. I also discussed with the patient that there may be a patient responsible charge related to this service. The patient expressed understanding and agreed to proceed.   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  Most recently he received cabazitaxel  from 11/11/2022 through 04/17/2023, discontinued for poor tolerance, fatigue and mood changes.  He was referred to Dr. Marla Sills for Pluvicto , which he tolerated very well and completed 6 cycles.   Observations/Objective: He complained of pain in the left arm, hand, upper left back which has come on suddenly 3 weeks ago and had gradually gotten worse.  Assessment and Plan:  1.  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 - He reported tingling/numbness in the left arm and hand which started about 3 weeks ago sometimes associated with pain.  He gradually developed weakness in the left hand and could not open medication bottles.  He also developed pain in the left upper back.  He called our office last week when we have arranged MRI of the cervical and thoracic spine with and without contrast. - He was taking gabapentin  100 mg twice daily for the last 2 weeks. 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  Epidural tumor results in mild left-sided spinal stenosis and effacement of  the left neural foramen. - I have recommended that he be evaluated in the ER immediately to start radiation quickly.  He does not want to go through the ER.  We have reached out to our radiation oncology colleagues.  He has an appointment and simulation on Thursday. - I have recommended follow-up with me on 02/14/2024 with repeat labs including PSA and a PSMA PET CT scan for restaging. - In the interim, I have started him on dexamethasone  2 mg twice daily to titrate up as tolerated.  He cannot take high-dose steroids due to irritability.  I have also given prescription for hydrocodone  5/325 mg every 6 hours as needed.  He took both of the pills an hour ago and is feeling slightly better.  He will call our nurse tomorrow if he has any problems.   Follow Up Instructions: PSMA PET scan, PSA, CBC and CMP prior to his visit on 02/14/2024   I discussed the assessment and treatment plan with the patient. The patient was provided an opportunity to ask questions and all were answered. The patient agreed with the plan and demonstrated an understanding of the instructions.   The patient was advised to call back or seek an in-person evaluation if the symptoms worsen or if the condition fails to improve as anticipated.  I provided 40 minutes of non-face-to-face time during this encounter.   Paulett Boros, MD

## 2024-01-22 ENCOUNTER — Other Ambulatory Visit: Payer: Self-pay | Admitting: *Deleted

## 2024-01-22 DIAGNOSIS — C61 Malignant neoplasm of prostate: Secondary | ICD-10-CM

## 2024-01-22 NOTE — Progress Notes (Signed)
 Histology and Location of Primary Cancer: T1 Epidural  Patient presented as referral from Dr. Alean Stands [facility]) for urgent radiation treatment of T1 epidural lesion.  Sites of Visceral and Bony Metastatic Disease: T1  Location(s) of Symptomatic Metastases: T1  01/18/2024 Dr. Alean Stands MR Cervical Spine with/without Contrast CLINICAL DATA:  Severe back pain. History of metastatic prostate cancer.  IMPRESSION: 1. Widespread osseous metastatic disease throughout the cervical and thoracic spine. 2. 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 No cord edema. 3. No definite acute pathologic fracture. 4. 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 5. Moderate to severe neural foraminal stenosis at C4-5 and C5-6.  01/18/2024 Dr. Alean Stands MR Thoracic Spine CLINICAL DATA:  Severe back pain. History of metastatic prostate cancer.  IMPRESSION: 1. Widespread osseous metastatic disease throughout the cervical and thoracic spine. 2. 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 No cord edema. 3. No definite acute pathologic fracture. 4. 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 5. Moderate to severe neural foraminal stenosis at C4-5 and C5-6.  Past/Anticipated chemotherapy by medical oncology, if any:  Dr. Sreedhar Katragadda    If Spine Met(s), symptoms, if any, include: Bowel/Bladder retention or incontinence (please describe): Constipation Numbness or weakness in extremities (please describe):  Yes,  left arm painful numbness hand up to shoulder Current Decadron  regimen, if applicable:  Decadron  2 mg BID Pain on a scale of 0-10 is:  9/10 takes hydrocodone  but not helpful at night per patient has reached up to Dr.  MARLA.  Ambulatory status? Walker? Wheelchair?:  Independent   SAFETY ISSUES: Prior radiation?  Yes, Pacemaker/ICD?  No Possible current pregnancy? Male Is the patient on methotrexate? No  Current Complaints / other details:  Port-a-cath

## 2024-01-23 NOTE — Progress Notes (Signed)
  Radiation Oncology         [phone] ________________________________  Name: Nathan Smith MRN: [id number]  Date: 01/24/2024  DOB: 1950/09/25  SIMULATION AND TREATMENT PLANNING NOTE    ICD-10-CM   1. Malignant neoplasm of prostate metastatic to bone (HCC)  C61    C79.51     2. 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  NARRATIVE:  The patient was brought to the CT Simulation planning suite.  Identity was confirmed.  All relevant records and images related to the planned course of therapy were reviewed.  The patient freely provided informed written consent to proceed with treatment after reviewing the details related to the planned course of therapy. The consent form was witnessed and verified by the simulation staff.  Then, the patient was set-up in a stable reproducible  supine position for radiation therapy.  CT images were obtained.  Surface markings were placed.  The CT images were loaded into the planning software.  Then the target and avoidance structures were contoured.  Treatment planning then occurred.  The radiation prescription was entered and confirmed.  Then, I designed and supervised the construction of a total of 3 medically necessary complex treatment devices consisting of leg positioner and MLC apertures to cover the treated spinal area.  I have requested : 3D Simulation  I have requested a DVH of the following structures:spinal cord, esophagus and target.  PLAN:  The target at T1 will be treated to 30 Gy in 10 fractions.  ________________________________  Trilby Fujisawa Lorri Rota, M.D.

## 2024-01-24 ENCOUNTER — Other Ambulatory Visit: Payer: Self-pay | Admitting: Hematology

## 2024-01-24 ENCOUNTER — Encounter: Payer: Self-pay | Admitting: Hematology

## 2024-01-24 ENCOUNTER — Ambulatory Visit
Admission: RE | Admit: 2024-01-24 | Discharge: 2024-01-24 | Disposition: A | Discharge status: Home / Self Care | Source: Ambulatory Visit | Attending: Radiation Oncology | Admitting: Radiation Oncology

## 2024-01-24 ENCOUNTER — Other Ambulatory Visit (HOSPITAL_COMMUNITY): Payer: Self-pay

## 2024-01-24 ENCOUNTER — Encounter: Payer: Self-pay | Admitting: Radiation Oncology

## 2024-01-24 ENCOUNTER — Encounter (HOSPITAL_COMMUNITY): Payer: Self-pay | Admitting: Hematology

## 2024-01-24 ENCOUNTER — Other Ambulatory Visit: Payer: Self-pay

## 2024-01-24 VITALS — BP 152/89 | HR 68 | Temp 97.9°F | Resp 20 | Ht 72.0 in | Wt 137.6 lb

## 2024-01-24 DIAGNOSIS — Z79899 Other long term (current) drug therapy: Secondary | ICD-10-CM | POA: Insufficient documentation

## 2024-01-24 DIAGNOSIS — M47812 Spondylosis without myelopathy or radiculopathy, cervical region: Secondary | ICD-10-CM | POA: Diagnosis not present

## 2024-01-24 DIAGNOSIS — N1330 Unspecified hydronephrosis: Secondary | ICD-10-CM | POA: Diagnosis not present

## 2024-01-24 DIAGNOSIS — Z191 Hormone sensitive malignancy status: Secondary | ICD-10-CM | POA: Diagnosis not present

## 2024-01-24 DIAGNOSIS — G893 Neoplasm related pain (acute) (chronic): Secondary | ICD-10-CM | POA: Diagnosis not present

## 2024-01-24 DIAGNOSIS — M4802 Spinal stenosis, cervical region: Secondary | ICD-10-CM | POA: Diagnosis not present

## 2024-01-24 DIAGNOSIS — Z510 Encounter for antineoplastic radiation therapy: Secondary | ICD-10-CM | POA: Insufficient documentation

## 2024-01-24 DIAGNOSIS — R202 Paresthesia of skin: Secondary | ICD-10-CM | POA: Insufficient documentation

## 2024-01-24 DIAGNOSIS — I10 Essential (primary) hypertension: Secondary | ICD-10-CM | POA: Diagnosis not present

## 2024-01-24 DIAGNOSIS — G629 Polyneuropathy, unspecified: Secondary | ICD-10-CM | POA: Diagnosis not present

## 2024-01-24 DIAGNOSIS — R200 Anesthesia of skin: Secondary | ICD-10-CM | POA: Insufficient documentation

## 2024-01-24 DIAGNOSIS — C61 Malignant neoplasm of prostate: Secondary | ICD-10-CM | POA: Diagnosis not present

## 2024-01-24 DIAGNOSIS — R32 Unspecified urinary incontinence: Secondary | ICD-10-CM | POA: Insufficient documentation

## 2024-01-24 DIAGNOSIS — N529 Male erectile dysfunction, unspecified: Secondary | ICD-10-CM | POA: Insufficient documentation

## 2024-01-24 DIAGNOSIS — Z923 Personal history of irradiation: Secondary | ICD-10-CM | POA: Insufficient documentation

## 2024-01-24 DIAGNOSIS — R5383 Other fatigue: Secondary | ICD-10-CM | POA: Insufficient documentation

## 2024-01-24 DIAGNOSIS — C7951 Secondary malignant neoplasm of bone: Secondary | ICD-10-CM | POA: Insufficient documentation

## 2024-01-24 MED ORDER — OXYCODONE-ACETAMINOPHEN 10-325 MG PO TABS: 1.0000 | ORAL_TABLET | Freq: Every evening | ORAL | 0 refills | Status: DC | PRN

## 2024-01-24 MED ORDER — HYDROMORPHONE HCL 4 MG PO TABS
4.0000 mg | ORAL_TABLET | Freq: Four times a day (QID) | ORAL | 0 refills | Status: DC | PRN
  Filled 2024-01-24: qty 28, 7d supply, fill #0
  Filled 2024-01-24: qty 30, 8d supply, fill #0

## 2024-01-24 NOTE — Progress Notes (Signed)
 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    ICD-10-CM   1. Malignant neoplasm of prostate metastatic to bone (HCC)  C61    C79.51     2. Prostate cancer [facility])  C61       HISTORY OF PRESENT ILLNESS: Nathan Smith is a 73 y.o. male seen at the request of Dr. Cheree Cords.  He was initially diagnosed with Gleason 4+4 adenocarcinoma of the prostate with PSA of 78 in 2014.  He elected treatment with LT-ADT concurrent with 8 weeks of daily IMRT. The IMRT was completed in 07/2013 and he completed the ADT in February 2016.  He had a PSA recurrence in 2018 and local recurrence was confirmed on biopsy.  Restaging CT and bone scans were negative so the patient met with Dr. Caretha Chapel at [facility] and elected to resume ADT with Lupron  in November 2019.  He developed bony metastatic disease at T11 in May 2021 so abiraterone  was added on 01/03/2020, by Dr. Cheree Cords, but discontinued on 02/16/2020 due to elevated LFTs.  He was switched to enzalutamide  in August 2021 and this was discontinued in March 2024 secondary to disease progression noted on disease restaging PSMA PET scan from 10/26/2022.  He was started on cabazitaxel  in April 2024 and completed 8 cycles in September 2024 before stopping due to poor tolerance.  He was subsequently treated with Pluvicto  and completed 6 of 6 infusions as of 01/03/2024 with an excellent response in the PSA which had decreased to 0.8 4 on his most recent labs from 12/13/2023.  He developed numbness and tingling in the left arm approximately 3 weeks ago and gradually developed weakness in the left hand and  pain in the upper back.  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 MRI scans of the cervical and thoracic spine were performed on 01/18/2024 and showed new widespread metastatic disease involving all of the cervical and thoracic vertebra with epidural tumor at C7-T1 level with mild cord mass effect but no evidence of cord compression.  He was started on Decadron  2mg  po BID on 01/21/24. His pain has been poorly controlled with hydrocodone  so he has been kindly referred to us  today to discuss the potential role for palliative radiotherapy in the management of painful bony metastatic disease.  PREVIOUS RADIATION THERAPY: Yes- 05/27/2013 through 07/28/2013:  1.  The patient's prostate, seminal vesicles, and pelvic lymph nodes were initially treated to 45 gray in 25 fractions of 1.8 gray  2.  the patient's prostate alone was boosted to 75 gray with 15 additional fractions of 2 gray  PAST MEDICAL HISTORY:  Past Medical History:  Diagnosis Date   Anxiety    Bilateral hydronephrosis    Bladder incontinence    night time,  past 2 months   BPH (benign prostatic hypertrophy) with urinary obstruction    Depression    ED (erectile dysfunction)    H/O ascites    trace   Hypertension    Port-A-Cath in place 11/13/2022   Prostate cancer (HCC) 09/24/2012   gleason 4+3=7., & 4+4=8,PSA=67.30, volume=33.7cc      PAST SURGICAL HISTORY:  Past Surgical History:  Procedure Laterality Date   IR IMAGING GUIDED PORT INSERTION  11/16/2022   PROSTATE BIOPSY  09/24/12   Adenocarcinoma   PROSTATE BIOPSY N/A 12/20/2017   Procedure: BIOPSY TRANSRECTAL ULTRASONIC PROSTATE (TUBP);  Surgeon: Trent Frizzle, MD;  Location: [facility];  Service: Urology;  Laterality: N/A;   TONSILLECTOMY      FAMILY HISTORY:  Family History  Problem Relation Age of Onset   Depression Mother    Dementia Mother    Anxiety disorder Mother    Prostate cancer Father         unconfirmed diagnosis    SOCIAL HISTORY:  Social History   Socioeconomic History   Marital status: Single    Spouse name: Not on file   Number of children: 0   Years of education: Not on file   Highest education level: Not on file  Occupational History   Occupation: [name]    Employer: [employer]  Tobacco Use   Smoking status: Never   Smokeless tobacco: Never  Vaping Use   Vaping status: Never Used  Substance and Sexual Activity   Alcohol use: Not Currently    Alcohol/week: 2.0 standard drinks of alcohol    Types: 2 Cans of beer per week    Comment: March 5 last time had drink   Drug use: No    Comment: hx   Sexual activity: Yes    Birth control/protection: Condom  Other Topics Concern   Not on file  Social History Narrative   Not on file   Social Drivers of Health   Financial Resource Strain: Low Risk  (12/08/2019)   Overall Financial Resource Strain (CARDIA)    Difficulty of Paying Living Expenses: Not very hard  Food Insecurity: No Food Insecurity (01/24/2024)   Hunger Vital Sign    Worried About Running Out of Food in the Last Year: Never true    Ran Out of Food in the Last Year: Never true  Transportation Needs: No Transportation Needs (01/24/2024)   PRAPARE - Administrator, Civil Service (Medical): No    Lack of Transportation (Non-Medical): No  Physical Activity: Sufficiently Active (12/08/2019)   Exercise Vital Sign    Days of Exercise per Week: 6 days    Minutes of Exercise per Session: 60 min  Stress: Stress Concern Present (12/08/2019)   [employer] of Occupational Health - Occupational Stress Questionnaire    Feeling of Stress : To some extent  Social Connections: Unknown (12/08/2019)   Social Connection and Isolation Panel    Frequency of Communication with Friends and Family: Twice a week    Frequency of Social Gatherings with Friends and Family: Once a week    Attends Religious Services: More than 4 times per year     Active Member of [employer] or Organizations: No    Attends Engineer, structural: More than 4 times per year    Marital Status: Not on file  Intimate Partner Violence: Not At Risk (01/24/2024)   Humiliation, Afraid, Rape, and Kick questionnaire    Fear of Current or Ex-Partner: No    Emotionally Abused: No    Physically Abused: No    Sexually Abused: No    ALLERGIES: Patient has no known allergies.  MEDICATIONS:  Current Outpatient Medications  Medication Sig Dispense Refill   HYDROmorphone  (DILAUDID ) 4 MG tablet Take 1 tablet (4 mg total) by mouth every 6 (six) hours as needed for severe pain (pain score 7-10).  30 tablet 0   amoxicillin -clavulanate (AUGMENTIN ) 875-125 MG tablet Take 1 tablet by mouth every 12 (twelve) hours. 13 tablet 0   Ascorbic Acid (VITAMIN C) 1000 MG tablet Take 1,000 mg by mouth daily.     azithromycin  (ZITHROMAX ) 250 MG tablet Take 1 tablet (250 mg total) by mouth daily. 4 tablet 0   b complex vitamins capsule Take 1 capsule by mouth daily.     Bacillus Coagulans-Inulin (PROBIOTIC) 1-250 BILLION-MG CAPS Take by mouth daily.      Cabazitaxel  (JEVTANA  IV) Inject into the vein every 21 ( twenty-one) days.     Cholecalciferol 125 MCG (5000 UT) capsule Take 5,000 Units by mouth daily.      dexamethasone  (DECADRON ) 2 MG tablet Take 1 tablet (2 mg total) by mouth 2 (two) times daily with a meal. 60 tablet 0   gabapentin  (NEURONTIN ) 100 MG capsule Take 1 capsule (100 mg total) by mouth 2 (two) times daily. 60 capsule 1   Ginseng 100 MG CAPS Take by mouth daily.      HYDROcodone -acetaminophen  (NORCO/VICODIN) 5-325 MG tablet Take 1 tablet by mouth every 6 (six) hours as needed for moderate pain (pain score 4-6). 60 tablet 0   L-ARGININE PO Take 1,000 mg by mouth daily.      lidocaine -prilocaine  (EMLA ) cream Apply a small amount to port a cath site and cover with plastic wrap one hour prior to infusion appointments 30 g 3   losartan  (COZAAR ) 25 MG tablet TAKE 1  TABLET(25 MG) BY MOUTH DAILY 90 tablet 3   melatonin 1 MG TABS tablet Take 2 mg by mouth at bedtime.      methylphenidate  (RITALIN ) 5 MG tablet Take 0.5 tablets (2.5 mg total) by mouth daily as needed. 10 tablet 0   ondansetron  (ZOFRAN ) 4 MG tablet Take 1 tablet (4 mg total) by mouth every 8 (eight) hours as needed for nausea or vomiting. 60 tablet 2   Probiotic Product (ALIGN) 4 MG CAPS Take 1 capsule by mouth daily.      tamsulosin  (FLOMAX ) 0.4 MG CAPS capsule TAKE 1 CAPSULE(0.4 MG) BY MOUTH DAILY 90 capsule 3   zinc gluconate 50 MG tablet Take 50 mg by mouth daily.     No current facility-administered medications for this encounter.   Facility-Administered Medications Ordered in Other Encounters  Medication Dose Route Frequency Provider Last Rate Last Admin   fulvestrant  (FASLODEX ) 250 MG/5ML injection             REVIEW OF SYSTEMS:  On review of systems, the patient reports that he is doing fair overall. He continues with numbness and tingling in the left arm with weakness in the left hand and pain in the upper back. 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  He is unable to sleep at night due to the pain which is poorly controlled with hydrocodone . He denies any chest pain, shortness of breath, cough, fevers, chills, night sweats, or unintended weight changes.  He denies any bowel or bladder disturbances, and denies abdominal pain, nausea or vomiting.  He denies any new musculoskeletal or joint aches or pains. A complete review of systems is obtained and is otherwise negative.    PHYSICAL EXAM:  Wt Readings from Last 3 Encounters:  01/24/24 137 lb 9.6 oz (62.4 kg)  12/20/23 134 lb 11.2 oz (61.1 kg)  11/08/23 133 lb 13.1 oz (60.7 kg)   Temp Readings from Last 3 Encounters:  01/24/24 97.9 F (36.6 C)  12/20/23 97.6 F (36.4 C) (Oral)  11/08/23 (!) 97.4 F (36.3 C) (Oral)   BP Readings from Last 3 Encounters:  01/24/24 (!)  152/89  01/03/24 121/84  12/20/23 120/75   Pulse Readings from Last 3 Encounters:  01/24/24 68  01/03/24 72  12/20/23 64    /10  In general this is a well appearing Caucasian man in no acute distress.  He's alert and oriented x4 and appropriate throughout the examination. Cardiopulmonary assessment is negative for acute distress and the exhibits normal effort.  He has full range of motion in bilateral upper extremities but decreased sensation to light touch and decreased grip strength on the left as compared to the right.  KPS = 80  100 - Normal; no complaints; no evidence of disease. 90   - Able to carry on normal activity; minor signs or symptoms of disease. 80   - Normal activity with effort; some signs or symptoms of disease. 27   - Cares for self; unable to carry on normal activity or to do active work. 60   - Requires occasional assistance, but is able to care for most of his personal needs. 50   - Requires considerable assistance and frequent medical care. 40   - Disabled; requires special care and assistance. 30   - Severely disabled; hospital admission is indicated although death not imminent. 20   - Very sick; hospital admission necessary; active supportive treatment necessary. 10   - Moribund; fatal processes progressing rapidly. 0     - Dead  Karnofsky DA, Abelmann WH, Craver LS and Burchenal JH [phone]) The use of the nitrogen mustards in the palliative treatment of carcinoma: with particular reference to bronchogenic carcinoma Cancer 1 634-56  LABORATORY DATA:  Lab Results  Component Value Date   WBC 4.3 12/13/2023   HGB 13.9 12/13/2023   HCT 42.2 12/13/2023   MCV 97.7 12/13/2023   PLT 219 12/13/2023   Lab Results  Component Value Date   NA 136 12/13/2023   K 5.2 (H) 12/13/2023   CL 100 12/13/2023   CO2 29 12/13/2023   Lab Results  Component Value Date   ALT 20 12/13/2023   AST 35 12/13/2023   ALKPHOS 84 12/13/2023   BILITOT 0.6 12/13/2023      RADIOGRAPHY: MR THORACIC SPINE W WO CONTRAST Result Date: 01/18/2024 CLINICAL DATA:  Severe back pain. History of metastatic prostate cancer. EXAM: MRI CERVICAL AND THORACIC SPINE WITHOUT AND WITH CONTRAST TECHNIQUE: Multiplanar and multiecho pulse sequences of the cervical spine, to include the craniocervical junction and cervicothoracic junction, and the thoracic spine, were obtained without and with intravenous contrast. CONTRAST:  6mL GADAVIST GADOBUTROL 1 MMOL/ML IV SOLN COMPARISON:  PET-CT 10/26/2022 FINDINGS: MRI CERVICAL SPINE FINDINGS Alignment: Trace retrolisthesis of C4 on C5 and C5 on C6. Vertebrae: New widespread T1 hypointense, variably enhancing marrow lesions involving all cervical vertebrae as well as the right occipital condyle. Preserved cervical vertebral body heights without evidence of a fracture. Ventral epidural tumor at T1 extends superiorly to the lower C7 level on the left with involvement of the left C7-T1 neural foramen. No evidence of epidural tumor elsewhere in the cervical spine. Cord: Normal cord signal.  No abnormal intradural enhancement. Posterior Fossa, vertebral arteries, paraspinal tissues: Unremarkable. Disc levels: Moderate disc space narrowing from C3-4 through C6-7. C2-3: Moderate to severe right and mild left facet arthrosis without stenosis. C3-4: Minor disc bulging and uncovertebral spurring. Asymmetric right facet arthrosis  with ankylosis. No significant stenosis. C4-5: Disc bulging, uncovertebral spurring, infolding of the ligamentum flavum, and mild facet arthrosis result in mild spinal stenosis and moderate to severe bilateral neural foraminal stenosis. C5-6: A broad-based posterior disc osteophyte complex and asymmetric left uncovertebral spurring result in mild spinal stenosis with slight cord flattening and severe bilateral neural foraminal stenosis. C6-7: Disc bulging and uncovertebral spurring result in mild-to-moderate spinal stenosis and moderate bilateral  neural foraminal stenosis. C7-T1: Epidural tumor results in mild left-sided spinal stenosis and effacement of the left neural foramen. MRI THORACIC SPINE FINDINGS Alignment:  Normal. Vertebrae: New widespread T1 hypointense, variably enhancing marrow lesions involving all thoracic vertebrae as well as the included upper lumbar spine. Ventral epidural tumor at T1 measures up to 5 mm in thickness and results in moderate spinal stenosis with mild cord mass effect. Tumor effaces the left greater than right T1-2 neural foramina. Known longstanding T11 vertebral metastasis with mild chronic anterior wedging of the T11 vertebral body. L1 inferior endplate Schmorl's node. Partially visualized lesions involving multiple ribs. Cord:  Normal cord signal.  No abnormal intradural enhancement. Paraspinal and other soft tissues: Unremarkable. Disc levels: Spinal stenosis and neural foraminal involvement by tumor at T1-2 as described above. Mild-to-moderate disc degeneration throughout the remainder of the thoracic spine including multiple small central/paracentral disc protrusions. No significant associated spinal stenosis. Mild ventral cord flattening at T7-8 due to a central disc osteophyte complex. IMPRESSION: 1. Widespread osseous metastatic disease throughout the cervical and thoracic spine. 2. 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 No cord edema. 3. No definite acute pathologic fracture. 4. 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 5. Moderate to severe neural foraminal stenosis at C4-5 and C5-6. Electronically Signed   By: Aundra Lee M.D.   On: 01/18/2024 14:41   MR Cervical Spine W Wo Contrast Result Date: 01/18/2024 CLINICAL DATA:  Severe back pain. History of metastatic prostate cancer. EXAM: MRI CERVICAL AND THORACIC SPINE WITHOUT AND WITH CONTRAST TECHNIQUE: Multiplanar and multiecho pulse  sequences of the cervical spine, to include the craniocervical junction and cervicothoracic junction, and the thoracic spine, were obtained without and with intravenous contrast. CONTRAST:  6mL GADAVIST GADOBUTROL 1 MMOL/ML IV SOLN COMPARISON:  PET-CT 10/26/2022 FINDINGS: MRI CERVICAL SPINE FINDINGS Alignment: Trace retrolisthesis of C4 on C5 and C5 on C6. Vertebrae: New widespread T1 hypointense, variably enhancing marrow lesions involving all cervical vertebrae as well as the right occipital condyle. Preserved cervical vertebral body heights without evidence of a fracture. Ventral epidural tumor at T1 extends superiorly to the lower C7 level on the left with involvement of the left C7-T1 neural foramen. No evidence of epidural tumor elsewhere in the cervical spine. Cord: Normal cord signal.  No abnormal intradural enhancement. Posterior Fossa, vertebral arteries, paraspinal tissues: Unremarkable. Disc levels: Moderate disc space narrowing from C3-4 through C6-7. C2-3: Moderate to severe right and mild left facet arthrosis without stenosis. C3-4: Minor disc bulging and uncovertebral spurring. Asymmetric right facet arthrosis with ankylosis. No significant stenosis. C4-5: Disc bulging, uncovertebral spurring, infolding of the ligamentum flavum, and mild facet arthrosis result in mild spinal stenosis and moderate to severe bilateral neural foraminal stenosis. C5-6: A broad-based posterior disc osteophyte complex and asymmetric left uncovertebral spurring result in mild spinal stenosis with slight cord flattening and severe bilateral neural foraminal stenosis. C6-7: Disc bulging and uncovertebral spurring result in mild-to-moderate spinal stenosis and moderate bilateral  neural foraminal stenosis. C7-T1: Epidural tumor results in mild left-sided spinal stenosis and effacement of the left neural foramen. MRI THORACIC SPINE FINDINGS Alignment:  Normal. Vertebrae: New widespread T1 hypointense, variably enhancing marrow  lesions involving all thoracic vertebrae as well as the included upper lumbar spine. Ventral epidural tumor at T1 measures up to 5 mm in thickness and results in moderate spinal stenosis with mild cord mass effect. Tumor effaces the left greater than right T1-2 neural foramina. Known longstanding T11 vertebral metastasis with mild chronic anterior wedging of the T11 vertebral body. L1 inferior endplate Schmorl's node. Partially visualized lesions involving multiple ribs. Cord:  Normal cord signal.  No abnormal intradural enhancement. Paraspinal and other soft tissues: Unremarkable. Disc levels: Spinal stenosis and neural foraminal involvement by tumor at T1-2 as described above. Mild-to-moderate disc degeneration throughout the remainder of the thoracic spine including multiple small central/paracentral disc protrusions. No significant associated spinal stenosis. Mild ventral cord flattening at T7-8 due to a central disc osteophyte complex. IMPRESSION: 1. Widespread osseous metastatic disease throughout the cervical and thoracic spine. 2. 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 No cord edema. 3. No definite acute pathologic fracture. 4. 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 5. Moderate to severe neural foraminal stenosis at C4-5 and C5-6. Electronically Signed   By: Aundra Lee M.D.   On: 01/18/2024 14:41   NM PLUVICTO  ADMINISTRATION Result Date: 01/03/2024 CLINICAL DATA:  Prostate cancer. Castrate resistant prostate cancer with skeletal metastasis. EXAM: NUCLEAR MEDICINE PLUVICTO  INJECTION TECHNIQUE: Infusion: The nuclear medicine technologist and I personally verified the dose activity to be delivered as specified in the written directive, and verified the patient identification via 2 separate methods. Initial flush of the intravenous catheter was performed was sterile saline. The dose  syringe was connected to the catheter and the Lu-177 Pluvicto  administered over a 1 to 10 min infusion. Single 10 cc lushes with normal saline follow the dose. No complications were noted. The entire IV tubing, venocatheter, stopcock and syringes was removed in total, placed in a disposal bag and sent for assay of the residual activity, which will be reported at a later time in our EMR by the physics staff. Pressure was applied to the venipuncture site, and a compression bandage placed. Patient monitored for 1 hour following infusion. Radiation Safety personnel were present to perform the discharge survey, as detailed on their documentation. After a short period of observation, the patient had his IV removed. RADIOPHARMACEUTICALS:  203.9 microcuries Lu-177 PLUVICTO  FINDINGS: Current Infusion: 6 Planned Infusions: 6 Patient presented to nuclear medicine for treatment. The patient's most recent blood counts were reviewed and remains a good candidate to proceed with Lu-177 Pluvicto . PSA continues to decline is now less than 1. Patient reports new fatigue and peripheral neuropathy with numbness and tingling in the arms and hands. Weakness in the legs. The patient was situated in an infusion suite with a contact barrier placed under the arm. Intravenous access was established, using sterile technique, and a normal saline infusion from a syringe was started. Micro-dosimetry: The prescribed radiation activity was assayed and confirmed to be within specified tolerance. IMPRESSION: Current Infusion: 6 Planned Infusions: 6 The patient tolerated the infusion well. The patient will return in 6 to 8 weeks for PSMA PET scan. Electronically Signed   By: Deboraha Fallow M.D.   On: 01/03/2024 16:14      IMPRESSION/PLAN: 1.  73 y.o. 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  Today, we talked to the patient and family about the findings and workup  thus far. We discussed the natural history of metastatic castrate resistant prostate cancer and general treatment, highlighting the role of radiotherapy in the management of painful osseous metastatic disease. We discussed the available radiation techniques, and focused on the details and logistics of delivery.  The recommendation is for a 2-week course of palliative radiotherapy to the cervicothoracic spine at C7-T1 in an effort to preserve neurologic function, provide durable pain control and potentially reverse the current neuropathy.  We reviewed the anticipated acute and late sequelae associated with radiation in this setting. The patient was encouraged to ask questions that were answered to her satisfaction.  At the conclusion of our conversation, the patient is in agreement to proceed with the recommended 2-week course of palliative radiotherapy to the cervicothoracic spine at C7-T1.  He appears to have a good understanding of his disease and our treatment recommendations which are of palliative intent.  He has freely signed written consent to proceed today in the office and a copy of this document will be placed in his medical record.  We will share our discussion with Dr. Katragadda and proceed with CT simulation/treatment planning following our visit today, in anticipation of beginning his treatments on Monday, 01/28/2024.  We enjoyed meeting with him again today and look forward to continuing to participate in his care.  We personally spent 70 minutes in this encounter including chart review, reviewing radiological studies, meeting face-to-face with the patient, entering orders and completing documentation.    Arta Bihari, PA-C    Kenith Payer, MD  [facility]  Radiation Oncology Direct Dial: [phone]  Fax: [phone] [email or web address]  Skype  LinkedIn

## 2024-01-24 NOTE — Telephone Encounter (Signed)
**Note De-identified  [place] Obfuscation** Please advise 

## 2024-01-25 ENCOUNTER — Other Ambulatory Visit (HOSPITAL_COMMUNITY): Payer: Self-pay

## 2024-01-25 DIAGNOSIS — Z510 Encounter for antineoplastic radiation therapy: Secondary | ICD-10-CM | POA: Diagnosis not present

## 2024-01-28 ENCOUNTER — Other Ambulatory Visit: Payer: Self-pay

## 2024-01-28 ENCOUNTER — Ambulatory Visit
Admission: RE | Admit: 2024-01-28 | Discharge: 2024-01-28 | Disposition: A | Discharge status: Home / Self Care | Source: Ambulatory Visit | Attending: Radiation Oncology

## 2024-01-28 ENCOUNTER — Ambulatory Visit
Admission: RE | Admit: 2024-01-28 | Discharge: 2024-01-28 | Discharge status: Home / Self Care | Source: Ambulatory Visit | Attending: Radiation Oncology | Admitting: Radiation Oncology

## 2024-01-28 DIAGNOSIS — Z510 Encounter for antineoplastic radiation therapy: Secondary | ICD-10-CM | POA: Diagnosis not present

## 2024-01-28 DIAGNOSIS — C61 Malignant neoplasm of prostate: Secondary | ICD-10-CM

## 2024-01-28 LAB — RAD ONC ARIA SESSION SUMMARY
Course Elapsed Days: 0
Plan Fractions Treated to Date: 1
Plan Prescribed Dose Per Fraction: 3 Gy
Plan Total Fractions Prescribed: 10
Plan Total Prescribed Dose: 30 Gy
Reference Point Dosage Given to Date: 3 Gy
Reference Point Session Dosage Given: 3 Gy
Session Number: 1

## 2024-01-29 ENCOUNTER — Other Ambulatory Visit: Payer: Self-pay

## 2024-01-29 ENCOUNTER — Ambulatory Visit: Admitting: Neurology

## 2024-01-29 ENCOUNTER — Ambulatory Visit
Admission: RE | Admit: 2024-01-29 | Discharge: 2024-01-29 | Disposition: A | Discharge status: Home / Self Care | Source: Ambulatory Visit | Attending: Radiation Oncology | Admitting: Radiation Oncology

## 2024-01-29 DIAGNOSIS — Z510 Encounter for antineoplastic radiation therapy: Secondary | ICD-10-CM | POA: Diagnosis not present

## 2024-01-29 LAB — RAD ONC ARIA SESSION SUMMARY
Course Elapsed Days: 1
Plan Fractions Treated to Date: 2
Plan Prescribed Dose Per Fraction: 3 Gy
Plan Total Fractions Prescribed: 10
Plan Total Prescribed Dose: 30 Gy
Reference Point Dosage Given to Date: 6 Gy
Reference Point Session Dosage Given: 3 Gy
Session Number: 2

## 2024-01-30 ENCOUNTER — Ambulatory Visit
Admission: RE | Admit: 2024-01-30 | Discharge: 2024-01-30 | Disposition: A | Discharge status: Home / Self Care | Source: Ambulatory Visit | Attending: Radiation Oncology | Admitting: Radiation Oncology

## 2024-01-30 ENCOUNTER — Other Ambulatory Visit: Payer: Self-pay

## 2024-01-30 DIAGNOSIS — Z510 Encounter for antineoplastic radiation therapy: Secondary | ICD-10-CM | POA: Diagnosis not present

## 2024-01-30 LAB — RAD ONC ARIA SESSION SUMMARY
Course Elapsed Days: 2
Plan Fractions Treated to Date: 3
Plan Prescribed Dose Per Fraction: 3 Gy
Plan Total Fractions Prescribed: 10
Plan Total Prescribed Dose: 30 Gy
Reference Point Dosage Given to Date: 9 Gy
Reference Point Session Dosage Given: 3 Gy
Session Number: 3

## 2024-01-31 ENCOUNTER — Other Ambulatory Visit: Payer: Self-pay

## 2024-01-31 ENCOUNTER — Ambulatory Visit
Admission: RE | Admit: 2024-01-31 | Discharge: 2024-01-31 | Disposition: A | Discharge status: Home / Self Care | Source: Ambulatory Visit | Attending: Radiation Oncology | Admitting: Radiation Oncology

## 2024-01-31 DIAGNOSIS — Z510 Encounter for antineoplastic radiation therapy: Secondary | ICD-10-CM | POA: Diagnosis not present

## 2024-01-31 LAB — RAD ONC ARIA SESSION SUMMARY
Course Elapsed Days: 3
Plan Fractions Treated to Date: 4
Plan Prescribed Dose Per Fraction: 3 Gy
Plan Total Fractions Prescribed: 10
Plan Total Prescribed Dose: 30 Gy
Reference Point Dosage Given to Date: 12 Gy
Reference Point Session Dosage Given: 3 Gy
Session Number: 4

## 2024-02-01 ENCOUNTER — Other Ambulatory Visit: Payer: Self-pay

## 2024-02-01 ENCOUNTER — Ambulatory Visit

## 2024-02-01 ENCOUNTER — Ambulatory Visit
Admission: RE | Admit: 2024-02-01 | Discharge: 2024-02-01 | Disposition: A | Discharge status: Home / Self Care | Source: Ambulatory Visit | Attending: Radiation Oncology

## 2024-02-01 DIAGNOSIS — Z510 Encounter for antineoplastic radiation therapy: Secondary | ICD-10-CM | POA: Diagnosis not present

## 2024-02-01 LAB — RAD ONC ARIA SESSION SUMMARY
Course Elapsed Days: 4
Plan Fractions Treated to Date: 5
Plan Prescribed Dose Per Fraction: 3 Gy
Plan Total Fractions Prescribed: 10
Plan Total Prescribed Dose: 30 Gy
Reference Point Dosage Given to Date: 15 Gy
Reference Point Session Dosage Given: 3 Gy
Session Number: 5

## 2024-02-04 ENCOUNTER — Ambulatory Visit
Admission: RE | Admit: 2024-02-04 | Discharge: 2024-02-04 | Disposition: A | Discharge status: Home / Self Care | Source: Ambulatory Visit | Attending: Radiation Oncology | Admitting: Radiation Oncology

## 2024-02-04 ENCOUNTER — Other Ambulatory Visit: Payer: Self-pay

## 2024-02-04 DIAGNOSIS — Z510 Encounter for antineoplastic radiation therapy: Secondary | ICD-10-CM | POA: Diagnosis not present

## 2024-02-04 LAB — RAD ONC ARIA SESSION SUMMARY
Course Elapsed Days: 7
Plan Fractions Treated to Date: 6
Plan Prescribed Dose Per Fraction: 3 Gy
Plan Total Fractions Prescribed: 10
Plan Total Prescribed Dose: 30 Gy
Reference Point Dosage Given to Date: 18 Gy
Reference Point Session Dosage Given: 3 Gy
Session Number: 6

## 2024-02-05 ENCOUNTER — Other Ambulatory Visit: Payer: Self-pay

## 2024-02-05 ENCOUNTER — Ambulatory Visit
Admission: RE | Admit: 2024-02-05 | Discharge: 2024-02-05 | Disposition: A | Discharge status: Home / Self Care | Source: Ambulatory Visit | Attending: Radiation Oncology | Admitting: Radiation Oncology

## 2024-02-05 DIAGNOSIS — C61 Malignant neoplasm of prostate: Secondary | ICD-10-CM | POA: Diagnosis present

## 2024-02-05 DIAGNOSIS — C7951 Secondary malignant neoplasm of bone: Secondary | ICD-10-CM | POA: Insufficient documentation

## 2024-02-05 DIAGNOSIS — Z510 Encounter for antineoplastic radiation therapy: Secondary | ICD-10-CM | POA: Diagnosis present

## 2024-02-05 LAB — RAD ONC ARIA SESSION SUMMARY
Course Elapsed Days: 8
Plan Fractions Treated to Date: 7
Plan Prescribed Dose Per Fraction: 3 Gy
Plan Total Fractions Prescribed: 10
Plan Total Prescribed Dose: 30 Gy
Reference Point Dosage Given to Date: 21 Gy
Reference Point Session Dosage Given: 3 Gy
Session Number: 7

## 2024-02-06 ENCOUNTER — Other Ambulatory Visit: Payer: Self-pay

## 2024-02-06 ENCOUNTER — Ambulatory Visit
Admission: RE | Admit: 2024-02-06 | Discharge: 2024-02-06 | Disposition: A | Discharge status: Home / Self Care | Source: Ambulatory Visit | Attending: Radiation Oncology | Admitting: Radiation Oncology

## 2024-02-06 DIAGNOSIS — Z510 Encounter for antineoplastic radiation therapy: Secondary | ICD-10-CM | POA: Diagnosis not present

## 2024-02-06 LAB — RAD ONC ARIA SESSION SUMMARY
Course Elapsed Days: 9
Plan Fractions Treated to Date: 8
Plan Prescribed Dose Per Fraction: 3 Gy
Plan Total Fractions Prescribed: 10
Plan Total Prescribed Dose: 30 Gy
Reference Point Dosage Given to Date: 24 Gy
Reference Point Session Dosage Given: 3 Gy
Session Number: 8

## 2024-02-07 ENCOUNTER — Other Ambulatory Visit: Payer: Self-pay | Admitting: Hematology

## 2024-02-07 ENCOUNTER — Inpatient Hospital Stay

## 2024-02-07 ENCOUNTER — Ambulatory Visit
Admission: RE | Admit: 2024-02-07 | Discharge: 2024-02-07 | Disposition: A | Discharge status: Home / Self Care | Source: Ambulatory Visit | Attending: Radiation Oncology | Admitting: Radiation Oncology

## 2024-02-07 ENCOUNTER — Encounter (HOSPITAL_COMMUNITY)

## 2024-02-07 ENCOUNTER — Other Ambulatory Visit: Payer: Self-pay

## 2024-02-07 ENCOUNTER — Inpatient Hospital Stay: Attending: Hematology

## 2024-02-07 DIAGNOSIS — Z510 Encounter for antineoplastic radiation therapy: Secondary | ICD-10-CM | POA: Diagnosis not present

## 2024-02-07 DIAGNOSIS — C61 Malignant neoplasm of prostate: Secondary | ICD-10-CM

## 2024-02-07 LAB — RAD ONC ARIA SESSION SUMMARY
Course Elapsed Days: 10
Plan Fractions Treated to Date: 9
Plan Prescribed Dose Per Fraction: 3 Gy
Plan Total Fractions Prescribed: 10
Plan Total Prescribed Dose: 30 Gy
Reference Point Dosage Given to Date: 27 Gy
Reference Point Session Dosage Given: 3 Gy
Session Number: 9

## 2024-02-11 ENCOUNTER — Ambulatory Visit

## 2024-02-11 NOTE — Radiation Completion Notes (Signed)
 Patient Name: Nathan Smith, Nathan Smith MRN: [id number] Date of Birth: [birth year] Referring Physician:  , M.D. Date of Service: 2024-02-11 Radiation Oncologist: Adina Barge, M.D. [facility] [place]                             RADIATION ONCOLOGY END OF TREATMENT NOTE     Diagnosis: C79.51 Secondary malignant neoplasm of bone Staging on 2019-12-26: Prostate cancer (HCC) T=TX, N=N1, M=M1b Intent: Palliative     ==========DELIVERED PLANS==========  First Treatment Date: 2024-01-24 Last Treatment Date: 2024-02-07   Plan Name: Spine_T1 Site: Thoracic Spine Technique: 3D Mode: Photon Dose Per Fraction: 3 Gy Prescribed Dose (Delivered / Prescribed): 27 Gy / 30 Gy Prescribed Fxs (Delivered / Prescribed): 9 / 10     ==========ON TREATMENT VISIT DATES========== 2024-01-28, 2024-02-07     ==========UPCOMING VISITS==========       ==========APPENDIX - ON TREATMENT VISIT NOTES==========   See weekly On Treatment Notes in Epic for details in the Media tab (listed as Progress notes on the On Treatment Visit Dates listed above).

## 2024-02-12 ENCOUNTER — Encounter: Payer: Self-pay | Admitting: Radiation Oncology

## 2024-02-12 ENCOUNTER — Other Ambulatory Visit: Payer: Self-pay | Admitting: Radiation Oncology

## 2024-02-12 ENCOUNTER — Telehealth: Payer: Self-pay

## 2024-02-12 MED ORDER — SUCRALFATE 1 G PO TABS: 1.0000 g | ORAL_TABLET | Freq: Three times a day (TID) | ORAL | 2 refills | Status: DC

## 2024-02-12 NOTE — Telephone Encounter (Signed)
 RN returning call from patient requesting something for painful/difficulty with swallowing per Dr. Patrcia this patient has gone to hospice and to inform him that all medications will be through the hospice team.  Left voicemail call was unanswered.

## 2024-02-14 ENCOUNTER — Inpatient Hospital Stay: Admitting: Hematology

## 2024-02-14 ENCOUNTER — Telehealth: Payer: Self-pay | Admitting: *Deleted

## 2024-02-14 ENCOUNTER — Inpatient Hospital Stay

## 2024-02-14 NOTE — Telephone Encounter (Signed)
 Dr. Katragadda and myself spoke with patient in detail about his prognosis.  Patient was conflicted as to if hospice was a reasonable choice at this point.  Per. Dr. Katragadda, IP Hospice is appropriate for him, as he lives alone and is not functional and able to care for himself while receiving adequate pain control.  Patient agreeable to IP hospice due the decline in health and ability to care for himself while undergoing pain control.  Patient has appointment with [facility] at 1 pm today.  Reached out to [place] with Palliative to advise of the update in discussion.

## 2024-02-15 ENCOUNTER — Other Ambulatory Visit: Payer: Self-pay

## 2024-02-21 ENCOUNTER — Inpatient Hospital Stay

## 2024-02-21 ENCOUNTER — Other Ambulatory Visit (HOSPITAL_COMMUNITY)

## 2024-02-28 ENCOUNTER — Inpatient Hospital Stay: Admitting: Hematology

## 2024-02-28 ENCOUNTER — Inpatient Hospital Stay

## 2024-03-07 DEATH — deceased

## 2024-03-10 ENCOUNTER — Ambulatory Visit: Admitting: Neurology

## 2024-03-11 ENCOUNTER — Encounter
